# Patient Record
Sex: Male | Born: 1940
Health system: Southern US, Community
[De-identification: ages and names within clinical notes are randomized; demographics above are authoritative.]

## PROBLEM LIST (undated history)

## (undated) DIAGNOSIS — E785 Hyperlipidemia, unspecified: Secondary | ICD-10-CM

## (undated) DIAGNOSIS — F329 Major depressive disorder, single episode, unspecified: Secondary | ICD-10-CM

## (undated) DIAGNOSIS — E119 Type 2 diabetes mellitus without complications: Secondary | ICD-10-CM

## (undated) DIAGNOSIS — I4510 Unspecified right bundle-branch block: Secondary | ICD-10-CM

## (undated) DIAGNOSIS — J189 Pneumonia, unspecified organism: Secondary | ICD-10-CM

## (undated) DIAGNOSIS — N400 Enlarged prostate without lower urinary tract symptoms: Secondary | ICD-10-CM

## (undated) DIAGNOSIS — F419 Anxiety disorder, unspecified: Secondary | ICD-10-CM

## (undated) DIAGNOSIS — Z86010 Personal history of colonic polyps: Secondary | ICD-10-CM

## (undated) DIAGNOSIS — R399 Unspecified symptoms and signs involving the genitourinary system: Secondary | ICD-10-CM

## (undated) DIAGNOSIS — F32A Depression, unspecified: Secondary | ICD-10-CM

## (undated) DIAGNOSIS — M1990 Unspecified osteoarthritis, unspecified site: Secondary | ICD-10-CM

## (undated) DIAGNOSIS — G4733 Obstructive sleep apnea (adult) (pediatric): Secondary | ICD-10-CM

## (undated) DIAGNOSIS — K7460 Unspecified cirrhosis of liver: Secondary | ICD-10-CM

## (undated) DIAGNOSIS — I440 Atrioventricular block, first degree: Secondary | ICD-10-CM

## (undated) DIAGNOSIS — I10 Essential (primary) hypertension: Secondary | ICD-10-CM

## (undated) DIAGNOSIS — R351 Nocturia: Secondary | ICD-10-CM

## (undated) DIAGNOSIS — R600 Localized edema: Secondary | ICD-10-CM

## (undated) DIAGNOSIS — Z860101 Personal history of adenomatous and serrated colon polyps: Secondary | ICD-10-CM

## (undated) HISTORY — PX: TOTAL KNEE ARTHROPLASTY: SHX125

## (undated) HISTORY — DX: Essential (primary) hypertension: I10

## (undated) HISTORY — PX: UVULOPALATOPHARYNGOPLASTY: SHX827

## (undated) HISTORY — DX: Depression, unspecified: F32.A

## (undated) HISTORY — PX: SHOULDER ARTHROSCOPY: SHX128

## (undated) HISTORY — PX: PILONIDAL CYST EXCISION: SHX744

## (undated) HISTORY — DX: Hyperlipidemia, unspecified: E78.5

## (undated) HISTORY — PX: APPENDECTOMY: SHX54

## (undated) HISTORY — PX: NASAL SEPTUM SURGERY: SHX37

## (undated) HISTORY — DX: Anxiety disorder, unspecified: F41.9

## (undated) HISTORY — DX: Major depressive disorder, single episode, unspecified: F32.9

---
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
## 2011-12-19 ENCOUNTER — Encounter (HOSPITAL_COMMUNITY): Payer: Self-pay | Admitting: Pharmacy Technician

## 2011-12-27 ENCOUNTER — Ambulatory Visit (HOSPITAL_COMMUNITY)
Admission: RE | Admit: 2011-12-27 | Discharge: 2011-12-27 | Disposition: A | Discharge status: Home / Self Care | Payer: Medicare Other | Source: Ambulatory Visit | Attending: Orthopedic Surgery | Admitting: Orthopedic Surgery

## 2011-12-27 ENCOUNTER — Other Ambulatory Visit: Payer: Self-pay | Admitting: Physician Assistant

## 2011-12-27 ENCOUNTER — Encounter: Payer: Self-pay | Admitting: Physician Assistant

## 2011-12-27 DIAGNOSIS — R52 Pain, unspecified: Secondary | ICD-10-CM

## 2011-12-27 DIAGNOSIS — M7989 Other specified soft tissue disorders: Secondary | ICD-10-CM

## 2011-12-27 DIAGNOSIS — M79609 Pain in unspecified limb: Secondary | ICD-10-CM | POA: Insufficient documentation

## 2011-12-27 DIAGNOSIS — I10 Essential (primary) hypertension: Secondary | ICD-10-CM | POA: Insufficient documentation

## 2011-12-27 DIAGNOSIS — K219 Gastro-esophageal reflux disease without esophagitis: Secondary | ICD-10-CM | POA: Insufficient documentation

## 2011-12-27 DIAGNOSIS — F419 Anxiety disorder, unspecified: Secondary | ICD-10-CM | POA: Insufficient documentation

## 2011-12-27 DIAGNOSIS — G4730 Sleep apnea, unspecified: Secondary | ICD-10-CM | POA: Insufficient documentation

## 2011-12-27 DIAGNOSIS — R609 Edema, unspecified: Secondary | ICD-10-CM

## 2011-12-27 DIAGNOSIS — M1711 Unilateral primary osteoarthritis, right knee: Secondary | ICD-10-CM

## 2011-12-27 DIAGNOSIS — F329 Major depressive disorder, single episode, unspecified: Secondary | ICD-10-CM | POA: Insufficient documentation

## 2011-12-27 DIAGNOSIS — E785 Hyperlipidemia, unspecified: Secondary | ICD-10-CM | POA: Insufficient documentation

## 2011-12-27 DIAGNOSIS — F32A Depression, unspecified: Secondary | ICD-10-CM | POA: Insufficient documentation

## 2011-12-27 NOTE — Progress Notes (Signed)
Right lower extremity venous duplex completed.  Preliminary report is negative for DVT, SVT, or a Baker's cyst in the right leg.  Negative for DVT in the left common femoral vein. 

## 2011-12-27 NOTE — H&P (Signed)
Jared Martinez is an 71 y.o. male.   Chief Complaint: right knee pain and swelling HPI: Jared Martinez is a 60 seen for evaluation of significant right knee pain with known degenerative joint disease. He's been under the care of Dr. Charlett Blake for this but getting progressively worse with increased arthritis in the right knee to the point that it's difficult for him to walk. He has pain on a daily basis. Pain with weightbearing and activity relieved by rest. He has history of venous insufficiency on the right leg with chronic swelling and had a Doppler evaluation on 08/18/11 that showed no evidence of a DVT. He had a left total knee replacement in [place] in 2006.  Today he has significant increase in swelling.  He drove 500 miles yesterday.  No SOB.  Low grade fever.  Past Medical History  Diagnosis Date  . Hyperlipidemia   . Hypertension   . GERD (gastroesophageal reflux disease)   . Anxiety   . Sleep apnea     Does NOT use his CPAP  . Depression     Past Surgical History  Procedure Date  . Palate / uvula biopsy / excision   . Joint replacement 2006    left total knee in [place]  . Appendectomy     Family History  Problem Relation Age of Onset  . Alzheimer's disease Mother   . Cancer Father   . Arthritis Sister   . Arthritis Brother    Social History:  reports that he quit smoking about 38 years ago. He has never used smokeless tobacco. He reports that he drinks alcohol. He reports that he does not use illicit drugs.  Allergies:  Allergies  Allergen Reactions  . Adhesive (Tape)     Medications Prior to Admission  Medication Sig Dispense Refill  . amLODipine (NORVASC) 10 MG tablet Take 10 mg by mouth daily.      Marland Kitchen aspirin EC 81 MG tablet Take 81 mg by mouth daily.      Marland Kitchen atorvastatin (LIPITOR) 20 MG tablet Take 20 mg by mouth daily.      . cetirizine (ZYRTEC) 10 MG tablet Take 10 mg by mouth daily.      . citalopram (CELEXA) 10 MG tablet Take 10 mg by mouth daily.      Marland Kitchen  esomeprazole (NEXIUM) 40 MG capsule Take 40 mg by mouth daily before breakfast.      . lisinopril (PRINIVIL,ZESTRIL) 20 MG tablet Take 20 mg by mouth daily.      . mirtazapine (REMERON) 15 MG tablet Take 15 mg by mouth at bedtime.       No current facility-administered medications on file as of 12/27/2011.    No results found for this or any previous visit (from the past 48 hour(s)). No results found.  Review of Systems  Constitutional: Negative.   HENT: Negative.  Negative for neck pain.   Eyes: Negative for blurred vision, double vision, photophobia, pain, discharge and redness.  Respiratory: Positive for sputum production. Negative for cough, hemoptysis, shortness of breath and wheezing.   Cardiovascular: Positive for leg swelling. Negative for chest pain, palpitations, orthopnea and claudication.       Right leg 3 plus pitting edema on the right positive homan's    Gastrointestinal: Negative for heartburn, nausea, vomiting, abdominal pain, diarrhea, constipation, blood in stool and melena.  Genitourinary: Negative for dysuria, urgency, frequency, hematuria and flank pain.  Musculoskeletal: Positive for joint pain. Negative for myalgias, back pain and falls.  Right knee pain and swelling  Skin: Negative.   Neurological: Negative.  Negative for dizziness, tingling, tremors, sensory change, speech change, focal weakness, seizures and loss of consciousness.  Endo/Heme/Allergies: Negative.   Psychiatric/Behavioral: Negative.     Blood pressure 126/80, pulse 99, temperature 99 F (37.2 C), temperature source Oral, height 5\' 11"  (1.803 m), weight 111.131 kg (245 lb), SpO2 91.00%. Physical Exam  Constitutional: He is oriented to person, place, and time. He appears well-developed and well-nourished.  HENT:  Head: Normocephalic and atraumatic.  Mouth/Throat: Oropharynx is clear and moist.  Eyes: Conjunctivae and EOM are normal. Pupils are equal, round, and reactive to light.  Neck:  Normal range of motion. Neck supple.  Cardiovascular: Normal rate and regular rhythm.   Respiratory: Effort normal and breath sounds normal.  GI: Soft. Bowel sounds are normal.  Genitourinary:       Not pertinent to current symptomatology therefore not examined.  Musculoskeletal: He exhibits edema and tenderness.       Well developed well nourished white male in no acute distress. Alert and oriented. Height 5'11" weight 245 pounds, blood pressure 116/76 pulse is 87. Examination of his right knee reveals moderate varus deformity. 1 to 2+ crepitation 1+ synovitis range of motion 0-120 degrees knee is stable with diffuse pain and normal patella tracking. Exam of the left knee reveals well healed total knee incision without swelling or pain, range of motion 0-115 degrees knee is stable with normal patella tracking. Vascular exam: reveals 1+ pitting edema in the right leg which he says is chronic he has no swelling in the left leg. Pulses are 2+ and symmetric.  Patient has 3 plus pitting edema in his right leg today.  No swelling in his left leg  Neurological: He is alert and oriented to person, place, and time. He has normal reflexes.  Skin: Skin is warm and dry.  Psychiatric: He has a normal mood and affect. His behavior is normal. Judgment and thought content normal.     Assessment Patient Active Problem List  Diagnoses  . Hypertension  . GERD (gastroesophageal reflux disease)  . Hyperlipidemia  . Anxiety  . Sleep apnea  . Depression  . Right knee DJD    Plan  Doppler today to rule out a DVT.  If neg, then plan on total knee replacement on Monday.  I talked to him about this in detail and recommended with his significant persistent pain and findings on x-rays and exam that we proceed with right total knee replacement. Discussed risks benefits and possible complications in detail and he understand this completely. Skilled Nursing Post op  Pascal Lux 12/27/2011, 4:46 PM

## 2011-12-28 ENCOUNTER — Encounter (HOSPITAL_COMMUNITY)
Admission: RE | Admit: 2011-12-28 | Discharge: 2011-12-28 | Disposition: A | Discharge status: Home / Self Care | Payer: Medicare Other | Source: Ambulatory Visit | Attending: Physician Assistant | Admitting: Physician Assistant

## 2011-12-28 ENCOUNTER — Encounter (HOSPITAL_COMMUNITY)
Admission: RE | Admit: 2011-12-28 | Discharge: 2011-12-28 | Disposition: A | Discharge status: Home / Self Care | Payer: Medicare Other | Source: Ambulatory Visit | Attending: Orthopedic Surgery | Admitting: Orthopedic Surgery

## 2011-12-28 ENCOUNTER — Other Ambulatory Visit: Payer: Self-pay

## 2011-12-28 LAB — COMPREHENSIVE METABOLIC PANEL
ALT: 38 U/L (ref 0–53)
AST: 31 U/L (ref 0–37)
Albumin: 4.2 g/dL (ref 3.5–5.2)
Alkaline Phosphatase: 68 U/L (ref 39–117)
BUN: 18 mg/dL (ref 6–23)
CO2: 31 meq/L (ref 19–32)
Calcium: 9.5 mg/dL (ref 8.4–10.5)
Chloride: 102 meq/L (ref 96–112)
Creatinine, Ser: 1.18 mg/dL (ref 0.50–1.35)
GFR calc Af Amer: 69 mL/min — ABNORMAL LOW (ref >90)
GFR calc non Af Amer: 60 mL/min — ABNORMAL LOW (ref >90)
Glucose, Bld: 84 mg/dL (ref 70–99)
Potassium: 4.5 meq/L (ref 3.5–5.1)
Sodium: 140 meq/L (ref 135–145)
Total Bilirubin: 0.5 mg/dL (ref 0.3–1.2)
Total Protein: 7.7 g/dL (ref 6.0–8.3)

## 2011-12-28 LAB — DIFFERENTIAL
Basophils Absolute: 0 10*3/uL (ref 0.0–0.1)
Basophils Relative: 0 % (ref 0–1)
Eosinophils Absolute: 0.2 10*3/uL (ref 0.0–0.7)
Eosinophils Relative: 3 % (ref 0–5)
Lymphocytes Relative: 39 % (ref 12–46)
Lymphs Abs: 2.7 10*3/uL (ref 0.7–4.0)
Monocytes Absolute: 0.7 10*3/uL (ref 0.1–1.0)
Monocytes Relative: 11 % (ref 3–12)
Neutro Abs: 3.2 10*3/uL (ref 1.7–7.7)
Neutrophils Relative %: 47 % (ref 43–77)

## 2011-12-28 LAB — URINALYSIS, ROUTINE W REFLEX MICROSCOPIC
Bilirubin Urine: NEGATIVE
Glucose, UA: NEGATIVE mg/dL
Hgb urine dipstick: NEGATIVE
Ketones, ur: NEGATIVE mg/dL
Nitrite: NEGATIVE
Protein, ur: NEGATIVE mg/dL
Specific Gravity, Urine: 1.018 (ref 1.005–1.030)
Urobilinogen, UA: 1 mg/dL (ref 0.0–1.0)
pH: 7 (ref 5.0–8.0)

## 2011-12-28 LAB — TYPE AND SCREEN
ABO/RH(D): A POS
Antibody Screen: NEGATIVE

## 2011-12-28 LAB — SURGICAL PCR SCREEN
MRSA, PCR: NEGATIVE
Staphylococcus aureus: POSITIVE — AB

## 2011-12-28 LAB — CBC
HCT: 45.3 % (ref 39.0–52.0)
Hemoglobin: 16 g/dL (ref 13.0–17.0)
MCH: 32.6 pg (ref 26.0–34.0)
MCHC: 35.3 g/dL (ref 30.0–36.0)
MCV: 92.3 fL (ref 78.0–100.0)
Platelets: 144 10*3/uL — ABNORMAL LOW (ref 150–400)
RBC: 4.91 MIL/uL (ref 4.22–5.81)
RDW: 12.2 % (ref 11.5–15.5)
WBC: 7 10*3/uL (ref 4.0–10.5)

## 2011-12-28 LAB — URINE MICROSCOPIC-ADD ON

## 2011-12-28 LAB — ABO/RH: ABO/RH(D): A POS

## 2011-12-28 LAB — APTT: aPTT: 26 s (ref 24–37)

## 2011-12-28 LAB — PROTIME-INR
INR: 0.95 (ref 0.00–1.49)
Prothrombin Time: 12.9 s (ref 11.6–15.2)

## 2011-12-28 MED ORDER — CHLORHEXIDINE GLUCONATE 4 % EX LIQD: 60.0000 mL | Freq: Once | CUTANEOUS | Status: DC

## 2011-12-28 NOTE — Progress Notes (Signed)
Chart left for anesthesia review of CXR. 

## 2011-12-28 NOTE — Pre-Procedure Instructions (Signed)
20 MAXTEN SHULER  12/28/2011   Your procedure is scheduled on:  March 18 (Monday)  Report to [facility] at 8:30 AM.  Call this number if you have problems the morning of surgery: [phone]   Remember:   Do not eat food:After Midnight.  May have clear liquids: up to 4 Hours before arrival.  Clear liquids include soda, tea, black coffee, apple or grape juice, broth.  Take these medicines the morning of surgery with A SIP OF WATER: norvasc,zyrtec,celexa,nexium   Do not wear jewelry, make-up or nail polish.  Do not wear lotions, powders, or perfumes. You may wear deodorant.  Do not shave 48 hours prior to surgery.  Do not bring valuables to the hospital.  Contacts, dentures or bridgework may not be worn into surgery.  Leave suitcase in the car. After surgery it may be brought to your room.  For patients admitted to the hospital, checkout time is 11:00 AM the day of discharge.   Patients discharged the day of surgery will not be allowed to drive home.  Name and phone number of your driver: NA  Special Instructions: Incentive Spirometry - Practice and bring it with you on the day of surgery. and CHG Shower Use Special Wash: 1/2 bottle night before surgery and 1/2 bottle morning of surgery.   Please read over the following fact sheets that you were given: Pain Booklet, Blood Transfusion Information, Total Joint Packet, MRSA Information and Surgical Site Infection Prevention

## 2011-12-29 ENCOUNTER — Other Ambulatory Visit: Payer: Self-pay | Admitting: Orthopedic Surgery

## 2011-12-29 DIAGNOSIS — R600 Localized edema: Secondary | ICD-10-CM

## 2011-12-29 LAB — URINE CULTURE
Colony Count: NO GROWTH
Culture  Setup Time: 201303140225
Culture: NO GROWTH

## 2011-12-29 NOTE — Consult Note (Signed)
Anesthesia:  Patient is a 71 year old male scheduled for a right TKR on 01/02/12.  History includes HLD, HTN, GERD, anxiety, OSA without CPAP use s/p UPPP, former smoker, depression, s/p left TKR in '06.  Labs noted.  Urine culture pending.    CXR shows 1. Low lung volumes and bibasilar atelectasis.  Sats 94% at PAT.  No acute respiratory symptoms were reported at his PAT visit.  EKG shows NSR, right BBB.  Currently there are no comparison EKGs available.  No CV symptoms reported at his PAT visit.   If remains asymptomatic, then anticipate he can proceed.

## 2011-12-30 ENCOUNTER — Ambulatory Visit
Admission: RE | Admit: 2011-12-30 | Discharge: 2011-12-30 | Disposition: A | Discharge status: Home / Self Care | Payer: Medicare Other | Source: Ambulatory Visit | Attending: Orthopedic Surgery | Admitting: Orthopedic Surgery

## 2011-12-30 DIAGNOSIS — R600 Localized edema: Secondary | ICD-10-CM

## 2011-12-30 MED ORDER — IOHEXOL 300 MG/ML  SOLN
125.0000 mL | Freq: Once | INTRAMUSCULAR | Status: AC | PRN
  Administered 2011-12-30: 125 mL via INTRAVENOUS

## 2011-12-30 MED ORDER — IOHEXOL 300 MG/ML  SOLN
20.0000 mL | Freq: Once | INTRAMUSCULAR | Status: AC | PRN
  Administered 2011-12-30: 20 mL via ORAL

## 2012-01-01 MED ORDER — CEFAZOLIN SODIUM-DEXTROSE 2-3 GM-% IV SOLR: 2.0000 g | INTRAVENOUS | Status: DC

## 2012-01-02 ENCOUNTER — Inpatient Hospital Stay (HOSPITAL_COMMUNITY)
Admission: RE | Admit: 2012-01-02 | Discharge: 2012-01-05 | DRG: 470 | Disposition: A | Discharge status: Skilled Nursing Facility | Payer: Medicare Other | Source: Ambulatory Visit | Attending: Orthopedic Surgery | Admitting: Orthopedic Surgery

## 2012-01-02 ENCOUNTER — Encounter (HOSPITAL_COMMUNITY): Payer: Self-pay | Admitting: *Deleted

## 2012-01-02 ENCOUNTER — Ambulatory Visit (HOSPITAL_COMMUNITY): Payer: Medicare Other | Admitting: Vascular Surgery

## 2012-01-02 ENCOUNTER — Encounter (HOSPITAL_COMMUNITY): Payer: Self-pay | Admitting: Vascular Surgery

## 2012-01-02 ENCOUNTER — Encounter (HOSPITAL_COMMUNITY)
Admission: RE | Disposition: A | Discharge status: Skilled Nursing Facility | Payer: Self-pay | Source: Ambulatory Visit | Attending: Orthopedic Surgery

## 2012-01-02 DIAGNOSIS — F419 Anxiety disorder, unspecified: Secondary | ICD-10-CM | POA: Insufficient documentation

## 2012-01-02 DIAGNOSIS — Z96659 Presence of unspecified artificial knee joint: Secondary | ICD-10-CM

## 2012-01-02 DIAGNOSIS — I10 Essential (primary) hypertension: Secondary | ICD-10-CM | POA: Diagnosis present

## 2012-01-02 DIAGNOSIS — Z299 Encounter for prophylactic measures, unspecified: Secondary | ICD-10-CM

## 2012-01-02 DIAGNOSIS — G4730 Sleep apnea, unspecified: Secondary | ICD-10-CM | POA: Diagnosis present

## 2012-01-02 DIAGNOSIS — R339 Retention of urine, unspecified: Secondary | ICD-10-CM | POA: Diagnosis not present

## 2012-01-02 DIAGNOSIS — Z87891 Personal history of nicotine dependence: Secondary | ICD-10-CM

## 2012-01-02 DIAGNOSIS — K219 Gastro-esophageal reflux disease without esophagitis: Secondary | ICD-10-CM | POA: Insufficient documentation

## 2012-01-02 DIAGNOSIS — F411 Generalized anxiety disorder: Secondary | ICD-10-CM | POA: Diagnosis present

## 2012-01-02 DIAGNOSIS — E785 Hyperlipidemia, unspecified: Secondary | ICD-10-CM | POA: Diagnosis present

## 2012-01-02 DIAGNOSIS — Z7901 Long term (current) use of anticoagulants: Secondary | ICD-10-CM

## 2012-01-02 DIAGNOSIS — E871 Hypo-osmolality and hyponatremia: Secondary | ICD-10-CM | POA: Diagnosis not present

## 2012-01-02 DIAGNOSIS — M1710 Unilateral primary osteoarthritis, unspecified knee: Principal | ICD-10-CM | POA: Diagnosis present

## 2012-01-02 DIAGNOSIS — N9989 Other postprocedural complications and disorders of genitourinary system: Secondary | ICD-10-CM | POA: Diagnosis not present

## 2012-01-02 DIAGNOSIS — Z8261 Family history of arthritis: Secondary | ICD-10-CM

## 2012-01-02 DIAGNOSIS — D62 Acute posthemorrhagic anemia: Secondary | ICD-10-CM | POA: Diagnosis not present

## 2012-01-02 DIAGNOSIS — Z9109 Other allergy status, other than to drugs and biological substances: Secondary | ICD-10-CM

## 2012-01-02 DIAGNOSIS — F329 Major depressive disorder, single episode, unspecified: Secondary | ICD-10-CM | POA: Diagnosis present

## 2012-01-02 DIAGNOSIS — D696 Thrombocytopenia, unspecified: Secondary | ICD-10-CM | POA: Diagnosis present

## 2012-01-02 DIAGNOSIS — M1711 Unilateral primary osteoarthritis, right knee: Secondary | ICD-10-CM | POA: Insufficient documentation

## 2012-01-02 DIAGNOSIS — Z01812 Encounter for preprocedural laboratory examination: Secondary | ICD-10-CM

## 2012-01-02 DIAGNOSIS — Z809 Family history of malignant neoplasm, unspecified: Secondary | ICD-10-CM

## 2012-01-02 DIAGNOSIS — F3289 Other specified depressive episodes: Secondary | ICD-10-CM | POA: Diagnosis present

## 2012-01-02 DIAGNOSIS — IMO0001 Reserved for inherently not codable concepts without codable children: Secondary | ICD-10-CM

## 2012-01-02 DIAGNOSIS — IMO0002 Reserved for concepts with insufficient information to code with codable children: Secondary | ICD-10-CM | POA: Diagnosis not present

## 2012-01-02 HISTORY — PX: TOTAL KNEE ARTHROPLASTY: SHX125

## 2012-01-02 LAB — GLUCOSE, CAPILLARY
Glucose-Capillary: 148 mg/dL — ABNORMAL HIGH (ref 70–99)
Glucose-Capillary: 120 mg/dL — ABNORMAL HIGH (ref 70–99)

## 2012-01-02 SURGERY — Surgical Case
Anesthesia: *Unknown

## 2012-01-02 SURGERY — ARTHROPLASTY, KNEE, TOTAL
Anesthesia: Regional | Site: Knee | Laterality: Right | Wound class: Clean

## 2012-01-02 MED ORDER — DIAZEPAM 5 MG PO TABS
5.0000 mg | ORAL_TABLET | Freq: Four times a day (QID) | ORAL | Status: DC | PRN
Start: 2012-01-02 — End: 2012-01-05

## 2012-01-02 MED ORDER — CEFUROXIME SODIUM 1.5 G IJ SOLR
INTRAMUSCULAR | Status: DC | PRN
  Administered 2012-01-02: 1.5 g

## 2012-01-02 MED ORDER — MIDAZOLAM HCL 5 MG/5ML IJ SOLN
INTRAMUSCULAR | Status: DC | PRN
  Administered 2012-01-02: 1 mg via INTRAVENOUS

## 2012-01-02 MED ORDER — BISACODYL 5 MG PO TBEC
10.0000 mg | DELAYED_RELEASE_TABLET | Freq: Every day | ORAL | Status: DC
  Administered 2012-01-02 – 2012-01-03 (×2): 10 mg via ORAL
  Filled 2012-01-02 (×3): qty 2

## 2012-01-02 MED ORDER — PHENOL 1.4 % MT LIQD: 1.0000 | OROMUCOSAL | Status: DC | PRN

## 2012-01-02 MED ORDER — BUPIVACAINE-EPINEPHRINE PF 0.5-1:200000 % IJ SOLN
INTRAMUSCULAR | Status: DC | PRN
  Administered 2012-01-02: 30 mL

## 2012-01-02 MED ORDER — FENTANYL CITRATE 0.05 MG/ML IJ SOLN
INTRAMUSCULAR | Status: DC | PRN
  Administered 2012-01-02: 75 ug via INTRAVENOUS
  Administered 2012-01-02: 50 ug via INTRAVENOUS
  Administered 2012-01-02: 25 ug via INTRAVENOUS

## 2012-01-02 MED ORDER — SIMVASTATIN 40 MG PO TABS: 40.0000 mg | ORAL_TABLET | Freq: Every day | ORAL | Status: DC

## 2012-01-02 MED ORDER — ONDANSETRON HCL 4 MG/2ML IJ SOLN
INTRAMUSCULAR | Status: DC | PRN
  Administered 2012-01-02: 4 mg via INTRAVENOUS

## 2012-01-02 MED ORDER — PROPOFOL 10 MG/ML IV EMUL
INTRAVENOUS | Status: DC | PRN
  Administered 2012-01-02: 200 mg via INTRAVENOUS

## 2012-01-02 MED ORDER — ENOXAPARIN SODIUM 30 MG/0.3ML ~~LOC~~ SOLN
30.0000 mg | Freq: Two times a day (BID) | SUBCUTANEOUS | Status: DC
  Administered 2012-01-02: 30 mg via SUBCUTANEOUS
  Filled 2012-01-02 (×3): qty 0.3

## 2012-01-02 MED ORDER — OXYCODONE-ACETAMINOPHEN 5-325 MG PO TABS
1.0000 | ORAL_TABLET | ORAL | Status: DC | PRN
  Administered 2012-01-03 (×2): 2 via ORAL
  Administered 2012-01-04: 1 via ORAL
  Filled 2012-01-02: qty 1
  Filled 2012-01-02 (×2): qty 2

## 2012-01-02 MED ORDER — ONDANSETRON HCL 4 MG/2ML IJ SOLN
4.0000 mg | Freq: Four times a day (QID) | INTRAMUSCULAR | Status: DC | PRN
  Administered 2012-01-03: 4 mg via INTRAVENOUS
  Filled 2012-01-02 (×2): qty 2

## 2012-01-02 MED ORDER — ACETAMINOPHEN 650 MG RE SUPP: 650.0000 mg | Freq: Four times a day (QID) | RECTAL | Status: DC | PRN

## 2012-01-02 MED ORDER — METOCLOPRAMIDE HCL 10 MG PO TABS
5.0000 mg | ORAL_TABLET | Freq: Three times a day (TID) | ORAL | Status: DC | PRN
  Administered 2012-01-04 (×2): 10 mg via ORAL
  Filled 2012-01-02: qty 1

## 2012-01-02 MED ORDER — METOCLOPRAMIDE HCL 5 MG/ML IJ SOLN: 5.0000 mg | Freq: Three times a day (TID) | INTRAMUSCULAR | Status: DC | PRN

## 2012-01-02 MED ORDER — ZOLPIDEM TARTRATE 10 MG PO TABS: 10.0000 mg | ORAL_TABLET | Freq: Every evening | ORAL | Status: DC | PRN

## 2012-01-02 MED ORDER — CEFAZOLIN SODIUM 1-5 GM-% IV SOLN
INTRAVENOUS | Status: DC | PRN
  Administered 2012-01-02: 2 g via INTRAVENOUS

## 2012-01-02 MED ORDER — LACTATED RINGERS IV SOLN: INTRAVENOUS | Status: DC

## 2012-01-02 MED ORDER — EPHEDRINE SULFATE 50 MG/ML IJ SOLN
INTRAMUSCULAR | Status: DC | PRN
  Administered 2012-01-02 (×4): 5 mg via INTRAVENOUS

## 2012-01-02 MED ORDER — PANTOPRAZOLE SODIUM 40 MG PO TBEC
80.0000 mg | DELAYED_RELEASE_TABLET | Freq: Every day | ORAL | Status: DC
  Administered 2012-01-02 – 2012-01-05 (×4): 80 mg via ORAL
  Filled 2012-01-02: qty 2
  Filled 2012-01-02 (×2): qty 1
  Filled 2012-01-02 (×2): qty 2

## 2012-01-02 MED ORDER — HYDROMORPHONE HCL PF 1 MG/ML IJ SOLN
0.2500 mg | INTRAMUSCULAR | Status: DC | PRN
  Administered 2012-01-02: 0.5 mg via INTRAVENOUS

## 2012-01-02 MED ORDER — LIDOCAINE HCL (CARDIAC) 20 MG/ML IV SOLN
INTRAVENOUS | Status: DC | PRN
  Administered 2012-01-02: 50 mg via INTRAVENOUS

## 2012-01-02 MED ORDER — SODIUM CHLORIDE 0.9 % IR SOLN
Status: DC | PRN
  Administered 2012-01-02: 1000 mL

## 2012-01-02 MED ORDER — ZOLPIDEM TARTRATE 5 MG PO TABS: 5.0000 mg | ORAL_TABLET | Freq: Every evening | ORAL | Status: DC | PRN

## 2012-01-02 MED ORDER — LACTATED RINGERS IV SOLN
INTRAVENOUS | Status: DC | PRN
  Administered 2012-01-02 (×2): via INTRAVENOUS

## 2012-01-02 MED ORDER — CEFAZOLIN SODIUM-DEXTROSE 2-3 GM-% IV SOLR
2.0000 g | Freq: Four times a day (QID) | INTRAVENOUS | Status: AC
  Administered 2012-01-02 – 2012-01-03 (×3): 2 g via INTRAVENOUS
  Filled 2012-01-02 (×4): qty 50

## 2012-01-02 MED ORDER — ATORVASTATIN CALCIUM 20 MG PO TABS
20.0000 mg | ORAL_TABLET | Freq: Every day | ORAL | Status: DC
  Administered 2012-01-02 – 2012-01-04 (×3): 20 mg via ORAL
  Filled 2012-01-02 (×4): qty 1

## 2012-01-02 MED ORDER — POTASSIUM CHLORIDE IN NACL 20-0.9 MEQ/L-% IV SOLN
INTRAVENOUS | Status: DC
  Administered 2012-01-02: 21:00:00 via INTRAVENOUS
  Filled 2012-01-02 (×13): qty 1000

## 2012-01-02 MED ORDER — MENTHOL 3 MG MT LOZG: 1.0000 | LOZENGE | OROMUCOSAL | Status: DC | PRN

## 2012-01-02 MED ORDER — AMLODIPINE BESYLATE 10 MG PO TABS
10.0000 mg | ORAL_TABLET | Freq: Every day | ORAL | Status: DC
  Filled 2012-01-02 (×4): qty 1

## 2012-01-02 MED ORDER — DOCUSATE SODIUM 100 MG PO CAPS
100.0000 mg | ORAL_CAPSULE | Freq: Two times a day (BID) | ORAL | Status: DC
  Administered 2012-01-02 – 2012-01-05 (×7): 100 mg via ORAL
  Filled 2012-01-02 (×8): qty 1

## 2012-01-02 MED ORDER — LORATADINE 10 MG PO TABS
10.0000 mg | ORAL_TABLET | Freq: Every day | ORAL | Status: DC
  Administered 2012-01-02 – 2012-01-05 (×4): 10 mg via ORAL
  Filled 2012-01-02 (×4): qty 1

## 2012-01-02 MED ORDER — POVIDONE-IODINE 7.5 % EX SOLN
Freq: Once | CUTANEOUS | Status: DC
  Filled 2012-01-02: qty 118

## 2012-01-02 MED ORDER — CITALOPRAM HYDROBROMIDE 10 MG PO TABS
10.0000 mg | ORAL_TABLET | Freq: Every day | ORAL | Status: DC
  Administered 2012-01-02 – 2012-01-05 (×4): 10 mg via ORAL
  Filled 2012-01-02 (×4): qty 1

## 2012-01-02 MED ORDER — ONDANSETRON HCL 4 MG PO TABS: 4.0000 mg | ORAL_TABLET | Freq: Four times a day (QID) | ORAL | Status: DC | PRN

## 2012-01-02 MED ORDER — MIRTAZAPINE 15 MG PO TABS
15.0000 mg | ORAL_TABLET | Freq: Every day | ORAL | Status: DC
  Administered 2012-01-02 – 2012-01-04 (×3): 15 mg via ORAL
  Filled 2012-01-02 (×4): qty 1

## 2012-01-02 MED ORDER — ACETAMINOPHEN 325 MG PO TABS: 650.0000 mg | ORAL_TABLET | Freq: Four times a day (QID) | ORAL | Status: DC | PRN

## 2012-01-02 MED ORDER — ACETAMINOPHEN 10 MG/ML IV SOLN
INTRAVENOUS | Status: DC | PRN
  Administered 2012-01-02: 1000 mg via INTRAVENOUS

## 2012-01-02 MED ORDER — HYDROMORPHONE HCL PF 1 MG/ML IJ SOLN
0.5000 mg | INTRAMUSCULAR | Status: DC | PRN
  Administered 2012-01-03: 1 mg via INTRAVENOUS
  Filled 2012-01-02: qty 1

## 2012-01-02 MED ORDER — LISINOPRIL 20 MG PO TABS
20.0000 mg | ORAL_TABLET | Freq: Every day | ORAL | Status: DC
  Filled 2012-01-02 (×4): qty 1

## 2012-01-02 MED ORDER — OXYCODONE HCL 5 MG PO TABS
5.0000 mg | ORAL_TABLET | ORAL | Status: DC | PRN
  Administered 2012-01-02 – 2012-01-03 (×4): 10 mg via ORAL
  Filled 2012-01-02 (×4): qty 2

## 2012-01-02 MED ORDER — ACETAMINOPHEN 10 MG/ML IV SOLN
INTRAVENOUS | Status: AC
  Filled 2012-01-02: qty 100

## 2012-01-02 SURGICAL SUPPLY — 74 items
BANDAGE ELASTIC 6 VELCRO ST LF (GAUZE/BANDAGES/DRESSINGS) ×2 IMPLANT
BANDAGE ESMARK 6X9 LF (GAUZE/BANDAGES/DRESSINGS) ×1 IMPLANT
BLADE SAGITTAL 25.0X1.19X90 (BLADE) ×2 IMPLANT
BLADE SAW SGTL 11.0X1.19X90.0M (BLADE) IMPLANT
BLADE SAW SGTL 13.0X1.19X90.0M (BLADE) ×2 IMPLANT
BLADE SURG 10 STRL SS (BLADE) ×4 IMPLANT
BNDG CMPR 9X6 STRL LF SNTH (GAUZE/BANDAGES/DRESSINGS) ×1
BNDG CMPR MED 15X6 ELC VLCR LF (GAUZE/BANDAGES/DRESSINGS) ×1
BNDG ELASTIC 6X15 VLCR STRL LF (GAUZE/BANDAGES/DRESSINGS) ×2 IMPLANT
BNDG ESMARK 6X9 LF (GAUZE/BANDAGES/DRESSINGS) ×2
BOWL SMART MIX CTS (DISPOSABLE) ×2 IMPLANT
CEMENT HV SMART SET (Cement) ×4 IMPLANT
CLOTH BEACON ORANGE TIMEOUT ST (SAFETY) ×2 IMPLANT
COVER BACK TABLE 24X17X13 BIG (DRAPES) IMPLANT
COVER PROBE W GEL 5X96 (DRAPES) IMPLANT
COVER SURGICAL LIGHT HANDLE (MISCELLANEOUS) ×2 IMPLANT
CUFF TOURNIQUET SINGLE 34IN LL (TOURNIQUET CUFF) ×2 IMPLANT
CUFF TOURNIQUET SINGLE 44IN (TOURNIQUET CUFF) IMPLANT
DRAPE EXTREMITY T 121X128X90 (DRAPE) ×2 IMPLANT
DRAPE INCISE IOBAN 66X45 STRL (DRAPES) ×2 IMPLANT
DRAPE PROXIMA HALF (DRAPES) ×2 IMPLANT
DRAPE U-SHAPE 47X51 STRL (DRAPES) ×2 IMPLANT
DRSG ADAPTIC 3X8 NADH LF (GAUZE/BANDAGES/DRESSINGS) ×2 IMPLANT
DRSG PAD ABDOMINAL 8X10 ST (GAUZE/BANDAGES/DRESSINGS) ×2 IMPLANT
DURAPREP 26ML APPLICATOR (WOUND CARE) ×2 IMPLANT
ELECT CAUTERY BLADE 6.4 (BLADE) ×2 IMPLANT
ELECT REM PT RETURN 9FT ADLT (ELECTROSURGICAL) ×2
ELECTRODE REM PT RTRN 9FT ADLT (ELECTROSURGICAL) ×1 IMPLANT
EVACUATOR 1/8 PVC DRAIN (DRAIN) ×2 IMPLANT
FACESHIELD LNG OPTICON STERILE (SAFETY) ×2 IMPLANT
GLOVE BIO SURGEON STRL SZ7 (GLOVE) IMPLANT
GLOVE BIOGEL PI IND STRL 7.0 (GLOVE) ×1 IMPLANT
GLOVE BIOGEL PI IND STRL 7.5 (GLOVE) ×1 IMPLANT
GLOVE BIOGEL PI INDICATOR 7.0 (GLOVE) ×1
GLOVE BIOGEL PI INDICATOR 7.5 (GLOVE) ×1
GLOVE SS BIOGEL STRL SZ 7.5 (GLOVE) IMPLANT
GLOVE SUPERSENSE BIOGEL SZ 7.5 (GLOVE)
GLOVE SURG SS PI 7.0 STRL IVOR (GLOVE) ×8 IMPLANT
GLOVE SURG SS PI 7.5 STRL IVOR (GLOVE) ×6 IMPLANT
GLOVE SURG SS PI 8.0 STRL IVOR (GLOVE) ×4 IMPLANT
GOWN PREVENTION PLUS XLARGE (GOWN DISPOSABLE) ×6 IMPLANT
GOWN STRL NON-REIN LRG LVL3 (GOWN DISPOSABLE) ×4 IMPLANT
HANDPIECE INTERPULSE COAX TIP (DISPOSABLE) ×2
HOOD PEEL AWAY FACE SHEILD DIS (HOOD) ×4 IMPLANT
IMMOBILIZER KNEE 22 UNIV (SOFTGOODS) IMPLANT
INSERT CUSHION PRONEVIEW LG (MISCELLANEOUS) IMPLANT
KIT BASIN OR (CUSTOM PROCEDURE TRAY) ×2 IMPLANT
KIT ROOM TURNOVER OR (KITS) ×2 IMPLANT
MANIFOLD NEPTUNE II (INSTRUMENTS) ×2 IMPLANT
NS IRRIG 1000ML POUR BTL (IV SOLUTION) ×2 IMPLANT
PACK TOTAL JOINT (CUSTOM PROCEDURE TRAY) ×2 IMPLANT
PAD ARMBOARD 7.5X6 YLW CONV (MISCELLANEOUS) ×4 IMPLANT
PAD CAST 4YDX4 CTTN HI CHSV (CAST SUPPLIES) ×1 IMPLANT
PADDING CAST COTTON 4X4 STRL (CAST SUPPLIES) ×2
PADDING CAST COTTON 6X4 STRL (CAST SUPPLIES) ×2 IMPLANT
POSITIONER HEAD PRONE TRACH (MISCELLANEOUS) ×2 IMPLANT
RUBBERBAND STERILE (MISCELLANEOUS) ×2 IMPLANT
SET HNDPC FAN SPRY TIP SCT (DISPOSABLE) ×1 IMPLANT
SPONGE GAUZE 4X4 12PLY (GAUZE/BANDAGES/DRESSINGS) ×2 IMPLANT
STRIP CLOSURE SKIN 1/2X4 (GAUZE/BANDAGES/DRESSINGS) ×2 IMPLANT
SUCTION FRAZIER TIP 10 FR DISP (SUCTIONS) ×2 IMPLANT
SUT ETHIBOND NAB CT1 #1 30IN (SUTURE) ×4 IMPLANT
SUT MNCRL AB 3-0 PS2 18 (SUTURE) ×2 IMPLANT
SUT VIC AB 0 CT1 27 (SUTURE) ×4
SUT VIC AB 0 CT1 27XBRD ANBCTR (SUTURE) ×2 IMPLANT
SUT VIC AB 2-0 CT1 27 (SUTURE) ×4
SUT VIC AB 2-0 CT1 TAPERPNT 27 (SUTURE) ×2 IMPLANT
SYR 30ML SLIP (SYRINGE) ×2 IMPLANT
TOWEL OR 17X24 6PK STRL BLUE (TOWEL DISPOSABLE) ×2 IMPLANT
TOWEL OR 17X26 10 PK STRL BLUE (TOWEL DISPOSABLE) ×2 IMPLANT
TRAY FOLEY BAG SILVER LF 14FR (CATHETERS) ×2 IMPLANT
TRAY FOLEY CATH 14FR (SET/KITS/TRAYS/PACK) ×2 IMPLANT
WATER STERILE IRR 1000ML POUR (IV SOLUTION) ×6 IMPLANT
YANKAUER SUCT BULB TIP NO VENT (SUCTIONS) ×2 IMPLANT

## 2012-01-02 NOTE — Anesthesia Procedure Notes (Addendum)
Anesthesia Regional Block:  Femoral nerve block  Pre-Anesthetic Checklist: ,, timeout performed, Correct Patient, Correct Site, Correct Laterality, Correct Procedure, Correct Position, site marked, Risks and benefits discussed, pre-op evaluation,  At surgeon's request and post-op pain management  Laterality: Right  Prep: Maximum Sterile Barrier Precautions used and chloraprep       Needles:  Injection technique: Single-shot  Needle Type: Echogenic Stimulator Needle      Needle Gauge: 22 and 22 G    Additional Needles:  Procedures: nerve stimulator Femoral nerve block  Nerve Stimulator or Paresthesia:  Response: Patellar respose, 0.4 mA,   Additional Responses:   Narrative:  Start time: 01/02/2012 8:55 AM End time: 01/02/2012 9:05 AM Injection made incrementally with aspirations every 5 mL. Anesthesiologist: Fitzgerald,MD  Additional Notes: 2% Lidocaine skin wheel.   Femoral nerve block Procedure Name: LMA Insertion Date/Time: 01/02/2012 9:42 AM Performed by: Glendora Score A Pre-anesthesia Checklist: Patient identified, Emergency Drugs available, Suction available and Patient being monitored Patient Re-evaluated:Patient Re-evaluated prior to inductionOxygen Delivery Method: Circle system utilized Preoxygenation: Pre-oxygenation with 100% oxygen Intubation Type: IV induction LMA: LMA with gastric port inserted LMA Size: 5.0 Placement Confirmation: positive ETCO2 and breath sounds checked- equal and bilateral Tube secured with: Tape Dental Injury: Teeth and Oropharynx as per pre-operative assessment

## 2012-01-02 NOTE — Anesthesia Postprocedure Evaluation (Signed)
  Anesthesia Post-op Note  Patient: Jared Martinez  Procedure(s) Performed: Procedure(s) (LRB): TOTAL KNEE ARTHROPLASTY (Right)  Patient Location: PACU  Anesthesia Type: GA combined with regional for post-op pain  Level of Consciousness: awake and alert   Airway and Oxygen Therapy: Patient Spontanous Breathing and Patient connected to nasal cannula oxygen  Post-op Pain: moderate  Post-op Assessment: Post-op Vital signs reviewed, Patient's Cardiovascular Status Stable, Respiratory Function Stable, Patent Airway and No signs of Nausea or vomiting  Post-op Vital Signs: Reviewed and stable  Complications: No apparent anesthesia complications

## 2012-01-02 NOTE — Preoperative (Signed)
Beta Blockers   Reason not to administer Beta Blockers:Not Applicable 

## 2012-01-02 NOTE — Progress Notes (Signed)
Orthopedic Tech Progress Note Patient Details:  Jared Martinez 06/15/1941 [id number]  Patient ID: Jared Martinez, male   DOB: 12-08-1940, 71 y.o.   MRN: [id number]  Applied overhead frame to pt bed.  Tavoris Brisk T 01/02/2012, 4:07 PM

## 2012-01-02 NOTE — Anesthesia Preprocedure Evaluation (Addendum)
Anesthesia Evaluation  Patient identified by MRN, date of birth, ID band Patient awake    Reviewed: Allergy & Precautions, H&P , NPO status , Patient's Chart, lab work & pertinent test results, reviewed documented beta blocker date and time   History of Anesthesia Complications Negative for: history of anesthetic complications  Airway Mallampati: III TM Distance: >3 FB Neck ROM: full    Dental No notable dental hx. (+) Teeth Intact   Pulmonary sleep apnea ,  breath sounds clear to auscultation  Pulmonary exam normal       Cardiovascular Exercise Tolerance: Good hypertension, On Medications Rhythm:regular Rate:Normal     Neuro/Psych PSYCHIATRIC DISORDERS negative neurological ROS  negative psych ROS   GI/Hepatic Neg liver ROS, GERD-  Controlled and Medicated,  Endo/Other  negative endocrine ROS  Renal/GU negative Renal ROS  negative genitourinary   Musculoskeletal   Abdominal (+) + obese,   Peds  Hematology negative hematology ROS (+)   Anesthesia Other Findings   Reproductive/Obstetrics negative OB ROS                          Anesthesia Physical Anesthesia Plan  ASA: III  Anesthesia Plan: General ETT and Regional   Post-op Pain Management:    Induction: Intravenous  Airway Management Planned: LMA  Additional Equipment:   Intra-op Plan:   Post-operative Plan: Extubation in OR  Informed Consent: I have reviewed the patients History and Physical, chart, labs and discussed the procedure including the risks, benefits and alternatives for the proposed anesthesia with the patient or authorized representative who has indicated his/her understanding and acceptance.   Dental Advisory Given  Plan Discussed with: CRNA and Surgeon  Anesthesia Plan Comments:        Anesthesia Quick Evaluation

## 2012-01-02 NOTE — Brief Op Note (Signed)
01/02/2012  11:20 AM  PATIENT:  Jared Martinez  71 y.o. male  PRE-OPERATIVE DIAGNOSIS:  DJD RT KNEE  POST-OPERATIVE DIAGNOSIS:  DJD RT KNEE  PROCEDURE:  Procedure(s) (LRB): TOTAL KNEE ARTHROPLASTY (Right)  SURGEON:  Surgeon(s) and Role:    * Nilda Simmer, MD - Primary  PHYSICIAN ASSISTANT: Julien Girt PA_C  ASSISTANTS: Kirstin Shepperson PA_C   ANESTHESIA:   general  EBL:  Total I/O In: 1000 [I.V.:1000] Out: -   BLOOD ADMINISTERED:none  DRAINS: (2) Hemovact drain(s) in the right knee with  Suction Clamped   LOCAL MEDICATIONS USED:  NONE  SPECIMEN:  No Specimen  DISPOSITION OF SPECIMEN:  N/A  COUNTS:  YES  TOURNIQUET:   Total Tourniquet Time Documented: Thigh (Right) - 62 minutes  DICTATION: .Other Dictation: Dictation Number [id number]  PLAN OF CARE: Admit to inpatient   PATIENT DISPOSITION:  PACU - hemodynamically stable.   Delay start of Pharmacological VTE agent (>24hrs) due to surgical blood loss or risk of bleeding: no

## 2012-01-02 NOTE — Transfer of Care (Signed)
Immediate Anesthesia Transfer of Care Note  Patient: Jared Martinez  Procedure(s) Performed: Procedure(s) (LRB): TOTAL KNEE ARTHROPLASTY (Right)  Patient Location: PACU  Anesthesia Type: General  Level of Consciousness: awake and alert   Airway & Oxygen Therapy: Patient Spontanous Breathing and Patient connected to face mask oxygen  Post-op Assessment: Report given to PACU RN  Post vital signs: Reviewed and stable  Complications: No apparent anesthesia complications

## 2012-01-02 NOTE — Progress Notes (Signed)
Orthopedic Tech Progress Note Patient Details:  Jared Martinez Sep 17, 1941 [id number]  CPM Right Knee CPM Right Knee: On Right Knee Flexion (Degrees): 90  Right Knee Extension (Degrees): 0    Cammer, Mickie Bail 01/02/2012, 12:27 PM

## 2012-01-02 NOTE — Interval H&P Note (Signed)
History and Physical Interval Note:  01/02/2012 9:23 AM  Jared Martinez  has presented today for surgery, with the diagnosis of DJD RT KNEE  The various methods of treatment have been discussed with the patient and family. After consideration of risks, benefits and other options for treatment, the patient has consented to  Procedure(s) (LRB): TOTAL KNEE ARTHROPLASTY (Right) as a surgical intervention .  The patients' history has been reviewed, patient examined, no change in status, stable for surgery.  I have reviewed the patients' chart and labs.  Questions were answered to the patient's satisfaction.     Salvatore Marvel A

## 2012-01-02 NOTE — Op Note (Signed)
NAME:  Jared Martinez, Jared Martinez NO.:  [id number]  MEDICAL RECORD NO.:  [id number]  LOCATION:  5037                         FACILITY:  [facility]  PHYSICIAN:  Elana Alm. Thurston Hole, M.D. DATE OF BIRTH:  June 05, 1941  DATE OF PROCEDURE:  01/02/2012 DATE OF DISCHARGE:                              OPERATIVE REPORT   PREOPERATIVE DIAGNOSIS:  Right knee degenerative joint disease.  POSTOPERATIVE DIAGNOSIS:  Right knee degenerative joint disease.  PROCEDURE: 1. Right total knee replacement using DePuy cemented total knee system     with 4 cemented femur, #5 cemented tibia with 12.5 mm polyethylene     RP tibial spacer, and 35 mm polyethylene cemented patella. 2. Zinacef impregnated cement.  SURGEON:  Elana Alm. Thurston Hole, M.D.  ASSISTANT:  Kirstin Shepperson, PA-C.  ANESTHESIA:  General.  OPERATIVE TIME:  1 hour and 20 minutes.  COMPLICATIONS:  None.  DESCRIPTION OF PROCEDURE:  Mr. Hands was brought to the operating room on January 02, 2012, after a femoral nerve block was placed in the holding by Anesthesia.  He was placed on operative table in supine position.  He received Ancef 2 g IV preoperatively for prophylaxis.  After being placed under general anesthesia, he had a Foley catheter placed under sterile conditions.  His right knee was examined.  Range of motion from -5 to 125 degrees, moderate varus deformity, knee stable, ligamentous exam with normal patellar tracking.  The right leg was prepped using sterile DuraPrep and draped using sterile technique.  Time-out procedure was called and the correct right knee identified.  Latex precautions were used to a questionable latex allergy.  The right leg was exsanguinated and a thigh tourniquet elevated at 365 mm.  Initially, through a 15-cm longitudinal incision based over the patella, initial exposure was made.  The underlying subcutaneous tissues were incised along with skin incision.  A median arthrotomy was performed  revealing excessive amount of normal-appearing joint fluid.  The articular surfaces were inspected.  He had grade 4 changes medially, grade 3 changes laterally, and grade 3 and 4 changes in the patellofemoral joint.  Osteophytes were removed from the femoral condyles and tibial plateau.  The medial and lateral meniscal remnants were removed as well as the anterior cruciate ligament.  Intramedullary drill was then drilled up the femoral canal for placement of distal femoral cutting jig, which was placed in the appropriate manner of rotation and a distal 11 mm cut was made.  The distal femur was then sized.  A #4 was found to be the appropriate size.  A #4 cutting jig was placed in the appropriate manner of external rotation and then these cuts were made.  The proximal tibia was then exposed.  The tibial spines were removed with an oscillating saw.  Intramedullary drill was drilled down the tibial canal for placement of proximal tibial cutting jig, which was placed in the appropriate manner of rotation and a proximal 5 mm cut was made based off the medial or lower side.  Spacer blocks were then placed in flexion and extension.  12.5 mm blocks gave excellent balancing, excellent stability, and excellent correction of his flexion and varus deformities.  At this point, #5  tibial baseplate trial was placed on the cut tibial surface with an excellent fit and a keel cut was made.  The PCL box cutter was then placed on the distal femur and these cuts were made.  At this point, #4 femoral trial was placed and with a #5 tibial baseplate trial and a 12.5 mm polyethylene RP tibial spacer, knee was reduced, taken through range of motion from 0 to 125 degrees with excellent stability and excellent correction of his flexion and varus deformities and normal patellar tracking.  A resurfacing 9 mm cut was made on the patella and 3 locking holes placed for a 35 mm polyethylene patellar trial and again,  patellofemoral tracking was evaluated and found to be normal.  At this point, it was felt that all the trial components were of excellent size, fit, and stability.  They were then removed.  The knee was then jet lavage irrigated with 3 L of saline. The proximal tibia was then exposed, #5 tibial baseplate with Zinacef impregnated cement was hammered in position with an excellent fit with excess cement being removed from around the edges.  #4 femoral component with cement backing was hammered in position also with an excellent fit with excess cement being removed from around the edges.  The 12.5 mm polyethylene RP tibial spacer was placed on tibial baseplate and the knee reduced, taken through range of motion from 0 to 125 degrees with excellent stability and excellent correction of his flexion and varus deformities.  The 35 mm polyethylene cement backed patella was then placed in its position and held there with a clamp.  After the cement hardened, again patellofemoral tracking was evaluated and found to be normal.  At this point, it was felt that all the components were of excellent size, fit, and stability.  The wound was further irrigated with saline.  Tourniquet was released.  Hemostasis obtained with cautery.  The arthrotomy was then closed with 0 Quill running suture. Subcutaneous tissues closed with 0 and 2-0 Vicryl, subcuticular layer closed with 4-0 Monocryl.  Sterile dressings were applied and a long-leg splint.  The patient awakened, extubated, and taken to recovery room in stable condition.  Needle, sponge counts were correct x2 at the end of the case.  Neurovascular status normal.  Pulses 2+ and symmetric.     Jemel Ono A. Thurston Hole, M.D.     RAW/MEDQ  D:  01/02/2012  T:  01/02/2012  Job:  [id number]

## 2012-01-02 NOTE — H&P (View-Only) (Signed)
Jared Martinez is an 71 y.o. male.   Chief Complaint: right knee pain and swelling HPI: Jared Martinez is a 71 seen for evaluation of significant right knee pain with known degenerative joint disease. He's been under the care of Dr. Voytek for this but getting progressively worse with increased arthritis in the right knee to the point that it's difficult for him to walk. He has pain on a daily basis. Pain with weightbearing and activity relieved by rest. He has history of venous insufficiency on the right leg with chronic swelling and had a Doppler evaluation on 08/18/11 that showed no evidence of a DVT. He had a left total knee replacement in [place] in 2006.  Today he has significant increase in swelling.  He drove 500 miles yesterday.  No SOB.  Low grade fever.  Past Medical History  Diagnosis Date  . Hyperlipidemia   . Hypertension   . GERD (gastroesophageal reflux disease)   . Anxiety   . Sleep apnea     Does NOT use his CPAP  . Depression     Past Surgical History  Procedure Date  . Palate / uvula biopsy / excision   . Joint replacement 2006    left total knee in [place]  . Appendectomy     Family History  Problem Relation Age of Onset  . Alzheimer's disease Mother   . Cancer Father   . Arthritis Sister   . Arthritis Brother    Social History:  reports that he quit smoking about 38 years ago. He has never used smokeless tobacco. He reports that he drinks alcohol. He reports that he does not use illicit drugs.  Allergies:  Allergies  Allergen Reactions  . Adhesive (Tape)     Medications Prior to Admission  Medication Sig Dispense Refill  . amLODipine (NORVASC) 10 MG tablet Take 10 mg by mouth daily.      . aspirin EC 81 MG tablet Take 81 mg by mouth daily.      . atorvastatin (LIPITOR) 20 MG tablet Take 20 mg by mouth daily.      . cetirizine (ZYRTEC) 10 MG tablet Take 10 mg by mouth daily.      . citalopram (CELEXA) 10 MG tablet Take 10 mg by mouth daily.      .  esomeprazole (NEXIUM) 40 MG capsule Take 40 mg by mouth daily before breakfast.      . lisinopril (PRINIVIL,ZESTRIL) 20 MG tablet Take 20 mg by mouth daily.      . mirtazapine (REMERON) 15 MG tablet Take 15 mg by mouth at bedtime.       No current facility-administered medications on file as of 12/27/2011.    No results found for this or any previous visit (from the past 48 hour(s)). No results found.  Review of Systems  Constitutional: Negative.   HENT: Negative.  Negative for neck pain.   Eyes: Negative for blurred vision, double vision, photophobia, pain, discharge and redness.  Respiratory: Positive for sputum production. Negative for cough, hemoptysis, shortness of breath and wheezing.   Cardiovascular: Positive for leg swelling. Negative for chest pain, palpitations, orthopnea and claudication.       Right leg 3 plus pitting edema on the right positive homan's    Gastrointestinal: Negative for heartburn, nausea, vomiting, abdominal pain, diarrhea, constipation, blood in stool and melena.  Genitourinary: Negative for dysuria, urgency, frequency, hematuria and flank pain.  Musculoskeletal: Positive for joint pain. Negative for myalgias, back pain and falls.         Right knee pain and swelling  Skin: Negative.   Neurological: Negative.  Negative for dizziness, tingling, tremors, sensory change, speech change, focal weakness, seizures and loss of consciousness.  Endo/Heme/Allergies: Negative.   Psychiatric/Behavioral: Negative.     Blood pressure 126/80, pulse 99, temperature 99 F (37.2 C), temperature source Oral, height 5' 11" (1.803 m), weight 111.131 kg (245 lb), SpO2 91.00%. Physical Exam  Constitutional: He is oriented to person, place, and time. He appears well-developed and well-nourished.  HENT:  Head: Normocephalic and atraumatic.  Mouth/Throat: Oropharynx is clear and moist.  Eyes: Conjunctivae and EOM are normal. Pupils are equal, round, and reactive to light.  Neck:  Normal range of motion. Neck supple.  Cardiovascular: Normal rate and regular rhythm.   Respiratory: Effort normal and breath sounds normal.  GI: Soft. Bowel sounds are normal.  Genitourinary:       Not pertinent to current symptomatology therefore not examined.  Musculoskeletal: He exhibits edema and tenderness.       Well developed well nourished white male in no acute distress. Alert and oriented. Height 5'11" weight 245 pounds, blood pressure 116/76 pulse is 87. Examination of his right knee reveals moderate varus deformity. 1 to 2+ crepitation 1+ synovitis range of motion 0-120 degrees knee is stable with diffuse pain and normal patella tracking. Exam of the left knee reveals well healed total knee incision without swelling or pain, range of motion 0-115 degrees knee is stable with normal patella tracking. Vascular exam: reveals 1+ pitting edema in the right leg which he says is chronic he has no swelling in the left leg. Pulses are 2+ and symmetric.  Patient has 3 plus pitting edema in his right leg today.  No swelling in his left leg  Neurological: He is alert and oriented to person, place, and time. He has normal reflexes.  Skin: Skin is warm and dry.  Psychiatric: He has a normal mood and affect. His behavior is normal. Judgment and thought content normal.     Assessment Patient Active Problem List  Diagnoses  . Hypertension  . GERD (gastroesophageal reflux disease)  . Hyperlipidemia  . Anxiety  . Sleep apnea  . Depression  . Right knee DJD    Plan  Doppler today to rule out a DVT.  If neg, then plan on total knee replacement on Monday.  I talked to him about this in detail and recommended with his significant persistent pain and findings on x-rays and exam that we proceed with right total knee replacement. Discussed risks benefits and possible complications in detail and he understand this completely. Skilled Nursing Post op  Jolene Guyett J 12/27/2011, 4:46 PM    

## 2012-01-03 ENCOUNTER — Encounter (HOSPITAL_COMMUNITY): Payer: Self-pay | Admitting: Orthopedic Surgery

## 2012-01-03 LAB — CBC
HCT: 39.5 % (ref 39.0–52.0)
Hemoglobin: 13.4 g/dL (ref 13.0–17.0)
MCH: 32.1 pg (ref 26.0–34.0)
MCHC: 33.9 g/dL (ref 30.0–36.0)
MCV: 94.7 fL (ref 78.0–100.0)
Platelets: 128 10*3/uL — ABNORMAL LOW (ref 150–400)
RBC: 4.17 MIL/uL — ABNORMAL LOW (ref 4.22–5.81)
RDW: 12.1 % (ref 11.5–15.5)
WBC: 8.4 10*3/uL (ref 4.0–10.5)

## 2012-01-03 LAB — GLUCOSE, CAPILLARY
Glucose-Capillary: 148 mg/dL — ABNORMAL HIGH (ref 70–99)
Glucose-Capillary: 133 mg/dL — ABNORMAL HIGH (ref 70–99)
Glucose-Capillary: 160 mg/dL — ABNORMAL HIGH (ref 70–99)
Glucose-Capillary: 136 mg/dL — ABNORMAL HIGH (ref 70–99)

## 2012-01-03 LAB — BASIC METABOLIC PANEL
BUN: 15 mg/dL (ref 6–23)
CO2: 27 meq/L (ref 19–32)
Calcium: 8.4 mg/dL (ref 8.4–10.5)
Chloride: 99 meq/L (ref 96–112)
Creatinine, Ser: 1.17 mg/dL (ref 0.50–1.35)
GFR calc Af Amer: 70 mL/min — ABNORMAL LOW (ref >90)
GFR calc non Af Amer: 60 mL/min — ABNORMAL LOW (ref >90)
Glucose, Bld: 144 mg/dL — ABNORMAL HIGH (ref 70–99)
Potassium: 4.5 meq/L (ref 3.5–5.1)
Sodium: 134 meq/L — ABNORMAL LOW (ref 135–145)

## 2012-01-03 MED ORDER — WARFARIN - PHARMACIST DOSING INPATIENT: Freq: Every day | Status: DC

## 2012-01-03 MED ORDER — COUMADIN BOOK
Freq: Once | Status: AC
  Administered 2012-01-03: 11:00:00
  Filled 2012-01-03: qty 1

## 2012-01-03 MED ORDER — TAMSULOSIN HCL 0.4 MG PO CAPS
0.4000 mg | ORAL_CAPSULE | Freq: Every day | ORAL | Status: DC
  Administered 2012-01-03 – 2012-01-05 (×2): 0.4 mg via ORAL
  Filled 2012-01-03 (×4): qty 1

## 2012-01-03 MED ORDER — SODIUM CHLORIDE 0.9 % IV BOLUS (SEPSIS)
500.0000 mL | Freq: Once | INTRAVENOUS | Status: AC
  Administered 2012-01-03: 500 mL via INTRAVENOUS

## 2012-01-03 MED ORDER — WARFARIN SODIUM 5 MG PO TABS
5.0000 mg | ORAL_TABLET | Freq: Once | ORAL | Status: AC
  Administered 2012-01-03: 5 mg via ORAL
  Filled 2012-01-03: qty 1

## 2012-01-03 MED ORDER — WARFARIN VIDEO
Freq: Once | Status: AC
  Administered 2012-01-03: 11:00:00

## 2012-01-03 NOTE — Progress Notes (Signed)
UR COMPLETED  

## 2012-01-03 NOTE — Progress Notes (Signed)
ANTICOAGULATION CONSULT NOTE - Initial Consult  Pharmacy Consult for Coumadin Indication: VTE prophylaxis  Allergies  Allergen Reactions  . Adhesive (Tape)     Patient Measurements: Height: 5\' 11"  (180.3 cm) (from 12/28/11 preadmission) Weight: 252 lb 13.9 oz (114.701 kg) (from 12/28/11 preadmission ) IBW/kg (Calculated) : 75.3    Vital Signs: Temp: 97.9 F (36.6 C) (03/19 0805) Temp src: Oral (03/19 0805) BP: 89/54 mmHg (03/19 0805) Pulse Rate: 84  (03/19 0805)  Labs:  On 12/28/11  PT= 12.9,  INR =0.95,  PTT= 26,  Hgb=16, Hct= 45.3,  Pltc = 144 (preadmission labs)  Basename 01/03/12 0530  HGB 13.4  HCT 39.5  PLT 128*  APTT --  LABPROT --  INR --  HEPARINUNFRC --  CREATININE 1.17  CKTOTAL --  CKMB --  TROPONINI --   Estimated Creatinine Clearance: 73.5 ml/min (by C-G formula based on Cr of 1.17).  Medical History: Past Medical History  Diagnosis Date  . Hyperlipidemia   . Hypertension   . GERD (gastroesophageal reflux disease)   . Anxiety   . Sleep apnea     Does NOT use his CPAP  . Depression   . Right knee DJD 12/27/2011    Medications:  Prescriptions prior to admission  Medication Sig Dispense Refill  . amLODipine (NORVASC) 10 MG tablet Take 10 mg by mouth daily.      Marland Kitchen aspirin EC 81 MG tablet Take 81 mg by mouth daily.      Marland Kitchen atorvastatin (LIPITOR) 20 MG tablet Take 20 mg by mouth daily.      . cetirizine (ZYRTEC) 10 MG tablet Take 10 mg by mouth daily.      . citalopram (CELEXA) 10 MG tablet Take 10 mg by mouth daily.      . diazepam (VALIUM) 5 MG tablet Take 5 mg by mouth every 6 (six) hours as needed.      Marland Kitchen esomeprazole (NEXIUM) 40 MG capsule Take 40 mg by mouth daily before breakfast.      . lisinopril (PRINIVIL,ZESTRIL) 20 MG tablet Take 20 mg by mouth daily.      . mirtazapine (REMERON) 15 MG tablet Take 15 mg by mouth at bedtime.      Marland Kitchen zolpidem (AMBIEN) 10 MG tablet Take 10 mg by mouth at bedtime as needed.       Scheduled:    .  amLODipine  10 mg Oral Daily  . atorvastatin  20 mg Oral q1800  . bisacodyl  10 mg Oral Q2000  .  ceFAZolin (ANCEF) IV  2 g Intravenous Q6H  . citalopram  10 mg Oral Daily  . docusate sodium  100 mg Oral BID  . lisinopril  20 mg Oral Daily  . loratadine  10 mg Oral Daily  . mirtazapine  15 mg Oral QHS  . pantoprazole  80 mg Oral Q1200  . sodium chloride  500 mL Intravenous Once  . sodium chloride  500 mL Intravenous Once  . Tamsulosin HCl  0.4 mg Oral QPC breakfast  . Warfarin - Pharmacist Dosing Inpatient   Does not apply q1800  . DISCONTD:  ceFAZolin (ANCEF) IV  2 g Intravenous 60 min Pre-Op  . DISCONTD: enoxaparin  30 mg Subcutaneous Q12H  . DISCONTD: povidone-iodine   Topical Once  . DISCONTD: simvastatin  40 mg Oral Daily    Assessment: POD #1 s/p Rt. TKA in this 71 y.o. Male due to right knee DJD.  Now to start on Coumadin for VTE  prophylaxis.  Lovenox VTE prophylaxis DC'd due to thrombocytopenia.  Lovenox 30mg  SQ given last PM @ 22:57. No bleeding reported.  Goal of Therapy:  INR = 1.5-2.0 per MD's order   Plan:  Coumadin 5mg  po today x 1. Will start this AM POD#1. PT/INR daily starting 01/04/12. Coumadin education book and video ordered.   Arman Filter, RPh 01/03/2012,8:57 AM

## 2012-01-03 NOTE — Progress Notes (Signed)
Physical Therapy Evaluation Note  Past Medical History  Diagnosis Date  . Hyperlipidemia   . Hypertension   . GERD (gastroesophageal reflux disease)   . Anxiety   . Sleep apnea     Does NOT use his CPAP  . Depression   . Right knee DJD 12/27/2011   Past Surgical History  Procedure Date  . Palate / uvula biopsy / excision   . Joint replacement 2006    left total knee in [place]  . Appendectomy   . Total knee arthroplasty 01/02/2012    Procedure: TOTAL KNEE ARTHROPLASTY;  Surgeon: Nilda Simmer, MD;  Location: [facility] OR;  Service: Orthopedics;  Laterality: Right;  DR Anne Ng FOR THIS CASE     01/03/12 0800  PT Visit Information  Last PT Received On 01/03/12  Precautions  Precautions Knee  Required Braces or Orthoses Yes  Knee Immobilizer On when out of bed or walking (R LE)  Restrictions  Weight Bearing Restrictions Yes  RLE Weight Bearing WBAT  Home Living  Lives With Alone (plans on going to rehab facility)  Type of Home House  Home Layout One level;Laundry or work area in [employer] Stairs to enter  Entrance Stairs-Rails None  Secretary/administrator of Steps 1  Bathroom Shower/Tub Walk-in Pension scheme manager Yes  How Accessible Accessible via Sport and exercise psychologist Straight cane  Prior Function  Level of Independence Independent with basic ADLs;Independent with gait;Independent with transfers  Able to Take Stairs? 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  Cognition  Arousal/Alertness Awake/alert  Overall Cognitive Status Appears within functional limits for tasks assessed  Orientation Level Oriented X4  Sensation  Light Touch Appears Intact  Bed Mobility  Bed Mobility (pt received sitting EOB upon arrival)  Transfers  Sit to Stand 1: +2 Total assist;Patient percentage (comment) (pt=60%)  Sit to Stand Details (indicate cue type  and reason) max directional verbal cues for sequencing, underarm assist   Ambulation/Gait  Ambulation/Gait Yes  Ambulation/Gait Assistance 1: +2 Total assist;Patient percentage (comment) (pt=60%)  Ambulation/Gait Assistance Details (indicate cue type and reason) attempted without KI and pt with + R knee buckling. Once KI applied to R LE paitent with improved ability to weight-bear but still had + R knee buckling.   Ambulation Distance (Feet) 10 Feet  Assistive device Rolling walker  Gait Pattern Step-to pattern;Decreased step length - right;Decreased stance time - right  Gait velocity decreased  Stairs No  Wheelchair Mobility  Wheelchair Mobility No  Posture/Postural Control  Posture/Postural Control No significant limitations  RUE Assessment  RUE Assessment WFL  LUE Assessment  LUE Assessment WFL  RLE Assessment  RLE Assessment Not tested (surgical LE)  LLE Assessment  LLE Assessment WFL  Cervical Assessment  Cervical Assessment [facility]  Thoracic Assessment  Thoracic Assessment WFL  Lumbar Assessment  Lumbar Assessment WFL  PT - End of Session  Equipment Utilized During Treatment Gait belt;Right knee immobilizer  Activity Tolerance Patient tolerated treatment well  Patient left in chair;with call bell in reach  Nurse Communication Mobility status for transfers;Mobility status for ambulation  CPM Right Knee  CPM Right Knee Off  General  Behavior During Session [facility] for tasks performed  Cognition [facility] for tasks performed  PT Assessment  Clinical Impression Statement Patient s/p R TKA who lives alone. Patient with decreased R LE strength and active R knee flexion. Patient to  benefit from skilled PT at rehab facility upon d/c to achieve I function for safe transition home.  PT Recommendation/Assessment Patient will need skilled PT in the acute care venue  PT Problem List Decreased strength;Decreased range of motion;Decreased activity tolerance;Decreased balance;Decreased mobility    Barriers to Discharge Decreased caregiver support  PT Therapy Diagnosis  Difficulty walking;Abnormality of gait;Generalized weakness;Acute pain  PT Plan  PT Frequency 7X/week  PT Treatment/Interventions Balance training;DME instruction;Gait training;Stair training;Functional mobility training;Therapeutic activities;Therapeutic exercise  PT Recommendation  Follow Up Recommendations Skilled nursing facility  Equipment Recommended Defer to next venue  Individuals Consulted  Consulted and Agree with Results and Recommendations Patient  Acute Rehab PT Goals  PT Goal Formulation With patient  Time For Goal Achievement 7 days  Pt will go Supine/Side to Sit with modified independence  PT Goal: Supine/Side to Sit - Progress Goal set today  Pt will go Sit to Stand with supervision  PT Goal: Sit to Stand - Progress Goal set today  Pt will Transfer Bed to Chair/Chair to Bed with modified independence (with RW)  PT Transfer Goal: Bed to Chair/Chair to Bed - Progress Goal set today  Pt will Ambulate 51 - 150 feet;with supervision;with rolling walker  PT Goal: Ambulate - Progress Goal set today  Pt will Perform Home Exercise Program Independently  PT Goal: Perform Home Exercise Program - Progress Goal set today    Pain: 5/10 R knee pain, increased with PT but pt did not rate  Lewis Shock, PT, DPT Pager #: [phone] Office #: [phone]

## 2012-01-03 NOTE — Evaluation (Signed)
Occupational Therapy Evaluation Patient Details Name: Jared Martinez MRN: [id number] DOB: 12-27-1940 Today's Date: 01/03/2012  Problem List:  Patient Active Problem List  Diagnoses  . Hypertension  . GERD (gastroesophageal reflux disease)  . Hyperlipidemia  . Anxiety  . Sleep apnea  . Depression  . Right knee DJD    Past Medical History:  Past Medical History  Diagnosis Date  . Hyperlipidemia   . Hypertension   . GERD (gastroesophageal reflux disease)   . Anxiety   . Sleep apnea     Does NOT use his CPAP  . Depression   . Right knee DJD 12/27/2011   Past Surgical History:  Past Surgical History  Procedure Date  . Palate / uvula biopsy / excision   . Joint replacement 2006    left total knee in [place]  . Appendectomy   . Total knee arthroplasty 01/02/2012    Procedure: TOTAL KNEE ARTHROPLASTY;  Surgeon: Nilda Simmer, MD;  Location: [facility] OR;  Service: Orthopedics;  Laterality: Right;  DR Anne Ng FOR THIS CASE    OT Assessment/Plan/Recommendation OT Assessment Clinical Impression Statement: Pt. presents s/p rt. TKA and with below problem list. Pt. will benefit from skilled OT to increase functional independence with ADLs and get pt. to supervision level at D/C. OT Recommendation/Assessment: Patient will need skilled OT in the acute care venue OT Problem List: Decreased strength;Decreased activity tolerance;Impaired balance (sitting and/or standing);Decreased knowledge of use of DME or AE;Decreased knowledge of precautions;Decreased safety awareness;Pain Barriers to Discharge: None OT Therapy Diagnosis : Acute pain OT Plan OT Frequency: Min 2X/week OT Treatment/Interventions: Self-care/ADL training;DME and/or AE instruction;Therapeutic activities;Patient/family education;Balance training OT Recommendation Follow Up Recommendations: Skilled nursing facility Equipment Recommended: Defer to next venue Individuals Consulted Consulted and Agree with Results  and Recommendations: Patient OT Goals Acute Rehab OT Goals OT Goal Formulation: With patient Time For Goal Achievement: 7 days ADL Goals Pt Will Perform Grooming: with set-up;with supervision;Standing at sink ADL Goal: Grooming - Progress: Goal set today Pt Will Perform Lower Body Bathing: with set-up;with supervision;Sit to stand from chair ADL Goal: Lower Body Bathing - Progress: Goal set today Pt Will Perform Lower Body Dressing: with set-up;with supervision;Sit to stand from chair;with adaptive equipment ADL Goal: Lower Body Dressing - Progress: Goal set today Pt Will Transfer to Toilet: with supervision;Ambulation;with DME;3-in-1 ADL Goal: Toilet Transfer - Progress: Goal set today  OT Evaluation Precautions/Restrictions  Precautions Precautions: Knee Required Braces or Orthoses: Yes Knee Immobilizer: On when out of bed or walking (R LE) Restrictions Weight Bearing Restrictions: Yes RLE Weight Bearing: Weight bearing as tolerated Prior Functioning Home Living Lives With: Alone (plans on going to rehab facility) Type of Home: House Home Layout: One level;Laundry or work area in [employer] Access: Stairs to enter Entrance Stairs-Rails: None Secretary/administrator of Steps: 1 Bathroom Shower/Tub: Health visitor: Pharmacist, community: Yes How Accessible: Accessible via walker Home Adaptive Equipment: Straight cane Prior Function Level of Independence: Independent with basic ADLs;Independent with gait;Independent with transfers Able to Take Stairs?: Yes Driving: Yes Vocation: Part time employment 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 ADL Eating/Feeding: Simulated;Independent Where Assessed - Eating/Feeding: Chair Grooming: Performed;Wash/dry face;Set up Where Assessed - Grooming: Sitting, chair Upper Body Bathing: Simulated;Chest;Right arm;Left arm;Abdomen;Set up Where Assessed - Upper Body Bathing: Sitting, bed Lower  Body Bathing: Simulated;Maximal assistance Where Assessed - Lower Body Bathing: Sit to stand from bed Upper Body Dressing: Performed;Minimal assistance Upper Body Dressing Details (indicate cue type and  reason): with donning gown Where Assessed - Upper Body Dressing: Sitting, bed Lower Body Dressing: Simulated;Maximal assistance Where Assessed - Lower Body Dressing: Sit to stand from chair Toilet Transfer: Simulated;+2 Total assistance;Comment for patient % (pt=60%) Toilet Transfer Details (indicate cue type and reason): mod verbal cues for hand placement and technique Toilet Transfer Method: Stand pivot Toilet Transfer Equipment: Other (comment) (recliner) Toileting - Clothing Manipulation: Simulated;Minimal assistance Where Assessed - Toileting Clothing Manipulation: Sit to stand from 3-in-1 or toilet Toileting - Hygiene: Simulated;Moderate assistance Where Assessed - Toileting Hygiene: Sit to stand from 3-in-1 or toilet Tub/Shower Transfer: Not assessed Tub/Shower Transfer Method: Not assessed Equipment Used: Rolling walker Ambulation Related to ADLs: Pt. mod assist for ~10' with RW and mod verbal cues for safety ADL Comments: Pt. educated on safety with functional transfers and techniques for completing LB ADLs.  Vision/Perception  Vision - History Baseline Vision: Wears glasses all the time Patient Visual Report: No change from baseline Vision - Assessment Eye Alignment: Within Functional Limits Vision Assessment: Vision not tested    Extremity Assessment RUE Assessment RUE Assessment: Within Functional Limits LUE Assessment LUE Assessment: Within Functional Limits Mobility  Bed Mobility Bed Mobility: No (Pt. presented sitting EOB) Transfers Transfers: Yes Sit to Stand: 1: +2 Total assist;Patient percentage (comment);From bed;With upper extremity assist (pt=60%) Sit to Stand Details (indicate cue type and reason): Mod verbal cues for hand placement and transfer  technique    End of Session OT - End of Session Equipment Utilized During Treatment: Gait belt;Right lower extremity prosthesis Activity Tolerance: Patient tolerated treatment well Patient left: in chair;with call bell in reach Nurse Communication: Mobility status for transfers General Behavior During Session: [facility] for tasks performed Cognition: [facility] for tasks performed  Co-evaluation with Flora Lipps, OTR/L Pager [phone] 01/03/2012, 1:06 PM

## 2012-01-03 NOTE — Progress Notes (Signed)
Physical Therapy Treatment Note   01/03/12 1630  PT Visit Information  Last PT Received On 01/03/12  Precautions  Precautions Knee  Knee Immobilizer On when out of bed or walking  Restrictions  RLE Weight Bearing WBAT  Bed Mobility  Bed Mobility Yes  Supine to Sit 4: Min assist;With rails;HOB flat (A for R LE and v/c for sequencing)  Transfers  Sit to Stand 2: Max assist;From bed (v/cs for technique/hand placement, pt unsteady)  Sit to Stand Details (indicate cue type and reason) pt "loopy" from pain medicine requiring freq v/cs for safety and technique  Stand Pivot Transfers 2: Max assist (pt = 70%, A for walker management v/c for sequencing)  Stand Pivot Transfer Details (indicate cue type and reason) + LOB requiring maxA to lower to chair safely  Ambulation/Gait  Ambulation/Gait Yes  Total Joint Exercises  Ankle Circles/Pumps AROM;Both;10 reps;Supine  Quad Sets AROM;Right;10 reps;Supine (v/cs to stay awake and on task)  Heel Slides AAROM;Right;10 reps;Supine  PT - End of Session  Equipment Utilized During Treatment Gait belt;Right knee immobilizer  Patient left in chair;with call bell in reach;with family/visitor present  Nurse Communication Mobility status for transfers;Mobility status for ambulation  General  Behavior During Session [facility] for tasks performed  Cognition [facility] for tasks performed  PT - Assessment/Plan  Comments on Treatment Session Patient remains to have delayed processing and decreased safety with RW most likely due to increased pain meds per RN report. Patient con't to have difficulty achieve R quad set.  PT Plan Discharge plan remains appropriate  PT Frequency 7X/week  Follow Up Recommendations Skilled nursing facility  Acute Rehab PT Goals  PT Goal: Supine/Side to Sit - Progress Progressing toward goal  PT Goal: Sit to Stand - Progress Progressing toward goal  PT Transfer Goal: Bed to Chair/Chair to Bed - Progress Progressing toward goal    Pain: 4/10  R knee pain  Lewis Shock, PT, DPT Pager #: [phone] Office #: [phone]

## 2012-01-03 NOTE — Progress Notes (Signed)
Clinical Social Work-CSW completed full assessment-please see shadow chart-pt would like [facility]-CSW initiated bed search and will facilitate d/c when bed available-Timmie Dugue-MSW, [phone]

## 2012-01-03 NOTE — Progress Notes (Signed)
Patient ID: Jared Martinez, male   DOB: 02-12-1941, 71 y.o.   MRN: [id number] PATIENT ID: Jared Martinez  MRN: [id number]  DOB/AGE:  [birth year] / 71 y.o.  1 Day Post-Op Procedure(s) (LRB): TOTAL KNEE ARTHROPLASTY (Right)    PROGRESS NOTE Subjective: Patient is alert, oriented, no Nausea, yes once Vomiting, yes} passing gas, no Bowel Movement. Taking PO eating well. Denies SOB, Chest or Calf Pain. Using Incentive Spirometer, PAS in place. Ambulate not yet, CPM 0-90 Patient reports pain as 4 on 0-10 scale  .    Objective: Vital signs in last 24 hours: Filed Vitals:   01/02/12 1300 01/02/12 2323 01/03/12 0427 01/03/12 0544  BP: 137/74 146/71 138/72 120/64  Pulse: 88 98 75 105  Temp: 97.2 F (36.2 C) 98.5 F (36.9 C) 98 F (36.7 C) 99.7 F (37.6 C)  TempSrc:      Resp: 16 18 18 20   SpO2: 100% 96% 98% 93%      Intake/Output from previous day: I/O last 3 completed shifts: In: 2640 [P.O.:240; I.V.:2400] Out: 3700 [Urine:3100; Drains:600]   Intake/Output this shift:     LABORATORY DATA:  Basename 01/03/12 0618 01/03/12 0530 01/02/12 2149 01/02/12 1641  WBC -- 8.4 -- --  HGB -- 13.4 -- --  HCT -- 39.5 -- --  PLT -- 128* -- --  NA -- 134* -- --  K -- 4.5 -- --  CL -- 99 -- --  CO2 -- 27 -- --  BUN -- 15 -- --  CREATININE -- 1.17 -- --  GLUCOSE -- 144* -- --  GLUCAP 148* -- 120* 148*  INR -- -- -- --  CALCIUM -- 8.4 -- --    Examination: ABD soft Neurovascular intact Sensation intact distally Intact pulses distally Dorsiflexion/Plantar flexion intact Incision: dressing C/D/I}  Assessment:   1 Day Post-Op Procedure(s) (LRB): TOTAL KNEE ARTHROPLASTY (Right) ADDITIONAL DIAGNOSIS:  Acute Blood Loss Anemia, Hypertension, Sleep Apnea and GERD, history of urinary retention after last total knee, throbocytopenia  Plan: PT/OT WBAT, CPM 5/hrs day until ROM 0-90 degrees, then D/C CPM DVT Prophylaxis: Coumadin target INR 1.5-2.0.  STOP LOVENOX  thrombocytopenia DISCHARGE  PLAN: Skilled Nursing Facility/Rehab DISCHARGE NEEDS: fluid bolus times two, start flomax, dressing change, saline lock after fluid bolus, stop lovenox, start coumadin because of thrombocytopenia     Domonic Kimball J 01/03/2012, 7:41 AM

## 2012-01-04 LAB — CBC
HCT: 35.2 % — ABNORMAL LOW (ref 39.0–52.0)
Hemoglobin: 12 g/dL — ABNORMAL LOW (ref 13.0–17.0)
MCH: 32.3 pg (ref 26.0–34.0)
MCHC: 34.1 g/dL (ref 30.0–36.0)
MCV: 94.9 fL (ref 78.0–100.0)
Platelets: 98 10*3/uL — ABNORMAL LOW (ref 150–400)
RBC: 3.71 MIL/uL — ABNORMAL LOW (ref 4.22–5.81)
RDW: 12.2 % (ref 11.5–15.5)
WBC: 8.5 10*3/uL (ref 4.0–10.5)

## 2012-01-04 LAB — BASIC METABOLIC PANEL
BUN: 19 mg/dL (ref 6–23)
CO2: 28 meq/L (ref 19–32)
Calcium: 8.5 mg/dL (ref 8.4–10.5)
Chloride: 98 meq/L (ref 96–112)
Creatinine, Ser: 1.28 mg/dL (ref 0.50–1.35)
GFR calc Af Amer: 63 mL/min — ABNORMAL LOW (ref >90)
GFR calc non Af Amer: 54 mL/min — ABNORMAL LOW (ref >90)
Glucose, Bld: 136 mg/dL — ABNORMAL HIGH (ref 70–99)
Potassium: 4.4 meq/L (ref 3.5–5.1)
Sodium: 134 meq/L — ABNORMAL LOW (ref 135–145)

## 2012-01-04 LAB — GLUCOSE, CAPILLARY
Glucose-Capillary: 129 mg/dL — ABNORMAL HIGH (ref 70–99)
Glucose-Capillary: 154 mg/dL — ABNORMAL HIGH (ref 70–99)
Glucose-Capillary: 136 mg/dL — ABNORMAL HIGH (ref 70–99)
Glucose-Capillary: 134 mg/dL — ABNORMAL HIGH (ref 70–99)

## 2012-01-04 LAB — PROTIME-INR
INR: 1.14 (ref 0.00–1.49)
Prothrombin Time: 14.8 s (ref 11.6–15.2)

## 2012-01-04 MED ORDER — METOCLOPRAMIDE HCL 5 MG/ML IJ SOLN
10.0000 mg | Freq: Four times a day (QID) | INTRAMUSCULAR | Status: DC
  Administered 2012-01-05 (×2): 10 mg via INTRAVENOUS
  Filled 2012-01-04 (×6): qty 2

## 2012-01-04 MED ORDER — BISACODYL 10 MG RE SUPP
10.0000 mg | Freq: Two times a day (BID) | RECTAL | Status: DC
  Administered 2012-01-04: 10 mg via RECTAL
  Filled 2012-01-04: qty 1

## 2012-01-04 MED ORDER — WARFARIN SODIUM 5 MG PO TABS
5.0000 mg | ORAL_TABLET | Freq: Once | ORAL | Status: AC
  Administered 2012-01-04: 5 mg via ORAL
  Filled 2012-01-04: qty 1

## 2012-01-04 NOTE — Progress Notes (Signed)
Physical Therapy Treatment Note   01/04/12 0841  PT Visit Information  Last PT Received On 01/04/12  Precautions  Precautions Knee  Knee Immobilizer On when out of bed or walking  Restrictions  RLE Weight Bearing WBAT  Bed Mobility  Supine to Sit 4: Min assist  Supine to Sit Details (indicate cue type and reason) minA for R LE management, min use of bed rail  Transfers  Sit to Stand 3: Mod assist;From bed;With upper extremity assist  Sit to Stand Details (indicate cue type and reason) pt requires assist due to inability to push up or rely on L LE  Ambulation/Gait  Ambulation/Gait Yes  Ambulation/Gait Assistance 4: Min assist  Ambulation/Gait Assistance Details (indicate cue type and reason) R KI used, limited by feeling of "whooziness"  Ambulation Distance (Feet) 15 Feet  Assistive device Rolling walker  Gait Pattern Step-to pattern;Decreased step length - right;Decreased stance time - right;Antalgic  Gait velocity decreased  Stairs No  Total Joint Exercises  Ankle Circles/Pumps AROM;Both;10 reps;Supine  Quad Sets AROM;Right;10 reps;Supine  Heel Slides Right;10 reps;AAROM;Supine (also active in sitting, achieved 60 degrees)  Short Arc Quad AROM;Right;10 reps;Seated  PT - End of Session  Equipment Utilized During Treatment Gait belt;Right knee immobilizer  Patient left in chair;with call bell in reach  Nurse Communication Mobility status for transfers;Mobility status for ambulation  General  Behavior During Session [facility] for tasks performed  Cognition [facility] for tasks performed  PT - Assessment/Plan  Comments on Treatment Session Patient with improved ambulation technique, no R knee buckling but con't to be limited by +whooziness with mobility. patient with improved active R knee ROM and improved abilty to complete R quad set.  PT Plan Discharge plan remains appropriate  PT Frequency 7X/week  Follow Up Recommendations Skilled nursing facility  Equipment Recommended Defer to next  venue  Acute Rehab PT Goals  PT Goal: Supine/Side to Sit - Progress Progressing toward goal  PT Goal: Sit to Stand - Progress Progressing toward goal  PT Transfer Goal: Bed to Chair/Chair to Bed - Progress Progressing toward goal  PT Goal: Ambulate - Progress Progressing toward goal  PT Goal: Perform Home Exercise Program - Progress Progressing toward goal    Pain: 5/10 R knee  Lewis Shock, PT, DPT Pager #: [phone] Office #: [phone]

## 2012-01-04 NOTE — Progress Notes (Signed)
Patient ID: Jared Martinez, male   DOB: 10/14/1941, 71 y.o.   MRN: [id number] PATIENT ID: Jared Martinez  MRN: [id number]  DOB/AGE:  1941-09-13 / 71 y.o.  2 Days Post-Op Procedure(s) (LRB): TOTAL KNEE ARTHROPLASTY (Right)    PROGRESS NOTE Subjective: Patient is alert, oriented, no Nausea, no Vomiting, yes} passing gas,no Bowel Movement. Taking PO yes Denies SOB, Chest or Calf Pain. Using Incentive Spirometer, PAS in place. Ambulate in the hall, CPM 0-70 Patient reports pain as 5/10.    Objective: Vital signs in last 24 hours: Filed Vitals:   01/03/12 1320 01/03/12 2122 01/04/12 0622 01/04/12 0800  BP: 112/54 138/68 128/61 103/56  Pulse: 106 96 102 115  Temp: 98.1 F (36.7 C) 99.3 F (37.4 C) 100 F (37.8 C) 100.9 F (38.3 C)  TempSrc: Oral Oral Oral Oral  Resp: 14 22 20 20   Height:      Weight:      SpO2: 78% 94% 92% 94%      Intake/Output from previous day: I/O last 3 completed shifts: In: 1020 [P.O.:120; I.V.:900] Out: 2800 [Urine:2200; Drains:600]   Intake/Output this shift: Total I/O In: -  Out: 200 [Urine:200]   LABORATORY DATA:  Basename 01/04/12 0715 01/04/12 0645 01/03/12 2124 01/03/12 1605 01/03/12 0530  WBC -- 8.5 -- -- 8.4  HGB -- 12.0* -- -- 13.4  HCT -- 35.2* -- -- 39.5  PLT -- 98* -- -- 128*  NA -- 134* -- -- 134*  K -- 4.4 -- -- 4.5  CL -- 98 -- -- 99  CO2 -- 28 -- -- 27  BUN -- 19 -- -- 15  CREATININE -- 1.28 -- -- 1.17  GLUCOSE -- 136* -- -- 144*  GLUCAP 129* -- 136* 160* --  INR -- -- -- -- --  CALCIUM -- 8.5 -- -- --    Examination: Pt is alert and oriented.  Lungs clear.  Positive bowel sounds.  Abdomen distended.  Wound well approximated.    Assessment:   2 Days Post-Op Procedure(s) (LRB): TOTAL KNEE ARTHROPLASTY (Right) ADDITIONAL DIAGNOSIS:  Acute blood loss anemia  Plan: PT/OT WBAT, CPM 5/hrs day until ROM 0-90 degrees, then D/C CPM DVT Prophylaxis: Coumadin bridge target INR 1.5-2.0 DISCHARGE PLAN: to [facility]  tomorrow DISCHARGE NEEDS: needs dulcolax suppository.  Needs reglan     Pascal Lux 01/04/2012, 10:56 AM

## 2012-01-04 NOTE — Progress Notes (Signed)
ANTICOAGULATION CONSULT NOTE - Initial Consult  Pharmacy Consult for Coumadin Indication: VTE prophylaxis  Allergies  Allergen Reactions  . Adhesive (Tape)     Patient Measurements: Height: 5\' 11"  (180.3 cm) (from 12/28/11 preadmission) Weight: 252 lb 13.9 oz (114.701 kg) (from 12/28/11 preadmission ) IBW/kg (Calculated) : 75.3    Vital Signs: Temp: 99.1 F (37.3 C) (03/20 1148) Temp src: Oral (03/20 1148) BP: 104/53 mmHg (03/20 1206) Pulse Rate: 106  (03/20 1148)  Labs:   Basename 01/04/12 1100 01/04/12 0645 01/03/12 0530  HGB -- 12.0* 13.4  HCT -- 35.2* 39.5  PLT -- 98* 128*  APTT -- -- --  LABPROT 14.8 -- --  INR 1.14 -- --  HEPARINUNFRC -- -- --  CREATININE -- 1.28 1.17  CKTOTAL -- -- --  CKMB -- -- --  TROPONINI -- -- --   Estimated Creatinine Clearance: 67.2 ml/min (by C-G formula based on Cr of 1.28).  Medical History: Past Medical History  Diagnosis Date  . Hyperlipidemia   . Hypertension   . GERD (gastroesophageal reflux disease)   . Anxiety   . Sleep apnea     Does NOT use his CPAP  . Depression   . Right knee DJD 12/27/2011    Medications:  Prescriptions prior to admission  Medication Sig Dispense Refill  . amLODipine (NORVASC) 10 MG tablet Take 10 mg by mouth daily.      Marland Kitchen aspirin EC 81 MG tablet Take 81 mg by mouth daily.      Marland Kitchen atorvastatin (LIPITOR) 20 MG tablet Take 20 mg by mouth daily.      . cetirizine (ZYRTEC) 10 MG tablet Take 10 mg by mouth daily.      . citalopram (CELEXA) 10 MG tablet Take 10 mg by mouth daily.      . diazepam (VALIUM) 5 MG tablet Take 5 mg by mouth every 6 (six) hours as needed.      Marland Kitchen esomeprazole (NEXIUM) 40 MG capsule Take 40 mg by mouth daily before breakfast.      . lisinopril (PRINIVIL,ZESTRIL) 20 MG tablet Take 20 mg by mouth daily.      . mirtazapine (REMERON) 15 MG tablet Take 15 mg by mouth at bedtime.      Marland Kitchen zolpidem (AMBIEN) 10 MG tablet Take 10 mg by mouth at bedtime as needed.       Scheduled:       . amLODipine  10 mg Oral Daily  . atorvastatin  20 mg Oral q1800  . bisacodyl  10 mg Oral Q2000  . bisacodyl  10 mg Rectal BID AC  . citalopram  10 mg Oral Daily  . docusate sodium  100 mg Oral BID  . lisinopril  20 mg Oral Daily  . loratadine  10 mg Oral Daily  . metoCLOPramide (REGLAN) injection  10 mg Intravenous Q6H  . mirtazapine  15 mg Oral QHS  . pantoprazole  80 mg Oral Q1200  . sodium chloride  500 mL Intravenous Once  . Tamsulosin HCl  0.4 mg Oral QPC breakfast  . Warfarin - Pharmacist Dosing Inpatient   Does not apply q1800    Assessment: POD #2 s/p Rt. TKA in this 71 y.o. Male due to right knee DJD.  Now to start on Coumadin for VTE prophylaxis.  Lovenox VTE prophylaxis DC'd due to thrombocytopenia.    Goal of Therapy:  INR = 1.5-2.0 per MD's order   Plan:  Coumadin 5mg  po today. Cont PT/INR daily.  Woodfin Ganja, PharmD 01/04/2012,12:37 PM

## 2012-01-05 DIAGNOSIS — D62 Acute posthemorrhagic anemia: Secondary | ICD-10-CM

## 2012-01-05 DIAGNOSIS — E871 Hypo-osmolality and hyponatremia: Secondary | ICD-10-CM

## 2012-01-05 LAB — BASIC METABOLIC PANEL
BUN: 18 mg/dL (ref 6–23)
CO2: 26 meq/L (ref 19–32)
Calcium: 8.8 mg/dL (ref 8.4–10.5)
Chloride: 98 meq/L (ref 96–112)
Creatinine, Ser: 1.16 mg/dL (ref 0.50–1.35)
GFR calc Af Amer: 71 mL/min — ABNORMAL LOW (ref >90)
GFR calc non Af Amer: 61 mL/min — ABNORMAL LOW (ref >90)
Glucose, Bld: 119 mg/dL — ABNORMAL HIGH (ref 70–99)
Potassium: 4.3 meq/L (ref 3.5–5.1)
Sodium: 134 meq/L — ABNORMAL LOW (ref 135–145)

## 2012-01-05 LAB — PROTIME-INR
INR: 1.26 (ref 0.00–1.49)
INR: 1.23 (ref 0.00–1.49)
Prothrombin Time: 16.1 s — ABNORMAL HIGH (ref 11.6–15.2)
Prothrombin Time: 15.8 s — ABNORMAL HIGH (ref 11.6–15.2)

## 2012-01-05 LAB — GLUCOSE, CAPILLARY
Glucose-Capillary: 119 mg/dL — ABNORMAL HIGH (ref 70–99)
Glucose-Capillary: 150 mg/dL — ABNORMAL HIGH (ref 70–99)
Glucose-Capillary: 116 mg/dL — ABNORMAL HIGH (ref 70–99)

## 2012-01-05 LAB — CBC
HCT: 33.6 % — ABNORMAL LOW (ref 39.0–52.0)
Hemoglobin: 11.7 g/dL — ABNORMAL LOW (ref 13.0–17.0)
MCH: 32.6 pg (ref 26.0–34.0)
MCHC: 34.8 g/dL (ref 30.0–36.0)
MCV: 93.6 fL (ref 78.0–100.0)
Platelets: 98 10*3/uL — ABNORMAL LOW (ref 150–400)
RBC: 3.59 MIL/uL — ABNORMAL LOW (ref 4.22–5.81)
RDW: 12 % (ref 11.5–15.5)
WBC: 8.4 10*3/uL (ref 4.0–10.5)

## 2012-01-05 MED ORDER — DIAZEPAM 5 MG PO TABS: 5.0000 mg | ORAL_TABLET | Freq: Four times a day (QID) | ORAL | Status: AC | PRN

## 2012-01-05 MED ORDER — DSS 100 MG PO CAPS: 100.0000 mg | ORAL_CAPSULE | Freq: Two times a day (BID) | ORAL | Status: AC

## 2012-01-05 MED ORDER — OXYCODONE-ACETAMINOPHEN 5-325 MG PO TABS: 1.0000 | ORAL_TABLET | Freq: Four times a day (QID) | ORAL | Status: AC | PRN

## 2012-01-05 MED ORDER — WARFARIN SODIUM 5 MG PO TABS
5.0000 mg | ORAL_TABLET | Freq: Once | ORAL | Status: DC
  Filled 2012-01-05: qty 1

## 2012-01-05 MED ORDER — WARFARIN SODIUM 5 MG PO TABS: 5.0000 mg | ORAL_TABLET | Freq: Every day | ORAL | Status: AC

## 2012-01-05 MED ORDER — ONDANSETRON HCL 4 MG PO TABS: 4.0000 mg | ORAL_TABLET | Freq: Four times a day (QID) | ORAL | Status: AC | PRN

## 2012-01-05 NOTE — Progress Notes (Signed)
CARE MANAGEMENT NOTE 01/05/2012      Action/Plan:   Discharge planning. Patient is for SNF for sshortterm rehab. Going to [facility] in [place].   Anticipated DC Date:  01/05/2012   Anticipated DC Plan:  SKILLED NURSING FACILITY  In-house referral  Clinical Social Worker      DC Planning Services  CM consult      [facility] Choice  NA   Choice offered to / List presented to:  NA   DME arranged  NA      DME agency  NA     HH arranged  NA      Status of service:  Completed, signed off  Discharge Disposition:  SKILLED NURSING FACILITY

## 2012-01-05 NOTE — Progress Notes (Signed)
Clinical Social Work-CSW confirmed bed availability with [facility] in [place]-will f/u with d/c when pt cleared-Voncile Schwarz-MSW, [phone]

## 2012-01-05 NOTE — Progress Notes (Signed)
ANTICOAGULATION CONSULT NOTE - Follow-Up Consult  Pharmacy Consult for Coumadin Indication: VTE prophylaxis  Allergies  Allergen Reactions  . Adhesive (Tape)     Patient Measurements: Height: 5\' 11"  (180.3 cm) (from 12/28/11 preadmission) Weight: 252 lb 13.9 oz (114.701 kg) (from 12/28/11 preadmission ) IBW/kg (Calculated) : 75.3    Vital Signs: Temp: 99.4 F (37.4 C) (03/21 0541) BP: 121/55 mmHg (03/21 0541) Pulse Rate: 115  (03/21 0541)  Labs:   Basename 01/05/12 0859 01/05/12 0535 01/04/12 1100 01/04/12 0645 01/03/12 0530  HGB -- 11.7* -- 12.0* --  HCT -- 33.6* -- 35.2* 39.5  PLT -- 98* -- 98* 128*  APTT -- -- -- -- --  LABPROT 15.8* 16.1* 14.8 -- --  INR 1.23 1.26 1.14 -- --  HEPARINUNFRC -- -- -- -- --  CREATININE -- 1.16 -- 1.28 1.17  CKTOTAL -- -- -- -- --  CKMB -- -- -- -- --  TROPONINI -- -- -- -- --   Estimated Creatinine Clearance: 74.2 ml/min (by C-G formula based on Cr of 1.16).   Assessment: On Coumadin for VTE prophylaxis s/p R TKA.  Lovenox VTE prophylaxis DC'd due to thrombocytopenia. INR remains subtherapeutic - not much movement on 5mg  x 2 days.    Goal of Therapy:  INR = 1.5-2.0 per MD's order   Plan:  Noted plan for pt to go to SNF on Coumadin 5mg  daily with INR monitoring.  Christoper Fabian, PharmD, BCPS Clinical pharmacist, pager [phone] 01/05/2012,1:25 PM

## 2012-01-05 NOTE — Discharge Summary (Signed)
Physician Discharge Summary  Patient ID: Jared Martinez MRN: [id number] DOB/AGE: 03-04-1941 71 y.o.  Admit date: 01/02/2012 Discharge date: 01/05/2012  Admission Diagnoses: Past Medical History  Diagnosis Date  . Hyperlipidemia   . Hypertension   . GERD (gastroesophageal reflux disease)   . Anxiety   . Sleep apnea     Does NOT use his CPAP  . Depression   . Right knee DJD 12/27/2011   Discharge Diagnoses:  Principal Problem:  *Right knee DJD Active Problems:  Hypertension  GERD (gastroesophageal reflux disease)  Anxiety  Sleep apnea  Postoperative anemia due to acute blood loss  Hyponatremia   Discharged Condition: good  Hospital Course: This patient had a total knee replacement on 01/02/2012 by Dr. Thurston Hole. He had a femoral nerve block by anesthesia. He was placed in a CPM postoperatively 0-90 and tolerated this well. Postop day 1,  patient was sitting on the side of the bed alert and oriented eating his breakfast.  His catheter was removed. He was started on Flomax due to his history of urinary retention postoperatively after his last total knee replacement. This total knee replacement he has had any difficulty with postoperative urinary retention and he received Flomax 0.4 mg in the hospital but this has since been stopped as he is being transferred to skilled nursing. He has had some slight difficulty with hyponatremia as sodium that's remained steady at 134 he's completely asymptomatic with this. His hemoglobin is 11.1 today his BUN and creatinine are within normal limits. Surgical wound is well approximated and healing. He is able to walk from the bed to the door and back. He does have difficulty with nausea with ambulation and difficulty with lightheadedness with ambulation. He is needing verbal cues to remember to move the walker and to safely get in and out of bed. He is able to get in and out of bed with no assistance. He does move very slowly and does need verbal cues. He's  tolerating a CPM 0-90. His surgical wound is well approximated he has a slight bit or redness. Scant drainage..  Preoperatively he had 3+ pitting edema in his right leg this is completely been completely worked up with a Doppler to rule out a DVT and a Baker cyst as well as a CT of his abdomen and pelvis to rule out a mass. He is edema in his right leg has improved but his right leg is bigger than his left leg.  He also had thrombocytopenia preoperatively. Postoperatively Lovenox was discontinued due to the thrombocytopenia he will need to be on Coumadin for 30 days postop discontinue date for Coumadin is April 22,013.   He is being discharged to skilled nursing weightbearing as tolerated on a regular diet.  He'll need physical therapy daily when he is not in physical therapy and not walking and not in his CPM, he will need to have a yellow foam block placed under his right heel to work on extension. He'll followup with Dr. Thurston Hole as scheduled on April 1. Please call our office with increased pain increased redness increased drainage or temp greater than 101 our office number is [phone]  Consults: None  Significant Diagnostic Studies:  Results for orders placed during the hospital encounter of 01/02/12 (from the past 72 hour(s))  GLUCOSE, CAPILLARY     Status: Abnormal   Collection Time   01/02/12  4:41 PM      Component Value Range Comment   Glucose-Capillary 148 (*) 70 - 99 (mg/dL)  Comment 1 Notify RN     GLUCOSE, CAPILLARY     Status: Abnormal   Collection Time   01/02/12  9:49 PM      Component Value Range Comment   Glucose-Capillary 120 (*) 70 - 99 (mg/dL)    Comment 1 Documented in Chart      Comment 2 Notify RN     CBC     Status: Abnormal   Collection Time   01/03/12  5:30 AM      Component Value Range Comment   WBC 8.4  4.0 - 10.5 (K/uL)    RBC 4.17 (*) 4.22 - 5.81 (MIL/uL)    Hemoglobin 13.4  13.0 - 17.0 (g/dL)    HCT 56.2  13.0 - 86.5 (%)    MCV 94.7  78.0 - 100.0 (fL)      MCH 32.1  26.0 - 34.0 (pg)    MCHC 33.9  30.0 - 36.0 (g/dL)    RDW 78.4  69.6 - 29.5 (%)    Platelets 128 (*) 150 - 400 (K/uL)   BASIC METABOLIC PANEL     Status: Abnormal   Collection Time   01/03/12  5:30 AM      Component Value Range Comment   Sodium 134 (*) 135 - 145 (mEq/L)    Potassium 4.5  3.5 - 5.1 (mEq/L)    Chloride 99  96 - 112 (mEq/L)    CO2 27  19 - 32 (mEq/L)    Glucose, Bld 144 (*) 70 - 99 (mg/dL)    BUN 15  6 - 23 (mg/dL)    Creatinine, Ser 2.84  0.50 - 1.35 (mg/dL)    Calcium 8.4  8.4 - 10.5 (mg/dL)    GFR calc non Af Amer 60 (*) >90 (mL/min)    GFR calc Af Amer 70 (*) >90 (mL/min)   GLUCOSE, CAPILLARY     Status: Abnormal   Collection Time   01/03/12  6:18 AM      Component Value Range Comment   Glucose-Capillary 148 (*) 70 - 99 (mg/dL)    Comment 1 Documented in Chart      Comment 2 Notify RN     GLUCOSE, CAPILLARY     Status: Abnormal   Collection Time   01/03/12 11:27 AM      Component Value Range Comment   Glucose-Capillary 133 (*) 70 - 99 (mg/dL)    Comment 1 Notify RN     GLUCOSE, CAPILLARY     Status: Abnormal   Collection Time   01/03/12  4:05 PM      Component Value Range Comment   Glucose-Capillary 160 (*) 70 - 99 (mg/dL)   GLUCOSE, CAPILLARY     Status: Abnormal   Collection Time   01/03/12  9:24 PM      Component Value Range Comment   Glucose-Capillary 136 (*) 70 - 99 (mg/dL)    Comment 1 Notify RN     CBC     Status: Abnormal   Collection Time   01/04/12  6:45 AM      Component Value Range Comment   WBC 8.5  4.0 - 10.5 (K/uL)    RBC 3.71 (*) 4.22 - 5.81 (MIL/uL)    Hemoglobin 12.0 (*) 13.0 - 17.0 (g/dL)    HCT 13.2 (*) 44.0 - 52.0 (%)    MCV 94.9  78.0 - 100.0 (fL)    MCH 32.3  26.0 - 34.0 (pg)    MCHC 34.1  30.0 - 36.0 (  g/dL)    RDW 16.1  09.6 - 04.5 (%)    Platelets 98 (*) 150 - 400 (K/uL)   BASIC METABOLIC PANEL     Status: Abnormal   Collection Time   01/04/12  6:45 AM      Component Value Range Comment   Sodium 134 (*) 135 -  145 (mEq/L)    Potassium 4.4  3.5 - 5.1 (mEq/L)    Chloride 98  96 - 112 (mEq/L)    CO2 28  19 - 32 (mEq/L)    Glucose, Bld 136 (*) 70 - 99 (mg/dL)    BUN 19  6 - 23 (mg/dL)    Creatinine, Ser 4.09  0.50 - 1.35 (mg/dL)    Calcium 8.5  8.4 - 10.5 (mg/dL)    GFR calc non Af Amer 54 (*) >90 (mL/min)    GFR calc Af Amer 63 (*) >90 (mL/min)   GLUCOSE, CAPILLARY     Status: Abnormal   Collection Time   01/04/12  7:15 AM      Component Value Range Comment   Glucose-Capillary 129 (*) 70 - 99 (mg/dL)   GLUCOSE, CAPILLARY     Status: Abnormal   Collection Time   01/04/12 10:58 AM      Component Value Range Comment   Glucose-Capillary 154 (*) 70 - 99 (mg/dL)    Comment 1 Notify RN     PROTIME-INR     Status: Normal   Collection Time   01/04/12 11:00 AM      Component Value Range Comment   Prothrombin Time 14.8  11.6 - 15.2 (seconds)    INR 1.14  0.00 - 1.49    GLUCOSE, CAPILLARY     Status: Abnormal   Collection Time   01/04/12  4:09 PM      Component Value Range Comment   Glucose-Capillary 136 (*) 70 - 99 (mg/dL)    Comment 1 Notify RN     GLUCOSE, CAPILLARY     Status: Abnormal   Collection Time   01/04/12 10:05 PM      Component Value Range Comment   Glucose-Capillary 134 (*) 70 - 99 (mg/dL)    Comment 1 Documented in Chart      Comment 2 Notify RN     CBC     Status: Abnormal   Collection Time   01/05/12  5:35 AM      Component Value Range Comment   WBC 8.4  4.0 - 10.5 (K/uL)    RBC 3.59 (*) 4.22 - 5.81 (MIL/uL)    Hemoglobin 11.7 (*) 13.0 - 17.0 (g/dL)    HCT 81.1 (*) 91.4 - 52.0 (%)    MCV 93.6  78.0 - 100.0 (fL)    MCH 32.6  26.0 - 34.0 (pg)    MCHC 34.8  30.0 - 36.0 (g/dL)    RDW 78.2  95.6 - 21.3 (%)    Platelets 98 (*) 150 - 400 (K/uL) CONSISTENT WITH PREVIOUS RESULT  BASIC METABOLIC PANEL     Status: Abnormal   Collection Time   01/05/12  5:35 AM      Component Value Range Comment   Sodium 134 (*) 135 - 145 (mEq/L)    Potassium 4.3  3.5 - 5.1 (mEq/L)    Chloride 98   96 - 112 (mEq/L)    CO2 26  19 - 32 (mEq/L)    Glucose, Bld 119 (*) 70 - 99 (mg/dL)    BUN 18  6 - 23 (  mg/dL)    Creatinine, Ser 4.54  0.50 - 1.35 (mg/dL)    Calcium 8.8  8.4 - 10.5 (mg/dL)    GFR calc non Af Amer 61 (*) >90 (mL/min)    GFR calc Af Amer 71 (*) >90 (mL/min)   PROTIME-INR     Status: Abnormal   Collection Time   01/05/12  5:35 AM      Component Value Range Comment   Prothrombin Time 16.1 (*) 11.6 - 15.2 (seconds)    INR 1.26  0.00 - 1.49    GLUCOSE, CAPILLARY     Status: Abnormal   Collection Time   01/05/12  6:29 AM      Component Value Range Comment   Glucose-Capillary 119 (*) 70 - 99 (mg/dL)    Comment 1 Documented in Chart      Comment 2 Notify RN     PROTIME-INR     Status: Abnormal   Collection Time   01/05/12  8:59 AM      Component Value Range Comment   Prothrombin Time 15.8 (*) 11.6 - 15.2 (seconds)    INR 1.23  0.00 - 1.49    GLUCOSE, CAPILLARY     Status: Abnormal   Collection Time   01/05/12 10:46 AM      Component Value Range Comment   Glucose-Capillary 150 (*) 70 - 99 (mg/dL)    Comment 1 Notify RN       Treatments: IV hydration, antibiotics: Ancef, analgesia: oxycodone, anticoagulation: warfarin, therapies: PT, OT, RN and SW and surgery: right total knee  Discharge Exam: Blood pressure 121/55, pulse 115, temperature 99.4 F (37.4 C), temperature source Oral, resp. rate 20, height 5\' 11"  (1.803 m), weight 114.701 kg (252 lb 13.9 oz), SpO2 95.00%. General appearance: alert, cooperative and appears stated age Resp: clear to auscultation bilaterally Skin: Skin color, texture, turgor normal. No rashes or lesions Neurologic: Grossly normal Incision/Wound:  Well approximated.  Scant drainage.  Slightly red.    Disposition: stable  Discharge Orders    Future Orders Please Complete By Expires   INR/PT      Diet - low sodium heart healthy      Call MD / Call 911      Comments:   If you experience chest pain or shortness of breath, CALL 911 and be  transported to the hospital emergency room.  If you develope a fever above 101 F, pus (white drainage) or increased drainage or redness at the wound, or calf pain, call your surgeon's office.   Constipation Prevention      Comments:   Drink plenty of fluids.  Prune juice may be helpful.  You may use a stool softener, such as Colace (over the counter) 100 mg twice a day.  Use MiraLax (over the counter) for constipation as needed.   Increase activity slowly as tolerated      Weight Bearing as taught in Physical Therapy      Comments:   Use a walker or crutches as instructed.   Discharge instructions      Comments:   Total Knee Replacement Care After Refer to this sheet in the next few weeks. These discharge instructions provide you with general information on caring for yourself after you leave the hospital. Your caregiver may also give you specific instructions. Your treatment has been planned according to the most current medical practices available, but unavoidable complications sometimes occur. If you have any problems or questions after discharge, please call your caregiver. Regaining  a near full range of motion of your knee within the first 3 to 6 weeks after surgery is critical. HOME CARE INSTRUCTIONS  You may resume a normal diet and activities as directed. Perform exercises as directed.  You will receive physical therapy as directed by your caregiver.  Take showers instead of baths until informed otherwise.  Change bandages (dressings) if necessary or as directed.  Only take over-the-counter or prescription medicines for pain, discomfort, or fever as directed by your caregiver.  Eat a well-balanced diet.  Avoid lifting or driving until you are instructed otherwise.  Make an appointment to see your caregiver for stitches (suture) or staple removal as directed.  If you have been sent home with a continuous passive motion machine (CPM machine), use as directed.  SEEK MEDICAL CARE IF: You  have swelling of your calf or leg.  You develop shortness of breath or chest pain.  You have redness, swelling, or increasing pain in the wound.  There is pus or any unusual drainage coming from the surgical site.  You notice a bad smell coming from the surgical site or dressing.  The surgical site breaks open after sutures or staples have been removed.  There is persistent bleeding from the suture or staple line.  You are getting worse or are not improving.  You have any other questions or concerns.  SEEK IMMEDIATE MEDICAL CARE IF:  You have a fever.  You develop a rash.  You have difficulty breathing.  You develop any reaction or side effects to medicines given.  Your knee motion is decreasing rather than improving.  MAKE SURE YOU:  Understand these instructions.  Will watch your condition.  Will get help right away if you are not doing well or get worse.  Document Released: 04/22/2005 Document Revised: 06/15/2011 Document Reviewed: 08/05/2009 [facility] Patient Information 2012 [place], [place].   CPM      Comments:   Continuous passive motion machine (CPM):      Use the CPM from 0 to 90 for 6 hours per day.       You may break it up into 2 or 3 sessions per day.      Use CPM for 2 weeks or until you are told to stop.   TED hose      Comments:   Use stockings (TED hose) for 2 weeks on both leg(s).  You may remove them at night for sleeping.   Change dressing      Comments:   Change the dressing daily with sterile 4 x 4 inch gauze dressing and apply TED hose.  You may clean the incision with alcohol prior to redressing.   Do not put a pillow under the knee. Place it under the heel.        Medication List  As of 01/05/2012  7:13 AM   STOP taking these medications         aspirin EC 81 MG tablet         TAKE these medications         amLODipine 10 MG tablet   Commonly known as: NORVASC   Take 10 mg by mouth daily.      atorvastatin 20 MG tablet   Commonly known as:  LIPITOR   Take 20 mg by mouth daily.      cetirizine 10 MG tablet   Commonly known as: ZYRTEC   Take 10 mg by mouth daily.      citalopram 10 MG tablet  Commonly known as: CELEXA   Take 10 mg by mouth daily.      diazepam 5 MG tablet   Commonly known as: VALIUM   Take 5 mg by mouth every 6 (six) hours as needed.      diazepam 5 MG tablet   Commonly known as: VALIUM   Take 1 tablet (5 mg total) by mouth every 6 (six) hours as needed for anxiety.      DSS 100 MG Caps   Take 100 mg by mouth 2 (two) times daily.      esomeprazole 40 MG capsule   Commonly known as: NEXIUM   Take 40 mg by mouth daily before breakfast.      lisinopril 20 MG tablet   Commonly known as: PRINIVIL,ZESTRIL   Take 20 mg by mouth daily.      mirtazapine 15 MG tablet   Commonly known as: REMERON   Take 15 mg by mouth at bedtime.      ondansetron 4 MG tablet   Commonly known as: ZOFRAN   Take 1 tablet (4 mg total) by mouth every 6 (six) hours as needed for nausea.      oxyCODONE-acetaminophen 5-325 MG per tablet   Commonly known as: PERCOCET   Take 1-2 tablets by mouth every 6 (six) hours as needed for pain.      warfarin 5 MG tablet   Commonly known as: COUMADIN   Take 1 tablet (5 mg total) by mouth daily.      zolpidem 10 MG tablet   Commonly known as: AMBIEN   Take 10 mg by mouth at bedtime as needed.           Follow-up Information    Follow up with Nilda Simmer, MD on 01/16/2012. (appt time 2:30)    Contact information:   [facility] [address], [address] [place] [address] [phone]          Signed: Pascal Lux 01/05/2012, 7:13 AM

## 2012-01-06 NOTE — Progress Notes (Signed)
Clinical Social Work-CSW facilitated pt d/c to to [facility] with [facility] transport and d/c packet-No further needs-Naleah Kofoed-MSW, LCSW-[phone]-Late entry from 01/05/12-1430

## 2013-09-16 DEATH — deceased

## 2014-10-17 HISTORY — PX: COLONOSCOPY: SHX174

## 2015-10-18 HISTORY — PX: CATARACT EXTRACTION W/ INTRAOCULAR LENS  IMPLANT, BILATERAL: SHX1307

## 2015-11-09 DIAGNOSIS — Z85828 Personal history of other malignant neoplasm of skin: Secondary | ICD-10-CM | POA: Diagnosis not present

## 2015-11-09 DIAGNOSIS — L821 Other seborrheic keratosis: Secondary | ICD-10-CM | POA: Diagnosis not present

## 2015-11-09 DIAGNOSIS — L905 Scar conditions and fibrosis of skin: Secondary | ICD-10-CM | POA: Diagnosis not present

## 2015-11-09 DIAGNOSIS — L720 Epidermal cyst: Secondary | ICD-10-CM | POA: Diagnosis not present

## 2015-11-09 DIAGNOSIS — L281 Prurigo nodularis: Secondary | ICD-10-CM | POA: Diagnosis not present

## 2015-11-09 DIAGNOSIS — C44629 Squamous cell carcinoma of skin of left upper limb, including shoulder: Secondary | ICD-10-CM | POA: Diagnosis not present

## 2015-11-09 DIAGNOSIS — D485 Neoplasm of uncertain behavior of skin: Secondary | ICD-10-CM | POA: Diagnosis not present

## 2015-11-12 DIAGNOSIS — C44629 Squamous cell carcinoma of skin of left upper limb, including shoulder: Secondary | ICD-10-CM | POA: Diagnosis not present

## 2015-11-12 DIAGNOSIS — L905 Scar conditions and fibrosis of skin: Secondary | ICD-10-CM | POA: Diagnosis not present

## 2015-11-30 DIAGNOSIS — R609 Edema, unspecified: Secondary | ICD-10-CM | POA: Diagnosis not present

## 2015-11-30 DIAGNOSIS — R600 Localized edema: Secondary | ICD-10-CM | POA: Diagnosis not present

## 2015-11-30 DIAGNOSIS — E1142 Type 2 diabetes mellitus with diabetic polyneuropathy: Secondary | ICD-10-CM | POA: Diagnosis not present

## 2015-12-11 DIAGNOSIS — L03115 Cellulitis of right lower limb: Secondary | ICD-10-CM | POA: Diagnosis not present

## 2015-12-11 DIAGNOSIS — Z6837 Body mass index (BMI) 37.0-37.9, adult: Secondary | ICD-10-CM | POA: Diagnosis not present

## 2015-12-11 DIAGNOSIS — Z87891 Personal history of nicotine dependence: Secondary | ICD-10-CM | POA: Diagnosis not present

## 2016-01-11 DIAGNOSIS — E7800 Pure hypercholesterolemia, unspecified: Secondary | ICD-10-CM | POA: Diagnosis not present

## 2016-01-11 DIAGNOSIS — I739 Peripheral vascular disease, unspecified: Secondary | ICD-10-CM | POA: Diagnosis not present

## 2016-01-11 DIAGNOSIS — I10 Essential (primary) hypertension: Secondary | ICD-10-CM | POA: Diagnosis not present

## 2016-01-18 DIAGNOSIS — L03114 Cellulitis of left upper limb: Secondary | ICD-10-CM | POA: Diagnosis not present

## 2016-01-18 DIAGNOSIS — L820 Inflamed seborrheic keratosis: Secondary | ICD-10-CM | POA: Diagnosis not present

## 2016-01-18 DIAGNOSIS — C44622 Squamous cell carcinoma of skin of right upper limb, including shoulder: Secondary | ICD-10-CM | POA: Diagnosis not present

## 2016-01-18 DIAGNOSIS — L570 Actinic keratosis: Secondary | ICD-10-CM | POA: Diagnosis not present

## 2016-01-18 DIAGNOSIS — C44619 Basal cell carcinoma of skin of left upper limb, including shoulder: Secondary | ICD-10-CM | POA: Diagnosis not present

## 2016-01-18 DIAGNOSIS — Z85828 Personal history of other malignant neoplasm of skin: Secondary | ICD-10-CM | POA: Diagnosis not present

## 2016-01-18 DIAGNOSIS — C44629 Squamous cell carcinoma of skin of left upper limb, including shoulder: Secondary | ICD-10-CM | POA: Diagnosis not present

## 2016-01-18 DIAGNOSIS — D485 Neoplasm of uncertain behavior of skin: Secondary | ICD-10-CM | POA: Diagnosis not present

## 2016-01-18 DIAGNOSIS — B009 Herpesviral infection, unspecified: Secondary | ICD-10-CM | POA: Diagnosis not present

## 2016-01-18 DIAGNOSIS — L720 Epidermal cyst: Secondary | ICD-10-CM | POA: Diagnosis not present

## 2016-01-18 DIAGNOSIS — L98491 Non-pressure chronic ulcer of skin of other sites limited to breakdown of skin: Secondary | ICD-10-CM | POA: Diagnosis not present

## 2016-01-18 DIAGNOSIS — L821 Other seborrheic keratosis: Secondary | ICD-10-CM | POA: Diagnosis not present

## 2016-01-18 DIAGNOSIS — L0212 Furuncle of neck: Secondary | ICD-10-CM | POA: Diagnosis not present

## 2016-01-18 DIAGNOSIS — L905 Scar conditions and fibrosis of skin: Secondary | ICD-10-CM | POA: Diagnosis not present

## 2016-01-18 DIAGNOSIS — L0211 Cutaneous abscess of neck: Secondary | ICD-10-CM | POA: Diagnosis not present

## 2016-01-20 DIAGNOSIS — C44619 Basal cell carcinoma of skin of left upper limb, including shoulder: Secondary | ICD-10-CM | POA: Diagnosis not present

## 2016-01-20 DIAGNOSIS — L905 Scar conditions and fibrosis of skin: Secondary | ICD-10-CM | POA: Diagnosis not present

## 2016-01-29 DIAGNOSIS — E119 Type 2 diabetes mellitus without complications: Secondary | ICD-10-CM | POA: Diagnosis not present

## 2016-01-29 DIAGNOSIS — E7800 Pure hypercholesterolemia, unspecified: Secondary | ICD-10-CM | POA: Diagnosis not present

## 2016-01-29 DIAGNOSIS — M159 Polyosteoarthritis, unspecified: Secondary | ICD-10-CM | POA: Diagnosis not present

## 2016-01-29 DIAGNOSIS — I10 Essential (primary) hypertension: Secondary | ICD-10-CM | POA: Diagnosis not present

## 2016-02-11 DIAGNOSIS — R609 Edema, unspecified: Secondary | ICD-10-CM | POA: Diagnosis not present

## 2016-02-11 DIAGNOSIS — E7800 Pure hypercholesterolemia, unspecified: Secondary | ICD-10-CM | POA: Diagnosis not present

## 2016-02-24 DIAGNOSIS — E785 Hyperlipidemia, unspecified: Secondary | ICD-10-CM | POA: Diagnosis not present

## 2016-02-24 DIAGNOSIS — K219 Gastro-esophageal reflux disease without esophagitis: Secondary | ICD-10-CM | POA: Diagnosis not present

## 2016-02-24 DIAGNOSIS — N400 Enlarged prostate without lower urinary tract symptoms: Secondary | ICD-10-CM | POA: Diagnosis not present

## 2016-02-24 DIAGNOSIS — E119 Type 2 diabetes mellitus without complications: Secondary | ICD-10-CM | POA: Diagnosis not present

## 2016-02-24 DIAGNOSIS — Z79899 Other long term (current) drug therapy: Secondary | ICD-10-CM | POA: Diagnosis not present

## 2016-02-24 DIAGNOSIS — F329 Major depressive disorder, single episode, unspecified: Secondary | ICD-10-CM | POA: Diagnosis not present

## 2016-02-24 DIAGNOSIS — Z888 Allergy status to other drugs, medicaments and biological substances status: Secondary | ICD-10-CM | POA: Diagnosis not present

## 2016-02-24 DIAGNOSIS — Z7982 Long term (current) use of aspirin: Secondary | ICD-10-CM | POA: Diagnosis not present

## 2016-02-24 DIAGNOSIS — I10 Essential (primary) hypertension: Secondary | ICD-10-CM | POA: Diagnosis not present

## 2016-02-24 DIAGNOSIS — I890 Lymphedema, not elsewhere classified: Secondary | ICD-10-CM | POA: Diagnosis not present

## 2016-02-24 DIAGNOSIS — K7460 Unspecified cirrhosis of liver: Secondary | ICD-10-CM | POA: Diagnosis not present

## 2016-02-26 DIAGNOSIS — I2510 Atherosclerotic heart disease of native coronary artery without angina pectoris: Secondary | ICD-10-CM | POA: Diagnosis not present

## 2016-02-26 DIAGNOSIS — K429 Umbilical hernia without obstruction or gangrene: Secondary | ICD-10-CM | POA: Diagnosis not present

## 2016-02-26 DIAGNOSIS — K7460 Unspecified cirrhosis of liver: Secondary | ICD-10-CM | POA: Diagnosis not present

## 2016-02-26 DIAGNOSIS — R635 Abnormal weight gain: Secondary | ICD-10-CM | POA: Diagnosis not present

## 2016-02-26 DIAGNOSIS — K8689 Other specified diseases of pancreas: Secondary | ICD-10-CM | POA: Diagnosis not present

## 2016-02-26 DIAGNOSIS — N289 Disorder of kidney and ureter, unspecified: Secondary | ICD-10-CM | POA: Diagnosis not present

## 2016-02-26 DIAGNOSIS — I890 Lymphedema, not elsewhere classified: Secondary | ICD-10-CM | POA: Diagnosis not present

## 2016-02-29 DIAGNOSIS — E7800 Pure hypercholesterolemia, unspecified: Secondary | ICD-10-CM | POA: Diagnosis not present

## 2016-02-29 DIAGNOSIS — I10 Essential (primary) hypertension: Secondary | ICD-10-CM | POA: Diagnosis not present

## 2016-02-29 DIAGNOSIS — E119 Type 2 diabetes mellitus without complications: Secondary | ICD-10-CM | POA: Diagnosis not present

## 2016-02-29 DIAGNOSIS — M159 Polyosteoarthritis, unspecified: Secondary | ICD-10-CM | POA: Diagnosis not present

## 2016-03-02 DIAGNOSIS — Z1211 Encounter for screening for malignant neoplasm of colon: Secondary | ICD-10-CM | POA: Diagnosis not present

## 2016-03-02 DIAGNOSIS — Z299 Encounter for prophylactic measures, unspecified: Secondary | ICD-10-CM | POA: Diagnosis not present

## 2016-03-02 DIAGNOSIS — E1165 Type 2 diabetes mellitus with hyperglycemia: Secondary | ICD-10-CM | POA: Diagnosis not present

## 2016-03-02 DIAGNOSIS — Z7189 Other specified counseling: Secondary | ICD-10-CM | POA: Diagnosis not present

## 2016-03-02 DIAGNOSIS — Z1389 Encounter for screening for other disorder: Secondary | ICD-10-CM | POA: Diagnosis not present

## 2016-03-02 DIAGNOSIS — F329 Major depressive disorder, single episode, unspecified: Secondary | ICD-10-CM | POA: Diagnosis not present

## 2016-03-02 DIAGNOSIS — E7800 Pure hypercholesterolemia, unspecified: Secondary | ICD-10-CM | POA: Diagnosis not present

## 2016-03-02 DIAGNOSIS — Z0000 Encounter for general adult medical examination without abnormal findings: Secondary | ICD-10-CM | POA: Diagnosis not present

## 2016-03-02 DIAGNOSIS — Z125 Encounter for screening for malignant neoplasm of prostate: Secondary | ICD-10-CM | POA: Diagnosis not present

## 2016-03-02 DIAGNOSIS — R5383 Other fatigue: Secondary | ICD-10-CM | POA: Diagnosis not present

## 2016-03-09 DIAGNOSIS — Z888 Allergy status to other drugs, medicaments and biological substances status: Secondary | ICD-10-CM | POA: Diagnosis not present

## 2016-03-09 DIAGNOSIS — K8591 Acute pancreatitis with uninfected necrosis, unspecified: Secondary | ICD-10-CM | POA: Diagnosis not present

## 2016-03-09 DIAGNOSIS — K219 Gastro-esophageal reflux disease without esophagitis: Secondary | ICD-10-CM | POA: Diagnosis not present

## 2016-03-09 DIAGNOSIS — I890 Lymphedema, not elsewhere classified: Secondary | ICD-10-CM | POA: Diagnosis not present

## 2016-03-09 DIAGNOSIS — F329 Major depressive disorder, single episode, unspecified: Secondary | ICD-10-CM | POA: Diagnosis not present

## 2016-03-09 DIAGNOSIS — Z79899 Other long term (current) drug therapy: Secondary | ICD-10-CM | POA: Diagnosis not present

## 2016-03-09 DIAGNOSIS — I10 Essential (primary) hypertension: Secondary | ICD-10-CM | POA: Diagnosis not present

## 2016-03-09 DIAGNOSIS — K7460 Unspecified cirrhosis of liver: Secondary | ICD-10-CM | POA: Diagnosis not present

## 2016-03-09 DIAGNOSIS — E785 Hyperlipidemia, unspecified: Secondary | ICD-10-CM | POA: Diagnosis not present

## 2016-03-09 DIAGNOSIS — Z7982 Long term (current) use of aspirin: Secondary | ICD-10-CM | POA: Diagnosis not present

## 2016-03-09 DIAGNOSIS — N400 Enlarged prostate without lower urinary tract symptoms: Secondary | ICD-10-CM | POA: Diagnosis not present

## 2016-04-04 DIAGNOSIS — R600 Localized edema: Secondary | ICD-10-CM | POA: Diagnosis not present

## 2016-04-04 DIAGNOSIS — G8929 Other chronic pain: Secondary | ICD-10-CM | POA: Diagnosis not present

## 2016-04-04 DIAGNOSIS — K7460 Unspecified cirrhosis of liver: Secondary | ICD-10-CM | POA: Diagnosis not present

## 2016-04-04 DIAGNOSIS — M25559 Pain in unspecified hip: Secondary | ICD-10-CM | POA: Diagnosis not present

## 2016-05-05 DIAGNOSIS — M7550 Bursitis of unspecified shoulder: Secondary | ICD-10-CM | POA: Diagnosis not present

## 2016-05-05 DIAGNOSIS — I10 Essential (primary) hypertension: Secondary | ICD-10-CM | POA: Diagnosis not present

## 2016-05-05 DIAGNOSIS — E7800 Pure hypercholesterolemia, unspecified: Secondary | ICD-10-CM | POA: Diagnosis not present

## 2016-05-05 DIAGNOSIS — F329 Major depressive disorder, single episode, unspecified: Secondary | ICD-10-CM | POA: Diagnosis not present

## 2016-05-05 DIAGNOSIS — Z299 Encounter for prophylactic measures, unspecified: Secondary | ICD-10-CM | POA: Diagnosis not present

## 2016-05-18 DIAGNOSIS — B009 Herpesviral infection, unspecified: Secondary | ICD-10-CM | POA: Diagnosis not present

## 2016-05-18 DIAGNOSIS — L820 Inflamed seborrheic keratosis: Secondary | ICD-10-CM | POA: Diagnosis not present

## 2016-05-18 DIAGNOSIS — Z85828 Personal history of other malignant neoplasm of skin: Secondary | ICD-10-CM | POA: Diagnosis not present

## 2016-05-18 DIAGNOSIS — L720 Epidermal cyst: Secondary | ICD-10-CM | POA: Diagnosis not present

## 2016-05-18 DIAGNOSIS — L905 Scar conditions and fibrosis of skin: Secondary | ICD-10-CM | POA: Diagnosis not present

## 2016-05-18 DIAGNOSIS — L570 Actinic keratosis: Secondary | ICD-10-CM | POA: Diagnosis not present

## 2016-05-18 DIAGNOSIS — L821 Other seborrheic keratosis: Secondary | ICD-10-CM | POA: Diagnosis not present

## 2016-05-19 DIAGNOSIS — K7460 Unspecified cirrhosis of liver: Secondary | ICD-10-CM | POA: Diagnosis not present

## 2016-05-19 DIAGNOSIS — D126 Benign neoplasm of colon, unspecified: Secondary | ICD-10-CM | POA: Diagnosis not present

## 2016-05-25 DIAGNOSIS — E119 Type 2 diabetes mellitus without complications: Secondary | ICD-10-CM | POA: Diagnosis not present

## 2016-05-25 DIAGNOSIS — I10 Essential (primary) hypertension: Secondary | ICD-10-CM | POA: Diagnosis not present

## 2016-05-25 DIAGNOSIS — M159 Polyosteoarthritis, unspecified: Secondary | ICD-10-CM | POA: Diagnosis not present

## 2016-05-25 DIAGNOSIS — E7800 Pure hypercholesterolemia, unspecified: Secondary | ICD-10-CM | POA: Diagnosis not present

## 2016-05-26 DIAGNOSIS — I517 Cardiomegaly: Secondary | ICD-10-CM | POA: Diagnosis not present

## 2016-05-26 DIAGNOSIS — I340 Nonrheumatic mitral (valve) insufficiency: Secondary | ICD-10-CM | POA: Diagnosis not present

## 2016-05-26 DIAGNOSIS — I509 Heart failure, unspecified: Secondary | ICD-10-CM | POA: Diagnosis not present

## 2016-06-16 DIAGNOSIS — K7460 Unspecified cirrhosis of liver: Secondary | ICD-10-CM | POA: Diagnosis not present

## 2016-06-17 HISTORY — PX: ESOPHAGOGASTRODUODENOSCOPY: SHX1529

## 2016-06-30 DIAGNOSIS — E119 Type 2 diabetes mellitus without complications: Secondary | ICD-10-CM | POA: Diagnosis not present

## 2016-06-30 DIAGNOSIS — M159 Polyosteoarthritis, unspecified: Secondary | ICD-10-CM | POA: Diagnosis not present

## 2016-06-30 DIAGNOSIS — E7800 Pure hypercholesterolemia, unspecified: Secondary | ICD-10-CM | POA: Diagnosis not present

## 2016-06-30 DIAGNOSIS — I10 Essential (primary) hypertension: Secondary | ICD-10-CM | POA: Diagnosis not present

## 2016-07-01 DIAGNOSIS — E669 Obesity, unspecified: Secondary | ICD-10-CM | POA: Diagnosis not present

## 2016-07-01 DIAGNOSIS — M1990 Unspecified osteoarthritis, unspecified site: Secondary | ICD-10-CM | POA: Diagnosis not present

## 2016-07-01 DIAGNOSIS — Z79899 Other long term (current) drug therapy: Secondary | ICD-10-CM | POA: Diagnosis not present

## 2016-07-01 DIAGNOSIS — Z888 Allergy status to other drugs, medicaments and biological substances status: Secondary | ICD-10-CM | POA: Diagnosis not present

## 2016-07-01 DIAGNOSIS — G4730 Sleep apnea, unspecified: Secondary | ICD-10-CM | POA: Diagnosis not present

## 2016-07-01 DIAGNOSIS — I10 Essential (primary) hypertension: Secondary | ICD-10-CM | POA: Diagnosis not present

## 2016-07-01 DIAGNOSIS — I8500 Esophageal varices without bleeding: Secondary | ICD-10-CM | POA: Diagnosis not present

## 2016-07-01 DIAGNOSIS — K7460 Unspecified cirrhosis of liver: Secondary | ICD-10-CM | POA: Diagnosis not present

## 2016-07-01 DIAGNOSIS — E1121 Type 2 diabetes mellitus with diabetic nephropathy: Secondary | ICD-10-CM | POA: Diagnosis not present

## 2016-07-01 DIAGNOSIS — I8510 Secondary esophageal varices without bleeding: Secondary | ICD-10-CM | POA: Diagnosis not present

## 2016-07-01 DIAGNOSIS — Z6837 Body mass index (BMI) 37.0-37.9, adult: Secondary | ICD-10-CM | POA: Diagnosis not present

## 2016-07-01 DIAGNOSIS — E7800 Pure hypercholesterolemia, unspecified: Secondary | ICD-10-CM | POA: Diagnosis not present

## 2016-07-01 DIAGNOSIS — Z87891 Personal history of nicotine dependence: Secondary | ICD-10-CM | POA: Diagnosis not present

## 2016-07-01 DIAGNOSIS — Z808 Family history of malignant neoplasm of other organs or systems: Secondary | ICD-10-CM | POA: Diagnosis not present

## 2016-07-01 DIAGNOSIS — Z8489 Family history of other specified conditions: Secondary | ICD-10-CM | POA: Diagnosis not present

## 2016-07-01 DIAGNOSIS — G4700 Insomnia, unspecified: Secondary | ICD-10-CM | POA: Diagnosis not present

## 2016-07-01 DIAGNOSIS — Z7982 Long term (current) use of aspirin: Secondary | ICD-10-CM | POA: Diagnosis not present

## 2016-07-01 DIAGNOSIS — K219 Gastro-esophageal reflux disease without esophagitis: Secondary | ICD-10-CM | POA: Diagnosis not present

## 2016-07-01 DIAGNOSIS — N400 Enlarged prostate without lower urinary tract symptoms: Secondary | ICD-10-CM | POA: Diagnosis not present

## 2016-07-01 DIAGNOSIS — Z85828 Personal history of other malignant neoplasm of skin: Secondary | ICD-10-CM | POA: Diagnosis not present

## 2016-07-07 DIAGNOSIS — Z6838 Body mass index (BMI) 38.0-38.9, adult: Secondary | ICD-10-CM | POA: Diagnosis not present

## 2016-07-07 DIAGNOSIS — E1142 Type 2 diabetes mellitus with diabetic polyneuropathy: Secondary | ICD-10-CM | POA: Diagnosis not present

## 2016-07-07 DIAGNOSIS — F329 Major depressive disorder, single episode, unspecified: Secondary | ICD-10-CM | POA: Diagnosis not present

## 2016-07-07 DIAGNOSIS — I739 Peripheral vascular disease, unspecified: Secondary | ICD-10-CM | POA: Diagnosis not present

## 2016-07-13 DIAGNOSIS — G4710 Hypersomnia, unspecified: Secondary | ICD-10-CM | POA: Diagnosis not present

## 2016-07-15 DIAGNOSIS — G4730 Sleep apnea, unspecified: Secondary | ICD-10-CM | POA: Diagnosis not present

## 2016-07-28 DIAGNOSIS — I10 Essential (primary) hypertension: Secondary | ICD-10-CM | POA: Diagnosis not present

## 2016-07-28 DIAGNOSIS — M159 Polyosteoarthritis, unspecified: Secondary | ICD-10-CM | POA: Diagnosis not present

## 2016-07-28 DIAGNOSIS — E7800 Pure hypercholesterolemia, unspecified: Secondary | ICD-10-CM | POA: Diagnosis not present

## 2016-07-28 DIAGNOSIS — E119 Type 2 diabetes mellitus without complications: Secondary | ICD-10-CM | POA: Diagnosis not present

## 2016-08-01 DIAGNOSIS — K7460 Unspecified cirrhosis of liver: Secondary | ICD-10-CM | POA: Diagnosis not present

## 2016-08-08 DIAGNOSIS — Z299 Encounter for prophylactic measures, unspecified: Secondary | ICD-10-CM | POA: Diagnosis not present

## 2016-08-08 DIAGNOSIS — Z713 Dietary counseling and surveillance: Secondary | ICD-10-CM | POA: Diagnosis not present

## 2016-08-08 DIAGNOSIS — Z6838 Body mass index (BMI) 38.0-38.9, adult: Secondary | ICD-10-CM | POA: Diagnosis not present

## 2016-08-08 DIAGNOSIS — E1142 Type 2 diabetes mellitus with diabetic polyneuropathy: Secondary | ICD-10-CM | POA: Diagnosis not present

## 2016-08-25 DIAGNOSIS — I10 Essential (primary) hypertension: Secondary | ICD-10-CM | POA: Diagnosis not present

## 2016-08-25 DIAGNOSIS — E119 Type 2 diabetes mellitus without complications: Secondary | ICD-10-CM | POA: Diagnosis not present

## 2016-08-25 DIAGNOSIS — M159 Polyosteoarthritis, unspecified: Secondary | ICD-10-CM | POA: Diagnosis not present

## 2016-08-25 DIAGNOSIS — E7800 Pure hypercholesterolemia, unspecified: Secondary | ICD-10-CM | POA: Diagnosis not present

## 2016-08-25 DIAGNOSIS — Z23 Encounter for immunization: Secondary | ICD-10-CM | POA: Diagnosis not present

## 2016-10-05 DIAGNOSIS — K7460 Unspecified cirrhosis of liver: Secondary | ICD-10-CM | POA: Diagnosis not present

## 2016-10-07 DIAGNOSIS — E1142 Type 2 diabetes mellitus with diabetic polyneuropathy: Secondary | ICD-10-CM | POA: Diagnosis not present

## 2016-10-07 DIAGNOSIS — G4710 Hypersomnia, unspecified: Secondary | ICD-10-CM | POA: Diagnosis not present

## 2016-10-07 DIAGNOSIS — K7460 Unspecified cirrhosis of liver: Secondary | ICD-10-CM | POA: Diagnosis not present

## 2016-10-07 DIAGNOSIS — Z299 Encounter for prophylactic measures, unspecified: Secondary | ICD-10-CM | POA: Diagnosis not present

## 2016-11-01 DIAGNOSIS — Z299 Encounter for prophylactic measures, unspecified: Secondary | ICD-10-CM | POA: Diagnosis not present

## 2016-11-01 DIAGNOSIS — Z6837 Body mass index (BMI) 37.0-37.9, adult: Secondary | ICD-10-CM | POA: Diagnosis not present

## 2016-11-01 DIAGNOSIS — J111 Influenza due to unidentified influenza virus with other respiratory manifestations: Secondary | ICD-10-CM | POA: Diagnosis not present

## 2016-11-01 DIAGNOSIS — R509 Fever, unspecified: Secondary | ICD-10-CM | POA: Diagnosis not present

## 2016-11-01 DIAGNOSIS — E1142 Type 2 diabetes mellitus with diabetic polyneuropathy: Secondary | ICD-10-CM | POA: Diagnosis not present

## 2016-11-01 DIAGNOSIS — I10 Essential (primary) hypertension: Secondary | ICD-10-CM | POA: Diagnosis not present

## 2016-11-01 DIAGNOSIS — Z87891 Personal history of nicotine dependence: Secondary | ICD-10-CM | POA: Diagnosis not present

## 2016-11-07 DIAGNOSIS — E119 Type 2 diabetes mellitus without complications: Secondary | ICD-10-CM | POA: Diagnosis not present

## 2016-11-07 DIAGNOSIS — I10 Essential (primary) hypertension: Secondary | ICD-10-CM | POA: Diagnosis not present

## 2016-11-07 DIAGNOSIS — M159 Polyosteoarthritis, unspecified: Secondary | ICD-10-CM | POA: Diagnosis not present

## 2016-11-07 DIAGNOSIS — E7800 Pure hypercholesterolemia, unspecified: Secondary | ICD-10-CM | POA: Diagnosis not present

## 2016-11-10 DIAGNOSIS — H40013 Open angle with borderline findings, low risk, bilateral: Secondary | ICD-10-CM | POA: Diagnosis not present

## 2016-11-10 DIAGNOSIS — D2311 Other benign neoplasm of skin of right eyelid, including canthus: Secondary | ICD-10-CM | POA: Diagnosis not present

## 2016-11-10 DIAGNOSIS — H04123 Dry eye syndrome of bilateral lacrimal glands: Secondary | ICD-10-CM | POA: Diagnosis not present

## 2016-11-10 DIAGNOSIS — H40019 Open angle with borderline findings, low risk, unspecified eye: Secondary | ICD-10-CM | POA: Diagnosis not present

## 2016-11-10 DIAGNOSIS — Z961 Presence of intraocular lens: Secondary | ICD-10-CM | POA: Diagnosis not present

## 2016-11-10 DIAGNOSIS — H524 Presbyopia: Secondary | ICD-10-CM | POA: Diagnosis not present

## 2016-11-14 DIAGNOSIS — Z85828 Personal history of other malignant neoplasm of skin: Secondary | ICD-10-CM | POA: Diagnosis not present

## 2016-11-14 DIAGNOSIS — L570 Actinic keratosis: Secondary | ICD-10-CM | POA: Diagnosis not present

## 2016-11-14 DIAGNOSIS — L821 Other seborrheic keratosis: Secondary | ICD-10-CM | POA: Diagnosis not present

## 2016-11-14 DIAGNOSIS — L905 Scar conditions and fibrosis of skin: Secondary | ICD-10-CM | POA: Diagnosis not present

## 2016-11-14 DIAGNOSIS — L820 Inflamed seborrheic keratosis: Secondary | ICD-10-CM | POA: Diagnosis not present

## 2016-11-18 DIAGNOSIS — F329 Major depressive disorder, single episode, unspecified: Secondary | ICD-10-CM | POA: Diagnosis not present

## 2016-11-18 DIAGNOSIS — Z713 Dietary counseling and surveillance: Secondary | ICD-10-CM | POA: Diagnosis not present

## 2016-11-18 DIAGNOSIS — E1142 Type 2 diabetes mellitus with diabetic polyneuropathy: Secondary | ICD-10-CM | POA: Diagnosis not present

## 2016-11-18 DIAGNOSIS — Z6838 Body mass index (BMI) 38.0-38.9, adult: Secondary | ICD-10-CM | POA: Diagnosis not present

## 2016-11-18 DIAGNOSIS — Z299 Encounter for prophylactic measures, unspecified: Secondary | ICD-10-CM | POA: Diagnosis not present

## 2016-11-18 DIAGNOSIS — Z789 Other specified health status: Secondary | ICD-10-CM | POA: Diagnosis not present

## 2016-11-18 DIAGNOSIS — I10 Essential (primary) hypertension: Secondary | ICD-10-CM | POA: Diagnosis not present

## 2016-11-18 DIAGNOSIS — K7460 Unspecified cirrhosis of liver: Secondary | ICD-10-CM | POA: Diagnosis not present

## 2016-11-18 DIAGNOSIS — N400 Enlarged prostate without lower urinary tract symptoms: Secondary | ICD-10-CM | POA: Diagnosis not present

## 2016-11-21 DIAGNOSIS — H04123 Dry eye syndrome of bilateral lacrimal glands: Secondary | ICD-10-CM | POA: Diagnosis not present

## 2016-12-02 DIAGNOSIS — H04123 Dry eye syndrome of bilateral lacrimal glands: Secondary | ICD-10-CM | POA: Diagnosis not present

## 2016-12-15 DIAGNOSIS — L281 Prurigo nodularis: Secondary | ICD-10-CM | POA: Diagnosis not present

## 2016-12-15 DIAGNOSIS — L570 Actinic keratosis: Secondary | ICD-10-CM | POA: Diagnosis not present

## 2016-12-15 DIAGNOSIS — Z85828 Personal history of other malignant neoplasm of skin: Secondary | ICD-10-CM | POA: Diagnosis not present

## 2016-12-15 DIAGNOSIS — L821 Other seborrheic keratosis: Secondary | ICD-10-CM | POA: Diagnosis not present

## 2016-12-15 DIAGNOSIS — L820 Inflamed seborrheic keratosis: Secondary | ICD-10-CM | POA: Diagnosis not present

## 2016-12-15 DIAGNOSIS — D485 Neoplasm of uncertain behavior of skin: Secondary | ICD-10-CM | POA: Diagnosis not present

## 2016-12-15 DIAGNOSIS — L905 Scar conditions and fibrosis of skin: Secondary | ICD-10-CM | POA: Diagnosis not present

## 2017-01-05 DIAGNOSIS — H1851 Endothelial corneal dystrophy: Secondary | ICD-10-CM | POA: Diagnosis not present

## 2017-01-05 DIAGNOSIS — Z961 Presence of intraocular lens: Secondary | ICD-10-CM | POA: Diagnosis not present

## 2017-01-05 DIAGNOSIS — H353130 Nonexudative age-related macular degeneration, bilateral, stage unspecified: Secondary | ICD-10-CM | POA: Diagnosis not present

## 2017-01-05 DIAGNOSIS — H40013 Open angle with borderline findings, low risk, bilateral: Secondary | ICD-10-CM | POA: Diagnosis not present

## 2017-01-05 DIAGNOSIS — H04123 Dry eye syndrome of bilateral lacrimal glands: Secondary | ICD-10-CM | POA: Diagnosis not present

## 2017-01-16 DIAGNOSIS — N401 Enlarged prostate with lower urinary tract symptoms: Secondary | ICD-10-CM | POA: Diagnosis not present

## 2017-01-16 DIAGNOSIS — R3912 Poor urinary stream: Secondary | ICD-10-CM | POA: Diagnosis not present

## 2017-01-16 DIAGNOSIS — R3915 Urgency of urination: Secondary | ICD-10-CM | POA: Diagnosis not present

## 2017-01-16 DIAGNOSIS — R351 Nocturia: Secondary | ICD-10-CM | POA: Diagnosis not present

## 2017-01-23 DIAGNOSIS — I10 Essential (primary) hypertension: Secondary | ICD-10-CM | POA: Diagnosis not present

## 2017-01-23 DIAGNOSIS — E119 Type 2 diabetes mellitus without complications: Secondary | ICD-10-CM | POA: Diagnosis not present

## 2017-01-23 DIAGNOSIS — M159 Polyosteoarthritis, unspecified: Secondary | ICD-10-CM | POA: Diagnosis not present

## 2017-01-23 DIAGNOSIS — E7800 Pure hypercholesterolemia, unspecified: Secondary | ICD-10-CM | POA: Diagnosis not present

## 2017-02-20 DIAGNOSIS — R351 Nocturia: Secondary | ICD-10-CM | POA: Diagnosis not present

## 2017-02-20 DIAGNOSIS — R3915 Urgency of urination: Secondary | ICD-10-CM | POA: Diagnosis not present

## 2017-02-20 DIAGNOSIS — N401 Enlarged prostate with lower urinary tract symptoms: Secondary | ICD-10-CM | POA: Diagnosis not present

## 2017-03-09 DIAGNOSIS — Z125 Encounter for screening for malignant neoplasm of prostate: Secondary | ICD-10-CM | POA: Diagnosis not present

## 2017-03-09 DIAGNOSIS — I10 Essential (primary) hypertension: Secondary | ICD-10-CM | POA: Diagnosis not present

## 2017-03-09 DIAGNOSIS — I739 Peripheral vascular disease, unspecified: Secondary | ICD-10-CM | POA: Diagnosis not present

## 2017-03-09 DIAGNOSIS — R5383 Other fatigue: Secondary | ICD-10-CM | POA: Diagnosis not present

## 2017-03-09 DIAGNOSIS — Z299 Encounter for prophylactic measures, unspecified: Secondary | ICD-10-CM | POA: Diagnosis not present

## 2017-03-09 DIAGNOSIS — Z1389 Encounter for screening for other disorder: Secondary | ICD-10-CM | POA: Diagnosis not present

## 2017-03-09 DIAGNOSIS — E1142 Type 2 diabetes mellitus with diabetic polyneuropathy: Secondary | ICD-10-CM | POA: Diagnosis not present

## 2017-03-09 DIAGNOSIS — Z6839 Body mass index (BMI) 39.0-39.9, adult: Secondary | ICD-10-CM | POA: Diagnosis not present

## 2017-03-09 DIAGNOSIS — Z79899 Other long term (current) drug therapy: Secondary | ICD-10-CM | POA: Diagnosis not present

## 2017-03-09 DIAGNOSIS — E7800 Pure hypercholesterolemia, unspecified: Secondary | ICD-10-CM | POA: Diagnosis not present

## 2017-03-09 DIAGNOSIS — F329 Major depressive disorder, single episode, unspecified: Secondary | ICD-10-CM | POA: Diagnosis not present

## 2017-03-09 DIAGNOSIS — E1165 Type 2 diabetes mellitus with hyperglycemia: Secondary | ICD-10-CM | POA: Diagnosis not present

## 2017-03-09 DIAGNOSIS — Z0000 Encounter for general adult medical examination without abnormal findings: Secondary | ICD-10-CM | POA: Diagnosis not present

## 2017-03-09 DIAGNOSIS — Z7189 Other specified counseling: Secondary | ICD-10-CM | POA: Diagnosis not present

## 2017-03-15 ENCOUNTER — Encounter: Payer: Self-pay | Admitting: Gastroenterology

## 2017-03-15 DIAGNOSIS — I10 Essential (primary) hypertension: Secondary | ICD-10-CM | POA: Diagnosis not present

## 2017-03-15 DIAGNOSIS — M159 Polyosteoarthritis, unspecified: Secondary | ICD-10-CM | POA: Diagnosis not present

## 2017-03-15 DIAGNOSIS — E7800 Pure hypercholesterolemia, unspecified: Secondary | ICD-10-CM | POA: Diagnosis not present

## 2017-03-15 DIAGNOSIS — E119 Type 2 diabetes mellitus without complications: Secondary | ICD-10-CM | POA: Diagnosis not present

## 2017-03-17 DIAGNOSIS — I10 Essential (primary) hypertension: Secondary | ICD-10-CM | POA: Diagnosis not present

## 2017-03-17 DIAGNOSIS — F329 Major depressive disorder, single episode, unspecified: Secondary | ICD-10-CM | POA: Diagnosis not present

## 2017-03-17 DIAGNOSIS — Z299 Encounter for prophylactic measures, unspecified: Secondary | ICD-10-CM | POA: Diagnosis not present

## 2017-03-17 DIAGNOSIS — K7460 Unspecified cirrhosis of liver: Secondary | ICD-10-CM | POA: Diagnosis not present

## 2017-03-17 DIAGNOSIS — Z6839 Body mass index (BMI) 39.0-39.9, adult: Secondary | ICD-10-CM | POA: Diagnosis not present

## 2017-03-17 DIAGNOSIS — E1165 Type 2 diabetes mellitus with hyperglycemia: Secondary | ICD-10-CM | POA: Diagnosis not present

## 2017-03-17 DIAGNOSIS — K219 Gastro-esophageal reflux disease without esophagitis: Secondary | ICD-10-CM | POA: Diagnosis not present

## 2017-03-17 DIAGNOSIS — E1142 Type 2 diabetes mellitus with diabetic polyneuropathy: Secondary | ICD-10-CM | POA: Diagnosis not present

## 2017-03-17 DIAGNOSIS — H6123 Impacted cerumen, bilateral: Secondary | ICD-10-CM | POA: Diagnosis not present

## 2017-03-17 DIAGNOSIS — E7800 Pure hypercholesterolemia, unspecified: Secondary | ICD-10-CM | POA: Diagnosis not present

## 2017-03-17 DIAGNOSIS — I739 Peripheral vascular disease, unspecified: Secondary | ICD-10-CM | POA: Diagnosis not present

## 2017-03-17 DIAGNOSIS — N400 Enlarged prostate without lower urinary tract symptoms: Secondary | ICD-10-CM | POA: Diagnosis not present

## 2017-03-20 DIAGNOSIS — R351 Nocturia: Secondary | ICD-10-CM | POA: Diagnosis not present

## 2017-03-20 DIAGNOSIS — N401 Enlarged prostate with lower urinary tract symptoms: Secondary | ICD-10-CM | POA: Diagnosis not present

## 2017-03-20 DIAGNOSIS — R3915 Urgency of urination: Secondary | ICD-10-CM | POA: Diagnosis not present

## 2017-04-06 DIAGNOSIS — N401 Enlarged prostate with lower urinary tract symptoms: Secondary | ICD-10-CM | POA: Diagnosis not present

## 2017-04-06 DIAGNOSIS — R3915 Urgency of urination: Secondary | ICD-10-CM | POA: Diagnosis not present

## 2017-04-06 DIAGNOSIS — R3912 Poor urinary stream: Secondary | ICD-10-CM | POA: Diagnosis not present

## 2017-04-06 DIAGNOSIS — R351 Nocturia: Secondary | ICD-10-CM | POA: Diagnosis not present

## 2017-04-21 ENCOUNTER — Encounter: Payer: Self-pay | Admitting: Gastroenterology

## 2017-04-21 ENCOUNTER — Ambulatory Visit (INDEPENDENT_AMBULATORY_CARE_PROVIDER_SITE_OTHER): Payer: Medicare Other | Admitting: Gastroenterology

## 2017-04-21 DIAGNOSIS — K7460 Unspecified cirrhosis of liver: Secondary | ICD-10-CM

## 2017-04-21 NOTE — Progress Notes (Signed)
Primary Care Physician:  Monico Blitz, MD Primary Gastroenterologist:  Dr. Oneida Alar   Chief Complaint  Patient presents with  . Cirrhosis    HPI:   Jared Martinez is a 76 y.o. male presenting today to establish care with history of cirrhosis, previously followed by Dr. Britta Mccreedy. Last seen by Dr. Britta Mccreedy in Aug 2017. He was diagnosed with cirrhosis last year. Dr. Wylie Hail note reviewed. Labs with negative Hep B surface antibody and core antibody, Hep C antibody negative. ANA negative. Ferritin 84. Hep A total antibody negative. Alpha-1 antitrypsin normal at 112. Hep B surface antigen negative. Felt to likely have NASH cirrhosis. Alcohol use historically has been minimal.    Chronic episodic diarrhea. Notes episodes about once a week with incontinence. Other days has soft BM. No confusion. Sore in left groin chronically. Hep a and b vaccine completed.  Lasix 40 mg daily. Chronic RLE edema. PCP aware. He states he was evaluated for DVT before and this was negative. Last colonoscopy in 2016 by Dr. Britta Mccreedy with multiple sessile polyps (tubular adenomas), diverticulosis in left colon, small angiodysplastic lesion in transverse and ascending colon. Last EGD in Sept 2017 with 3 columns of medium-sized varices without stigmata of bleeding. Otherwise normal. Started on Nadolol, and he is currently taking 40 mg daily.   Past Medical History:  Diagnosis Date  . Anxiety   . Cirrhosis ([place])   . Depression   . GERD (gastroesophageal reflux disease)   . Hyperlipidemia   . Hypertension   . Right knee DJD 12/27/2011  . Sleep apnea    Does NOT use his CPAP    Past Surgical History:  Procedure Laterality Date  . APPENDECTOMY    . JOINT REPLACEMENT  2006   left total knee in [place]  . PALATE / UVULA BIOPSY / EXCISION    . PILONIDAL CYST DRAINAGE    . TOTAL KNEE ARTHROPLASTY  01/02/2012   Procedure: TOTAL KNEE ARTHROPLASTY;  Surgeon: Lorn Junes, MD;  Location: [place] [facility];  Service: Orthopedics;   Laterality: Right;  DR Ephriam Knuckles 90MINUTES FOR THIS CASE    Current Outpatient Prescriptions  Medication Sig Dispense Refill  . cetirizine (ZYRTEC) 10 MG tablet Take 10 mg by mouth as needed.     . citalopram (CELEXA) 10 MG tablet Take 20 mg by mouth daily.     . cycloSPORINE (RESTASIS) 0.05 % ophthalmic emulsion Place 1 drop into both eyes daily.    . diazepam (VALIUM) 5 MG tablet Take 5 mg by mouth every 6 (six) hours as needed.    . finasteride (PROSCAR) 5 MG tablet Take 5 mg by mouth daily.    . furosemide (LASIX) 40 MG tablet Take 40 mg by mouth.    Marland Kitchen lisinopril (PRINIVIL,ZESTRIL) 20 MG tablet Take 20 mg by mouth daily.    . mirtazapine (REMERON) 15 MG tablet Take 15 mg by mouth at bedtime.    . nadolol (CORGARD) 40 MG tablet Take 40 mg by mouth daily.     No current facility-administered medications for this visit.     Allergies as of 04/21/2017 - Review Complete 04/21/2017  Allergen Reaction Noted  . Adhesive [tape]  12/19/2011    Family History  Problem Relation Age of Onset  . Alzheimer's disease Mother   . Liver cancer Father   . Arthritis Sister   . Arthritis Brother   . Colon cancer Neg Hx   . Colon polyps Neg Hx     Social History  Social History  . Marital status: Divorced    Spouse name: N/A  . Number of children: N/A  . Years of education: N/A   Occupational History  . 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 Smoking status: Former Smoker    Quit date: 12/26/1973  . Smokeless tobacco: Never Used  . Alcohol use No     Comment: some alcohol in past, a beer every 2 weeks   . Drug use: No  . Sexual activity: Not on file   Other Topics Concern  . Not on file   Social History Narrative  . No narrative on file    Review of Systems: Gen: see HPI  CV: Denies chest pain, heart palpitations, peripheral edema, syncope.  Resp: Denies shortness of breath at rest or with exertion. Denies wheezing or cough.  GI:  Denies dysphagia or odynophagia. Denies jaundice, hematemesis, fecal incontinence. GU : urinary frequency MS: +joint pain  Derm: Denies rash, itching, dry skin Psych: Denies depression, anxiety, memory loss, and confusion Heme: see HPI   Physical Exam: BP 129/81   Pulse 72   Temp (!) 97.4 F (36.3 C) (Oral)   Ht 5\' 11"  (1.803 m)   Wt 271 lb 3.2 oz (123 kg)   BMI 37.82 kg/m  General:   Alert and oriented. Pleasant and cooperative. Well-nourished and well-developed.  Head:  Normocephalic and atraumatic. Eyes:  Without icterus, sclera clear and conjunctiva pink.  Ears:  Normal auditory acuity. Nose:  No deformity, discharge,  or lesions. Mouth:  No deformity or lesions, oral mucosa pink.  Lungs:  Clear to auscultation bilaterally. No wheezes, rales, or rhonchi. No distress.  Heart:  S1, S2 present without murmurs appreciated.  Abdomen:  +BS, soft, obese, large AP diameter, umbilical hernia noted, non-tender and non-distended.  Msk:  Symmetrical without gross deformities. Normal posture. Pulses:  Normal pulses noted. Extremities:  With out edema. Neurologic:  Alert and  oriented x4 Skin:  Intact without significant lesions or rashes. Psych:  Alert and cooperative. Normal mood and affect.

## 2017-04-21 NOTE — Patient Instructions (Signed)
I have ordered a routine ultrasound of your liver.  I will get the other blood work from Dr. Manuella Ghazi. We will probably need some more. I will let you know.  Please check your pulse while sitting down at rest over the weekend and next week. Let me know if it is consistently over 55 beats per minute. We may need to adjust the Nadolol.  Your next colonoscopy is in 2019.  I will see you in 6 months or sooner if needed.

## 2017-04-22 NOTE — Assessment & Plan Note (Signed)
76 year old male with history of likely NASH cirrhosis, presenting today to establish care. No signs of decompensation. Due for routine ultrasound now for [facility] screening. Vaccinations completed. Will need to obtain lab results from Dr. Manuella Ghazi recently along with Hep C results. Will need colonoscopy in 2019 due to history of multiple adenomas if health permits. Resting heart rate in 72 range today; I have asked him to monitor this at home and call if persistently above 55 as Nadolol may need to be titrated.   Return in 6 months Ultrasound now Obtain outside labs Colonoscopy in 2019 Monitor resting heart rate

## 2017-04-23 NOTE — Progress Notes (Signed)
REVIEWED-NO ADDITIONAL RECOMMENDATIONS. 

## 2017-04-24 DIAGNOSIS — N401 Enlarged prostate with lower urinary tract symptoms: Secondary | ICD-10-CM | POA: Diagnosis not present

## 2017-04-24 DIAGNOSIS — R3915 Urgency of urination: Secondary | ICD-10-CM | POA: Diagnosis not present

## 2017-04-24 DIAGNOSIS — R351 Nocturia: Secondary | ICD-10-CM | POA: Diagnosis not present

## 2017-04-24 NOTE — Progress Notes (Signed)
cc'ed to pcp °

## 2017-04-26 ENCOUNTER — Ambulatory Visit (HOSPITAL_COMMUNITY)
Admission: RE | Admit: 2017-04-26 | Discharge: 2017-04-26 | Disposition: A | Discharge status: Home / Self Care | Payer: Medicare Other | Source: Ambulatory Visit | Attending: Gastroenterology | Admitting: Gastroenterology

## 2017-04-26 ENCOUNTER — Encounter (HOSPITAL_BASED_OUTPATIENT_CLINIC_OR_DEPARTMENT_OTHER): Payer: Self-pay | Admitting: *Deleted

## 2017-04-26 ENCOUNTER — Other Ambulatory Visit: Payer: Self-pay | Admitting: Urology

## 2017-04-26 DIAGNOSIS — K7460 Unspecified cirrhosis of liver: Secondary | ICD-10-CM | POA: Insufficient documentation

## 2017-04-26 NOTE — Progress Notes (Signed)
Pt is aware. Forwarding to Manuela Schwartz to nic the [place].

## 2017-04-26 NOTE — Progress Notes (Signed)
CIrrhosis, repeat in 6 months. Still obtaining outside records.

## 2017-04-27 ENCOUNTER — Encounter (HOSPITAL_BASED_OUTPATIENT_CLINIC_OR_DEPARTMENT_OTHER): Payer: Self-pay | Admitting: *Deleted

## 2017-04-27 ENCOUNTER — Telehealth: Payer: Self-pay | Admitting: Gastroenterology

## 2017-04-27 ENCOUNTER — Other Ambulatory Visit: Payer: Self-pay

## 2017-04-27 DIAGNOSIS — K7460 Unspecified cirrhosis of liver: Secondary | ICD-10-CM

## 2017-04-27 NOTE — Telephone Encounter (Signed)
Received request from Dr. Jeffie Pollock regarding upcoming cystoscopy and TURP, requesting clearance. We can give a post-operative mortality risk but not "clearance". In order to calculate this, I need updated labs.   Please have patient complete INR, CMP.  I have reviewed outside labs from May 2018 with Hgb 15.4, Platelets 112, BUN 16, Cr 1.27, Tbili 1.5, Alk Phos 71, ASt 48, ALT 31.   Other records with Negative Hep B total core antibody, negative ANA, Hep B surface antigen negative, Hep C antibody negative,

## 2017-04-27 NOTE — Progress Notes (Signed)
NPO AFTER MN.  ARRIVE AT 0930.  NEEDS ISTAT 8 AND EKG.  WILL TAKE CELEXA, PROSCAR, AND ZYRTEC AM DOS W/ SIPS OF WATER. PT UNSURE NAME OF PERSON RIDE/CAREGIVER BUT VERBALIZED UNDERSTANDING THAT HE DID NEED SOMEONE .

## 2017-04-27 NOTE — Telephone Encounter (Signed)
Tried to call both numbers. Home line busy. Mobile, VM not set up.  Lab orders are ready for pt.

## 2017-05-01 DIAGNOSIS — K7460 Unspecified cirrhosis of liver: Secondary | ICD-10-CM | POA: Diagnosis not present

## 2017-05-01 LAB — PROTIME-INR
INR: 1.1
Prothrombin Time: 11.6 s — ABNORMAL HIGH (ref 9.0–11.5)

## 2017-05-01 LAB — COMPREHENSIVE METABOLIC PANEL
ALT: 33 U/L (ref 9–46)
AST: 46 U/L — ABNORMAL HIGH (ref 10–35)
Albumin: 3.7 g/dL (ref 3.6–5.1)
Alkaline Phosphatase: 65 U/L (ref 40–115)
BUN: 18 mg/dL (ref 7–25)
CO2: 27 mmol/L (ref 20–31)
Calcium: 9 mg/dL (ref 8.6–10.3)
Chloride: 104 mmol/L (ref 98–110)
Creat: 1.34 mg/dL — ABNORMAL HIGH (ref 0.70–1.18)
Glucose, Bld: 230 mg/dL — ABNORMAL HIGH (ref 65–99)
Potassium: 4 mmol/L (ref 3.5–5.3)
Sodium: 140 mmol/L (ref 135–146)
Total Bilirubin: 1.4 mg/dL — ABNORMAL HIGH (ref 0.2–1.2)
Total Protein: 6.7 g/dL (ref 6.1–8.1)

## 2017-05-01 NOTE — Telephone Encounter (Addendum)
Pt called to give update on his pulse. It had been running around 75 daily. He had only been taking Nadalol 20 mg daily. He had Nadalol 40 mg at one time, but then he had 20 mg. So Friday he started taking the 40 mg tablets that he has and yesterday his BP was 136/83 and pulse was 62. His surgery is scheduled for 05/04/2017 and he is hoping to get this stable. He is aware of lab results and also of the lab orders. He will try to go to [place] today. Forwarding to Walden Field, NP who is doing hospital call today Roseanne Kaufman, NP is on vacation).   Also, pt would like Rx for Nadalol sent to [facility]. He has enough for now.

## 2017-05-02 ENCOUNTER — Encounter: Payer: Self-pay | Admitting: General Practice

## 2017-05-02 MED ORDER — NADOLOL 40 MG PO TABS: 40.0000 mg | ORAL_TABLET | Freq: Every evening | ORAL | 5 refills | Status: DC

## 2017-05-02 NOTE — Addendum Note (Signed)
Addended by: Gordy Levan, Nicholette Dolson A on: 05/02/2017 03:19 PM   Modules accepted: Orders

## 2017-05-02 NOTE — Telephone Encounter (Signed)
Note from Dr. Oneida Alar [phone]) to Dr. Ralene Muskrat office.  I also spoke with Hylton and made her aware of the post operative mortality risk.  I also spoke with Pam, surgery scheduler and made her aware of the fax.

## 2017-05-02 NOTE — Telephone Encounter (Signed)
Jared Martinez, this pt is scheduled for TURP on Thursday, 05/04/2017. Please address so I can let pt know. He did his labs yesterday also. Thanks so much for your help in Anna's absence.

## 2017-05-02 NOTE — Telephone Encounter (Signed)
POST-OPERATIVE MORTALITY RISK IN PATIENTS WITH CIRHHOSIS.  Probability of Mortality 7 days 30 days 90 days 1 year 5 years  % 3.873 % 14.961 % 23.03 % 43.779 % 79.702   PLEASE FAX TO DR. Ralene Muskrat OFFICE.

## 2017-05-02 NOTE — Telephone Encounter (Signed)
The increased Nadolol dose is controlling his HR much closer to target with a continued safe BP. Keep taking the 40 mg tablets. Call us if his BP gets low or if he has symptoms of low BP/HR such as dizziness, lightheadedness, passing out, etc.   Rx for 40 mg tabs sent to pharmacy.

## 2017-05-02 NOTE — Telephone Encounter (Signed)
LMOM to call.

## 2017-05-02 NOTE — Telephone Encounter (Signed)
Jared Martinez asked me to see if you will address the part about pt's Nadalol. Thanks!

## 2017-05-03 NOTE — Telephone Encounter (Signed)
Pt is aware of the information and also that the info has been faxed to the surgeon.

## 2017-05-03 NOTE — H&P (Signed)
CC: BPH New Patient  HPI: Jared Martinez is a 76 year-old male established patient who is here today as a new patient for evaluation of BPH and lower tract symptoms.  He is a patient of Dr. Monico Martinez seen in office consultation today. The patient complains of lower urinary tract symptom(s) that include frequency, urgency, weak stream, and nocturia. The patient states his most bothersome symptom(s) are the following: urgency. The patient states if he were to spend the rest of his life with his current urinary condition, he would be unhappy.   Jared Martinez returns today in f/u from [facility]. He had a 635ml capacity bladder with delayed sensation to about 441ml and some mild instability. He voided with a PF of 70ml/sec and a PVR of only 105ml. His voiding pressure was about 16ml. He continues to have urgency with UUI and nocturia 4x. He can't take Myrbetriq because of liver disease and he had dry mouth with peeling mucosa on oxybutynin. He had side effects with tamsulosin and finasteride made him sick. He was originally sent by Dr. Manuella Martinez for BPH with BOO. He has had symptoms for over 2 years and was originally placed on finasteride which did work but his symptoms have recurred. He is no longer on the finasteride. He has occasional dysuria but no hematuria. He has no hesitancy but has a weak stream. He is not sure if he is emptying. He has no intermittency.   Interval history 02/20/17: At his last visit, he was started on Tamsulosin. Today he returns for symptom evaluation and PVR measurement. He was unable to tolerate the Tamsulosin due to side effects from it. Per his report, he experienced nausea and diarrhea. He states that his voiding symptoms are the same. His most bothersome complain continues to be urgency and urge incontinence.   03/20/17: Jared Martinez returns today for follow up. He continues to have bothersome urgency and urge incontinence. He also complains of nocturia X 4. He denies any fevers, dysuria, gross  hematuria, or suprapubic pain. He was started on Oxybutynin at his last visit, but he experienced mouth sores/dyness/peeling and had to stop the medication. He states that he did feel that is helped some.     AUA Symptom Score: He never has the sensation of not emptying his bladder completely after finishing urinating. Almost always he has to urinate again fewer than two hours after he has finished urinating. He does not have to stop and start again several times when he urinates. Almost always he finds it difficult to postpone urination. Almost always he has a weak urinary stream. He never has to push or strain to begin urination. He has to get up to urinate 4 times from the time he goes to bed until the time he gets up in the morning.   Calculated AUA Symptom Score: 19    ALLERGIES: None   MEDICATIONS: Lisinopril 20 mg tablet  Citalopram Hbr 20 mg tablet  Diazepam 5 mg tablet  Furosemide 40 mg tablet  Mirtazapine 15 mg tablet  Nadolol 40 mg tablet  Restasis 0.05 % dropperette, single-use drop dispenser     GU PSH: Complex cystometrogram, w/ void pressure and urethral pressure profile studies, any technique - 04/06/2017 Complex Uroflow - 04/06/2017, 01/16/2017 Cystoscopy - 01/16/2017 Emg surf Electrd - 04/06/2017 Inject For cystogram - 04/06/2017 Intrabd voidng Press - 04/06/2017    NON-GU PSH: Knee replacement, Bilateral    GU PMH: BPH w/LUTS - 03/20/2017, - 02/20/2017, He has BPH with BOO  and primarily irritative symptoms. I was going to consider Myrbetriq but it is contraindictated in liver disease. I am going put him back on tamsulosin in conjunction with the finasteride and will have him return in a month for a PVR and OV. We could consider oxybutynin if the the tamsulosin doesn't help. , - 01/16/2017 Nocturia - 01/16/2017 Urinary Urgency - 01/16/2017 Weak Urinary Stream - 01/16/2017    NON-GU PMH: Depression Diabetes Type 2 GERD Hypercholesterolemia Liver Disease Sleep Apnea    FAMILY  HISTORY: Cancer - Father   SOCIAL HISTORY: Marital Status: Divorced Current Smoking Status: Patient does not smoke anymore.   Tobacco Use Assessment Completed: Used Tobacco in last 30 days? Drinks 3 caffeinated drinks per day.     Notes: 1 daughter, light smoker over 20 years ago. Rare ETOH and no history of abuse.    REVIEW OF SYSTEMS:    GU Review Male:   Patient reports frequent urination, hard to postpone urination, burning/ pain with urination, get up at night to urinate, leakage of urine, and trouble starting your stream. Patient denies stream starts and stops, have to strain to urinate , erection problems, and penile pain.  Gastrointestinal (Upper):   Patient denies nausea, vomiting, and indigestion/ heartburn.  Gastrointestinal (Lower):   Patient reports diarrhea. Patient denies constipation.  Constitutional:   Patient denies fever, night sweats, weight loss, and fatigue.  Skin:   Patient reports itching. Patient denies skin rash/ lesion.  Eyes:   Patient reports blurred vision. Patient denies double vision.  Ears/ Nose/ Throat:   Patient denies sore throat and sinus problems.  Hematologic/Lymphatic:   Patient denies swollen glands and easy bruising.  Cardiovascular:   Patient reports leg swelling. Patient denies chest pains.  Respiratory:   Patient denies cough and shortness of breath.  Endocrine:   Patient denies excessive thirst.  Musculoskeletal:   Patient reports back pain and joint pain.   Neurological:   Patient denies headaches and dizziness.  Psychologic:   Patient reports depression. Patient denies anxiety.   VITAL SIGNS:      04/24/2017 01:06 PM  Weight 270 lb / 122.47 kg  Height 71 in / 180.34 cm  BP 127/74 mmHg  Heart Rate 70 /min  BMI 37.7 kg/m   MULTI-SYSTEM PHYSICAL EXAMINATION:    Constitutional: Well-nourished. No physical deformities. Normally developed. Good grooming.  Respiratory: No labored breathing, no use of accessory muscles. CTA   Cardiovascular: Normal temperature, normal extremity pulses, no swelling, no varicosities. RRR without murmur     PAST DATA REVIEWED:  Source Of History:  Patient  Urine Test Review:   Urinalysis  Urodynamics Review:   Review Urodynamics Tests   PROCEDURES:          Urinalysis Dipstick Dipstick Cont'd  Color: Amber Bilirubin: Neg  Appearance: Clear Ketones: Neg  Specific Gravity: 1.020 Blood: Neg  pH: 6.5 Protein: Neg  Glucose: 3+ Urobilinogen: 1.0    Nitrites: Neg    Leukocyte Esterase: Neg    ASSESSMENT:      ICD-10 Details  1 GU:   BPH w/LUTS - N40.1 He has persistent LUTs with OAB and was found to have some bladder instability but he also had a high voiding pressure and has had poor responses to meds and multiple side effects. I reviewed the options including PTNS or a procedure such as a TURP and I think he will be best served with a TURP because of the voiding pressure findings. I reviewed the risks of bleeding,  infection, strictures, incontinence, persistent symptoms, sexual dysfunctiona, thrombotic events and anesthetic complications.   2   Nocturia - R35.1   3   Urinary Urgency - R39.15    PLAN:           Schedule Return Visit/Planned Activity: Next Available Appointment - Schedule Surgery          Document Letter(s):  Created for Patient: Clinical Summary         Notes:   CC: Dr. Monico Martinez and Dr. Trinda Pascal.

## 2017-05-04 ENCOUNTER — Ambulatory Visit (HOSPITAL_BASED_OUTPATIENT_CLINIC_OR_DEPARTMENT_OTHER): Payer: Medicare Other | Admitting: Anesthesiology

## 2017-05-04 ENCOUNTER — Encounter (HOSPITAL_BASED_OUTPATIENT_CLINIC_OR_DEPARTMENT_OTHER)
Admission: RE | Disposition: A | Discharge status: Home / Self Care | Payer: Self-pay | Source: Ambulatory Visit | Attending: Urology

## 2017-05-04 ENCOUNTER — Other Ambulatory Visit: Payer: Self-pay

## 2017-05-04 ENCOUNTER — Encounter (HOSPITAL_BASED_OUTPATIENT_CLINIC_OR_DEPARTMENT_OTHER): Payer: Self-pay | Admitting: *Deleted

## 2017-05-04 ENCOUNTER — Ambulatory Visit (HOSPITAL_BASED_OUTPATIENT_CLINIC_OR_DEPARTMENT_OTHER)
Admission: RE | Admit: 2017-05-04 | Discharge: 2017-05-04 | Disposition: A | Discharge status: Home / Self Care | Payer: Medicare Other | Source: Ambulatory Visit | Attending: Urology | Admitting: Urology

## 2017-05-04 DIAGNOSIS — Z79899 Other long term (current) drug therapy: Secondary | ICD-10-CM | POA: Diagnosis not present

## 2017-05-04 DIAGNOSIS — R3915 Urgency of urination: Secondary | ICD-10-CM | POA: Diagnosis not present

## 2017-05-04 DIAGNOSIS — I10 Essential (primary) hypertension: Secondary | ICD-10-CM | POA: Insufficient documentation

## 2017-05-04 DIAGNOSIS — N400 Enlarged prostate without lower urinary tract symptoms: Secondary | ICD-10-CM | POA: Diagnosis not present

## 2017-05-04 DIAGNOSIS — N401 Enlarged prostate with lower urinary tract symptoms: Secondary | ICD-10-CM | POA: Diagnosis not present

## 2017-05-04 DIAGNOSIS — N320 Bladder-neck obstruction: Secondary | ICD-10-CM | POA: Diagnosis not present

## 2017-05-04 DIAGNOSIS — E785 Hyperlipidemia, unspecified: Secondary | ICD-10-CM | POA: Diagnosis not present

## 2017-05-04 DIAGNOSIS — G4730 Sleep apnea, unspecified: Secondary | ICD-10-CM | POA: Diagnosis not present

## 2017-05-04 DIAGNOSIS — N138 Other obstructive and reflux uropathy: Secondary | ICD-10-CM | POA: Diagnosis not present

## 2017-05-04 DIAGNOSIS — K769 Liver disease, unspecified: Secondary | ICD-10-CM | POA: Insufficient documentation

## 2017-05-04 DIAGNOSIS — Z96653 Presence of artificial knee joint, bilateral: Secondary | ICD-10-CM | POA: Diagnosis not present

## 2017-05-04 DIAGNOSIS — R351 Nocturia: Secondary | ICD-10-CM | POA: Diagnosis not present

## 2017-05-04 DIAGNOSIS — Z87891 Personal history of nicotine dependence: Secondary | ICD-10-CM | POA: Diagnosis not present

## 2017-05-04 DIAGNOSIS — E119 Type 2 diabetes mellitus without complications: Secondary | ICD-10-CM | POA: Insufficient documentation

## 2017-05-04 HISTORY — PX: TRANSURETHRAL RESECTION OF PROSTATE: SHX73

## 2017-05-04 HISTORY — DX: Benign prostatic hyperplasia without lower urinary tract symptoms: N40.0

## 2017-05-04 HISTORY — DX: Unspecified osteoarthritis, unspecified site: M19.90

## 2017-05-04 HISTORY — PX: CYSTOSCOPY: SHX5120

## 2017-05-04 HISTORY — DX: Personal history of adenomatous and serrated colon polyps: Z86.0101

## 2017-05-04 HISTORY — DX: Localized edema: R60.0

## 2017-05-04 HISTORY — DX: Obstructive sleep apnea (adult) (pediatric): G47.33

## 2017-05-04 HISTORY — DX: Unspecified cirrhosis of liver: K74.60

## 2017-05-04 HISTORY — DX: Unspecified symptoms and signs involving the genitourinary system: R39.9

## 2017-05-04 HISTORY — DX: Personal history of colonic polyps: Z86.010

## 2017-05-04 LAB — POCT I-STAT, CHEM 8
BUN: 19 mg/dL (ref 6–20)
Calcium, Ion: 1.18 mmol/L (ref 1.15–1.40)
Chloride: 102 mmol/L (ref 101–111)
Creatinine, Ser: 1.2 mg/dL (ref 0.61–1.24)
Glucose, Bld: 143 mg/dL — ABNORMAL HIGH (ref 65–99)
HCT: 56 % — ABNORMAL HIGH (ref 39.0–52.0)
Hemoglobin: 19 g/dL — ABNORMAL HIGH (ref 13.0–17.0)
Potassium: 3.9 mmol/L (ref 3.5–5.1)
Sodium: 144 mmol/L (ref 135–145)
TCO2: 30 mmol/L (ref 0–100)

## 2017-05-04 SURGERY — TURP (TRANSURETHRAL RESECTION OF PROSTATE)
Anesthesia: General | Site: Renal

## 2017-05-04 MED ORDER — PROPOFOL 10 MG/ML IV BOLUS
INTRAVENOUS | Status: DC | PRN
  Administered 2017-05-04: 250 mg via INTRAVENOUS

## 2017-05-04 MED ORDER — ONDANSETRON HCL 4 MG/2ML IJ SOLN
INTRAMUSCULAR | Status: AC
  Filled 2017-05-04: qty 2

## 2017-05-04 MED ORDER — SODIUM CHLORIDE 0.9 % IR SOLN
Status: DC | PRN
  Administered 2017-05-04: 9000 mL

## 2017-05-04 MED ORDER — PHENYLEPHRINE 40 MCG/ML (10ML) SYRINGE FOR IV PUSH (FOR BLOOD PRESSURE SUPPORT)
PREFILLED_SYRINGE | INTRAVENOUS | Status: DC | PRN
  Administered 2017-05-04: 80 ug via INTRAVENOUS
  Administered 2017-05-04: 120 ug via INTRAVENOUS

## 2017-05-04 MED ORDER — ASPIRIN EC 81 MG PO TBEC: 81.0000 mg | DELAYED_RELEASE_TABLET | Freq: Every day | ORAL | Status: DC

## 2017-05-04 MED ORDER — DEXAMETHASONE SODIUM PHOSPHATE 10 MG/ML IJ SOLN
INTRAMUSCULAR | Status: DC | PRN
  Administered 2017-05-04: 10 mg via INTRAVENOUS

## 2017-05-04 MED ORDER — DEXAMETHASONE SODIUM PHOSPHATE 10 MG/ML IJ SOLN
INTRAMUSCULAR | Status: AC
  Filled 2017-05-04: qty 1

## 2017-05-04 MED ORDER — WHITE PETROLATUM GEL
Status: AC
  Filled 2017-05-04: qty 5

## 2017-05-04 MED ORDER — ONDANSETRON HCL 4 MG/2ML IJ SOLN
INTRAMUSCULAR | Status: DC | PRN
  Administered 2017-05-04: 4 mg via INTRAVENOUS

## 2017-05-04 MED ORDER — FENTANYL CITRATE (PF) 100 MCG/2ML IJ SOLN
INTRAMUSCULAR | Status: AC
  Filled 2017-05-04: qty 2

## 2017-05-04 MED ORDER — FENTANYL CITRATE (PF) 100 MCG/2ML IJ SOLN
INTRAMUSCULAR | Status: DC | PRN
  Administered 2017-05-04 (×2): 25 ug via INTRAVENOUS
  Administered 2017-05-04: 50 ug via INTRAVENOUS

## 2017-05-04 MED ORDER — PROPOFOL 10 MG/ML IV BOLUS
INTRAVENOUS | Status: AC
  Filled 2017-05-04: qty 40

## 2017-05-04 MED ORDER — LIDOCAINE 2% (20 MG/ML) 5 ML SYRINGE
INTRAMUSCULAR | Status: DC | PRN
  Administered 2017-05-04: 100 mg via INTRAVENOUS

## 2017-05-04 MED ORDER — LIDOCAINE 2% (20 MG/ML) 5 ML SYRINGE
INTRAMUSCULAR | Status: AC
  Filled 2017-05-04: qty 5

## 2017-05-04 MED ORDER — LACTATED RINGERS IV SOLN
INTRAVENOUS | Status: DC
  Administered 2017-05-04: 10:00:00 via INTRAVENOUS
  Filled 2017-05-04: qty 1000

## 2017-05-04 MED ORDER — PHENYLEPHRINE 40 MCG/ML (10ML) SYRINGE FOR IV PUSH (FOR BLOOD PRESSURE SUPPORT)
PREFILLED_SYRINGE | INTRAVENOUS | Status: AC
  Filled 2017-05-04: qty 10

## 2017-05-04 MED ORDER — HYDROCODONE-ACETAMINOPHEN 5-325 MG PO TABS: 1.0000 | ORAL_TABLET | Freq: Four times a day (QID) | ORAL | 0 refills | Status: DC | PRN

## 2017-05-04 MED ORDER — CEFAZOLIN SODIUM-DEXTROSE 2-4 GM/100ML-% IV SOLN
2.0000 g | INTRAVENOUS | Status: AC
  Administered 2017-05-04: 2 g via INTRAVENOUS
  Filled 2017-05-04: qty 100

## 2017-05-04 MED ORDER — CEFAZOLIN SODIUM-DEXTROSE 2-4 GM/100ML-% IV SOLN
INTRAVENOUS | Status: AC
  Filled 2017-05-04: qty 100

## 2017-05-04 SURGICAL SUPPLY — 52 items
BAG DRAIN URO-CYSTO SKYTR STRL (DRAIN) ×4 IMPLANT
BAG DRN ANRFLXCHMBR STRAP LEK (BAG)
BAG DRN UROCATH (DRAIN) ×2
BAG URINE DRAINAGE (UROLOGICAL SUPPLIES) IMPLANT
BAG URINE LEG 19OZ MD ST LTX (BAG) IMPLANT
BAG URINE LEG 500ML (DRAIN) ×4 IMPLANT
CATH FOLEY 2WAY SLVR  5CC 16FR (CATHETERS)
CATH FOLEY 2WAY SLVR  5CC 20FR (CATHETERS)
CATH FOLEY 2WAY SLVR  5CC 22FR (CATHETERS)
CATH FOLEY 2WAY SLVR 30CC 20FR (CATHETERS) ×4 IMPLANT
CATH FOLEY 2WAY SLVR 5CC 16FR (CATHETERS) IMPLANT
CATH FOLEY 2WAY SLVR 5CC 20FR (CATHETERS) IMPLANT
CATH FOLEY 2WAY SLVR 5CC 22FR (CATHETERS) IMPLANT
CATH HEMA 3WAY 30CC 24FR COUDE (CATHETERS) IMPLANT
CATH HEMA 3WAY 30CC 24FR RND (CATHETERS) IMPLANT
CATH URET 5FR 28IN OPEN ENDED (CATHETERS) IMPLANT
CLOTH BEACON ORANGE TIMEOUT ST (SAFETY) ×4 IMPLANT
ELECT HOOK LOOP BIPOLAR (NEEDLE) ×4 IMPLANT
ELECT LOOP MED HF 24F 12D (CUTTING LOOP) ×4 IMPLANT
ELECT REM PT RETURN 9FT ADLT (ELECTROSURGICAL)
ELECTRODE REM PT RTRN 9FT ADLT (ELECTROSURGICAL) IMPLANT
GLOVE BIO SURGEON STRL SZ 6.5 (GLOVE) ×3 IMPLANT
GLOVE BIO SURGEONS STRL SZ 6.5 (GLOVE) ×1
GLOVE BIOGEL PI IND STRL 7.0 (GLOVE) ×4 IMPLANT
GLOVE BIOGEL PI INDICATOR 7.0 (GLOVE) ×4
GLOVE ECLIPSE 7.0 STRL STRAW (GLOVE) ×4 IMPLANT
GLOVE SURG SS PI 8.0 STRL IVOR (GLOVE) ×4 IMPLANT
GOWN STRL REUS W/ TWL LRG LVL3 (GOWN DISPOSABLE) IMPLANT
GOWN STRL REUS W/ TWL XL LVL3 (GOWN DISPOSABLE) IMPLANT
GOWN STRL REUS W/TWL LRG LVL3 (GOWN DISPOSABLE)
GOWN STRL REUS W/TWL XL LVL3 (GOWN DISPOSABLE) ×12 IMPLANT
GUIDEWIRE STR DUAL SENSOR (WIRE) IMPLANT
HOLDER FOLEY CATH W/STRAP (MISCELLANEOUS) ×4 IMPLANT
IV NS IRRIG 3000ML ARTHROMATIC (IV SOLUTION) ×12 IMPLANT
KIT RM TURNOVER CYSTO AR (KITS) ×4 IMPLANT
LOOP CUT BIPOLAR 24F LRG (ELECTROSURGICAL) ×4 IMPLANT
MANIFOLD NEPTUNE II (INSTRUMENTS) ×4 IMPLANT
NDL SAFETY ECLIPSE 18X1.5 (NEEDLE) IMPLANT
NEEDLE HYPO 18GX1.5 SHARP (NEEDLE)
NEEDLE HYPO 22GX1.5 SAFETY (NEEDLE) IMPLANT
NS IRRIG 500ML POUR BTL (IV SOLUTION) IMPLANT
PACK CYSTO (CUSTOM PROCEDURE TRAY) ×4 IMPLANT
PLUG CATH AND CAP STER (CATHETERS) IMPLANT
SET ASPIRATION TUBING (TUBING) IMPLANT
SYR 20CC LL (SYRINGE) IMPLANT
SYR 30ML LL (SYRINGE) ×4 IMPLANT
SYR BULB IRRIGATION 50ML (SYRINGE) IMPLANT
TUBE CONNECTING 12'X1/4 (SUCTIONS)
TUBE CONNECTING 12X1/4 (SUCTIONS) IMPLANT
TUBING TUR DISP (UROLOGICAL SUPPLIES) ×4 IMPLANT
WATER STERILE IRR 3000ML UROMA (IV SOLUTION) IMPLANT
WATER STERILE IRR 500ML POUR (IV SOLUTION) ×4 IMPLANT

## 2017-05-04 NOTE — Discharge Instructions (Signed)
°Post Anesthesia Home Care Instructions ° °Activity: °Get plenty of rest for the remainder of the day. A responsible individual must stay with you for 24 hours following the procedure.  °For the next 24 hours, DO NOT: °-Drive a car °-Operate machinery °-Drink alcoholic beverages °-Take any medication unless instructed by your physician °-Make any legal decisions or sign important papers. ° °Meals: °Start with liquid foods such as gelatin or soup. Progress to regular foods as tolerated. Avoid greasy, spicy, heavy foods. If nausea and/or vomiting occur, drink only clear liquids until the nausea and/or vomiting subsides. Call your physician if vomiting continues. ° °Special Instructions/Symptoms: °Your throat may feel dry or sore from the anesthesia or the breathing tube placed in your throat during surgery. If this causes discomfort, gargle with warm salt water. The discomfort should disappear within 24 hours. ° °If you had a scopolamine patch placed behind your ear for the management of post- operative nausea and/or vomiting: ° °1. The medication in the patch is effective for 72 hours, after which it should be removed.  Wrap patch in a tissue and discard in the trash. Wash hands thoroughly with soap and water. °2. You may remove the patch earlier than 72 hours if you experience unpleasant side effects which may include dry mouth, dizziness or visual disturbances. °3. Avoid touching the patch. Wash your hands with soap and water after contact with the patch. °  °Indwelling Urinary Catheter Care, Adult °Take good care of your catheter to keep it working and to prevent problems. °How to wear your catheter °Attach your catheter to your leg with tape (adhesive tape) or a leg strap. Make sure it is not too tight. If you use tape, remove any bits of tape that are already on the catheter. °How to wear a drainage bag °You should have: °· A large overnight bag. °· A small leg bag. ° °Overnight Bag °You may wear the overnight  bag at any time. Always keep the bag below the level of your bladder but off the floor. When you sleep, put a clean plastic bag in a wastebasket. Then hang the bag inside the wastebasket. °Leg Bag °Never wear the leg bag at night. Always wear the leg bag below your knee. Keep the leg bag secure with a leg strap or tape. °How to care for your skin °· Clean the skin around the catheter at least once every day. °· Shower every day. Do not take baths. °· Put creams, lotions, or ointments on your genital area only as told by your doctor. °· Do not use powders, sprays, or lotions on your genital area. °How to clean your catheter and your skin °1. Wash your hands with soap and water. °2. Wet a washcloth in warm water and gentle (mild) soap. °3. Use the washcloth to clean the skin where the catheter enters your body. Clean downward and wipe away from the catheter in small circles. Do not wipe toward the catheter. °4. Pat the area dry with a clean towel. Make sure to clean off all soap. °How to care for your drainage bags °Empty your drainage bag when it is ?-½ full or at least 2-3 times a day. Replace your drainage bag once a month or sooner if it starts to smell bad or look dirty. Do not clean your drainage bag unless told by your doctor. °Emptying a drainage bag ° °Supplies Needed °· Rubbing alcohol. °· Gauze pad or cotton ball. °· Tape or a leg strap. ° °Steps °  1. Wash your hands with soap and water. °2. Separate (detach) the bag from your leg. °3. Hold the bag over the toilet or a clean container. Keep the bag below your hips and bladder. This stops pee (urine) from going back into the tube. °4. Open the pour spout at the bottom of the bag. °5. Empty the pee into the toilet or container. Do not let the pour spout touch any surface. °6. Put rubbing alcohol on a gauze pad or cotton ball. °7. Use the gauze pad or cotton ball to clean the pour spout. °8. Close the pour spout. °9. Attach the bag to your leg with tape or a  leg strap. °10. Wash your hands. ° °Changing a drainage bag °Supplies Needed °· Alcohol wipes. °· A clean drainage bag. °· Adhesive tape or a leg strap. ° °Steps °1. Wash your hands with soap and water. °2. Separate the dirty bag from your leg. °3. Pinch the rubber catheter with your fingers so that pee does not spill out. °4. Separate the catheter tube from the drainage tube where these tubes connect (at the connection valve). Do not let the tubes touch any surface. °5. Clean the end of the catheter tube with an alcohol wipe. Use a different alcohol wipe to clean the end of the drainage tube. °6. Connect the catheter tube to the drainage tube of the clean bag. °7. Attach the new bag to the leg with adhesive tape or a leg strap. °8. Wash your hands. ° °How to prevent infection and other problems °· Never pull on your catheter or try to remove it. Pulling can damage tissue in your body. °· Always wash your hands before and after touching your catheter. °· If a leg strap gets wet, replace it with a dry one. °· Drink enough fluids to keep your pee clear or pale yellow, or as told by your doctor. °· Do not let the drainage bag or tubing touch the floor. °· Wear cotton underwear. °· If you are male, wipe from front to back after you poop (have a bowel movement). °· Check on the catheter often to make sure it works and the tubing is not twisted. °Get help if: °· Your pee is cloudy. °· Your pee smells unusually bad. °· Your pee is not draining into the bag. °· Your tube gets clogged. °· Your catheter starts to leak. °· Your bladder feels full. °Get help right away if: °· You have redness, swelling, or pain where the catheter enters your body. °· You have fluid, pus, or a bad smell coming from the area where the catheter enters your body. °· The area where the catheter enters your body feels warm. °· You have a fever. °· You have pain in your: °? Stomach (abdomen). °? Legs. °? Lower back. °? Bladder. °· You see blood fill  the catheter. °· Your pee is pink or red. °· You feel sick to your stomach (nauseous). °· You throw up (vomit). °· You have chills. °· Your catheter gets pulled out. °This information is not intended to replace advice given to you by your health care provider. Make sure you discuss any questions you have with your health care provider. °Document Released: 01/28/2013 Document Revised: 08/31/2016 Document Reviewed: 03/18/2014 °Elsevier Interactive Patient Education © 2018 [employer] ° °

## 2017-05-04 NOTE — Anesthesia Postprocedure Evaluation (Signed)
Anesthesia Post Note  Patient: Jared Martinez  Procedure(s) Performed: Procedure(s) (LRB): TRANSURETHRAL RESECTION  OF THE PROSTATE (N/A) CYSTOSCOPY, URETHRAL DILITATION (N/A)     Patient location during evaluation: PACU Anesthesia Type: General Level of consciousness: awake and alert and patient cooperative Pain management: pain level controlled Vital Signs Assessment: post-procedure vital signs reviewed and stable Respiratory status: spontaneous breathing and respiratory function stable Cardiovascular status: stable Anesthetic complications: no    Last Vitals:  Vitals:   05/04/17 1230 05/04/17 1245  BP: 133/69 119/65  Pulse: (!) 57 (!) 59  Resp: 11 13  Temp:      Last Pain:  Vitals:   05/04/17 1245  TempSrc:   PainSc: [place]

## 2017-05-04 NOTE — Anesthesia Procedure Notes (Signed)
Procedure Name: LMA Insertion Date/Time: 05/04/2017 11:07 AM Performed by: Bethena Roys T Pre-anesthesia Checklist: Patient identified, Emergency Drugs available, Suction available and Patient being monitored Patient Re-evaluated:Patient Re-evaluated prior to induction Oxygen Delivery Method: Circle system utilized Preoxygenation: Pre-oxygenation with 100% oxygen Induction Type: IV induction Ventilation: Mask ventilation without difficulty LMA: LMA inserted LMA Size: 5.0 Number of attempts: 1 Airway Equipment and Method: Bite block Placement Confirmation: positive ETCO2 Tube secured with: Tape Dental Injury: Teeth and Oropharynx as per pre-operative assessment

## 2017-05-04 NOTE — Op Note (Signed)
Operative Note  Date: 05/04/2017   Preoperative Diagnosis: Bladder outlet obstruction  Postoperative Diagnosis: same  Procedure(s) Performed:  1) Cystourethroscopy 2) Transurethral resection of the prostate  Teaching Surgeon: Jared Seal, MD  Resident Surgeon:  Jared Clark, MD  Anesthesia:  General via LMA  Fluids:  See anesthesia record   Estimated blood loss:  None  Cultures or specimen: Prostate   Complications: None  Drains: 20Fr 3-way foley catheter with 30cc sterile water in balloon   -----  Indications: Jared Martinez is a 76 y.o. male with a history of BPH and LUTS. He was noted to have high voiding pressures on urodynamics and ongoing urgency and nocturia. He was unable to tolerate medical therapy, and wished to pursue TURP. Patient presents today for cystourethroscopy and TURP. Risks and benefits of the procedure were discussed in detail, and informed consent was obtained accordingly.   Findings: Elongated prostaticurethra. Mild bilateral prostatic hypertrophy without obstruction. High riding bladder neck. TUIP performed, followed by TURP of middle lobe of the prostate.   Description:  The patient was correctly identified in the preop holding area where written informed consent as well potential risk and complication was reviewed. He was brought back to the operative suite where a preinduction timeout was performed. Once correct information was verified, general anesthesia was induced via LMA. He was then gently placed into dorsal lithotomy position with SCDs in place for VTE prophylaxis. He was prepped and draped in the usual sterile fashion and given appropriate preoperative antibiotics with cefazolin. A second timeout was then performed to verify patient name, side and site of the procedure.   Cystoscopy The patient's urethral meatus was noted to be narrow, and would not accomodate a 35Fr cystoscope. Sequentially using urethral sounds, the patients urethra was  dilated from 18Fr to 32Fr in order to accomodate both cystoscopes. A  35F rigid cystoscope was inserted per the urethra with copious lubrication and normal saline irrigation running.  This demonstrated a normal urethra with no evidence of injury and a normal-appearing bladder with no evidence of trauma. The urethra was noted to be friable, and passage of the cystoscope did cause small amount of bleeding.  Lateral ureteral orifice was identified with ease in the normal anatomic position.  There were no masses or lesions. Patient's prostate findings as noted above.   Transurethral resection of prostate  We switched to a 26 [place] resectoscope with Jared Martinez working element and bipolar Jared Martinez. Given the patient's high riding bladder neck but minimal prostatic obstruction of the urethra, we elected to perform transurethral incision of the prostate to start. Directing the [employer] in the 5 and 7 o'clock positions, we incised the bladder neck and prostate to a depth just superficial to the prostatic capsule using bipolar electrocautery.   This opened up the bladder neck significantly, and a patent channel was visible to the bladder when sitting at the verumontanum. We next proceeded to perform resection of the median paying careful attention to remain clear of his bilateral ureteral orifices, which were clearly identified. We resected to a depth of stroma just superficial to prostatic capsule, obtaining satisfactory hemostasis along the way using bipolar electrocautery.  We paid careful attention to limit our resection distally to within the verumontanum to avoid involving the extrinsic sphincter within our resection.  Prostatic tissue chips were evacuated using bladder filling and evacuation and sent for Pathology analysis.    Once we felt satisfied with our resection, we withdrew our scope to the verumontanum.  Here we were able to appreciate a satisfactory urine channel with direct  visualization of his posterior bladder wall.  We again ensured strict hemostasis throughout our resected areas with our irrigation turned off.  We rechecked bilateral ureteral orifices which were uninvolved with our resection. Leaving the patient's bladder full, we removed all instrumentation and inserted a 22 French 2 way Foley catheter with 30 mL water in the balloon and placed this to drainage. His urine was draining clear.    Patient was awoken from general anesthesia having tolerated the procedure well with no complications. They were transferred to PACU in stable condition.   Post Op Plan:   1. Discharge patient home when meets PACU criteria. 2. Patient will remove foley catheter at home tomorrow, will be instructed how to do so by nursing.   Attestation:  Dr. Jeffie Martinez was present for the entire and and scrubbed for the entirety of the procedure.

## 2017-05-04 NOTE — Transfer of Care (Signed)
Immediate Anesthesia Transfer of Care Note  Patient: Jared Martinez  Procedure(s) Performed: Procedure(s): TRANSURETHRAL RESECTION  OF THE PROSTATE (N/A) CYSTOSCOPY, URETHRAL DILITATION (N/A)  Patient Location: PACU  Anesthesia Type:General  Level of Consciousness: drowsy  Airway & Oxygen Therapy: Patient Spontanous Breathing and Patient connected to nasal cannula oxygen  Post-op Assessment: Report given to RN  Post vital signs: Reviewed and stable  Last Vitals: 135/76, 58, 13, 93%, 98.2 Vitals:   05/04/17 0922  BP: 140/78  Pulse: 63  Resp: 19  Temp: 37.2 C    Last Pain:  Vitals:   05/04/17 0956  TempSrc:   PainSc: 2       Patients Stated Pain Goal: 4 ([id number])  Complications: No apparent anesthesia complications

## 2017-05-04 NOTE — Interval H&P Note (Signed)
History and Physical Interval Note:  05/04/2017 10:24 AM  Jared Martinez  has presented today for surgery, with the diagnosis of BENIGN PROSTATIC HYPERPLASIA, BLADDER OUTLET OBSTRUCTION  The various methods of treatment have been discussed with the patient and family. After consideration of risks, benefits and other options for treatment, the patient has consented to  Procedure(s): TRANSURETHRAL RESECTION OF THE PROSTATE (TURP) (N/A) CYSTOSCOPY (N/A) as a surgical intervention .  The patient's history has been reviewed, patient examined, no change in status, stable for surgery.  I have reviewed the patient's chart and labs.  Questions were answered to the patient's satisfaction.     Hutchinson Isenberg J

## 2017-05-04 NOTE — Anesthesia Preprocedure Evaluation (Signed)
Anesthesia Evaluation  Patient identified by MRN, date of birth, ID band Patient awake    Reviewed: Allergy & Precautions, NPO status , Patient's Chart, lab work & pertinent test results  Airway Mallampati: II  TM Distance: >3 FB Neck ROM: Full    Dental no notable dental hx.    Pulmonary sleep apnea , former smoker,    Pulmonary exam normal breath sounds clear to auscultation       Cardiovascular hypertension, Normal cardiovascular exam Rhythm:Regular Rate:Normal     Neuro/Psych negative neurological ROS  negative psych ROS   GI/Hepatic negative GI ROS, (+) Cirrhosis       ,   Endo/Other  negative endocrine ROS  Renal/GU negative Renal ROS  negative genitourinary   Musculoskeletal negative musculoskeletal ROS (+)   Abdominal   Peds negative pediatric ROS (+)  Hematology negative hematology ROS (+)   Anesthesia Other Findings   Reproductive/Obstetrics negative OB ROS                             Anesthesia Physical Anesthesia Plan  ASA: III  Anesthesia Plan: General   Post-op Pain Management:    Induction: Intravenous  PONV Risk Score and Plan: 2 and Ondansetron, Treatment may vary due to age or medical condition and Propofol  Airway Management Planned: LMA  Additional Equipment:   Intra-op Plan:   Post-operative Plan: Extubation in OR  Informed Consent: I have reviewed the patients History and Physical, chart, labs and discussed the procedure including the risks, benefits and alternatives for the proposed anesthesia with the patient or authorized representative who has indicated his/her understanding and acceptance.   Dental advisory given  Plan Discussed with: CRNA and Surgeon  Anesthesia Plan Comments:         Anesthesia Quick Evaluation

## 2017-05-05 ENCOUNTER — Ambulatory Visit (INDEPENDENT_AMBULATORY_CARE_PROVIDER_SITE_OTHER): Payer: Medicare Other | Admitting: Urology

## 2017-05-05 ENCOUNTER — Encounter (HOSPITAL_BASED_OUTPATIENT_CLINIC_OR_DEPARTMENT_OTHER): Payer: Self-pay | Admitting: Urology

## 2017-05-05 DIAGNOSIS — N401 Enlarged prostate with lower urinary tract symptoms: Secondary | ICD-10-CM

## 2017-05-15 DIAGNOSIS — L820 Inflamed seborrheic keratosis: Secondary | ICD-10-CM | POA: Diagnosis not present

## 2017-05-15 DIAGNOSIS — B009 Herpesviral infection, unspecified: Secondary | ICD-10-CM | POA: Diagnosis not present

## 2017-05-15 DIAGNOSIS — L570 Actinic keratosis: Secondary | ICD-10-CM | POA: Diagnosis not present

## 2017-05-15 DIAGNOSIS — Z85828 Personal history of other malignant neoplasm of skin: Secondary | ICD-10-CM | POA: Diagnosis not present

## 2017-05-15 DIAGNOSIS — L905 Scar conditions and fibrosis of skin: Secondary | ICD-10-CM | POA: Diagnosis not present

## 2017-05-15 DIAGNOSIS — L720 Epidermal cyst: Secondary | ICD-10-CM | POA: Diagnosis not present

## 2017-05-15 DIAGNOSIS — L821 Other seborrheic keratosis: Secondary | ICD-10-CM | POA: Diagnosis not present

## 2017-05-22 DIAGNOSIS — R3915 Urgency of urination: Secondary | ICD-10-CM | POA: Diagnosis not present

## 2017-05-22 DIAGNOSIS — N401 Enlarged prostate with lower urinary tract symptoms: Secondary | ICD-10-CM | POA: Diagnosis not present

## 2017-05-22 DIAGNOSIS — R3912 Poor urinary stream: Secondary | ICD-10-CM | POA: Diagnosis not present

## 2017-06-23 DIAGNOSIS — Z299 Encounter for prophylactic measures, unspecified: Secondary | ICD-10-CM | POA: Diagnosis not present

## 2017-06-23 DIAGNOSIS — I739 Peripheral vascular disease, unspecified: Secondary | ICD-10-CM | POA: Diagnosis not present

## 2017-06-23 DIAGNOSIS — E1142 Type 2 diabetes mellitus with diabetic polyneuropathy: Secondary | ICD-10-CM | POA: Diagnosis not present

## 2017-06-23 DIAGNOSIS — K7460 Unspecified cirrhosis of liver: Secondary | ICD-10-CM | POA: Diagnosis not present

## 2017-06-23 DIAGNOSIS — N400 Enlarged prostate without lower urinary tract symptoms: Secondary | ICD-10-CM | POA: Diagnosis not present

## 2017-06-23 DIAGNOSIS — Z6839 Body mass index (BMI) 39.0-39.9, adult: Secondary | ICD-10-CM | POA: Diagnosis not present

## 2017-06-23 DIAGNOSIS — F329 Major depressive disorder, single episode, unspecified: Secondary | ICD-10-CM | POA: Diagnosis not present

## 2017-06-23 DIAGNOSIS — E669 Obesity, unspecified: Secondary | ICD-10-CM | POA: Diagnosis not present

## 2017-06-23 DIAGNOSIS — E1165 Type 2 diabetes mellitus with hyperglycemia: Secondary | ICD-10-CM | POA: Diagnosis not present

## 2017-06-23 DIAGNOSIS — I10 Essential (primary) hypertension: Secondary | ICD-10-CM | POA: Diagnosis not present

## 2017-06-23 DIAGNOSIS — E7800 Pure hypercholesterolemia, unspecified: Secondary | ICD-10-CM | POA: Diagnosis not present

## 2017-06-26 DIAGNOSIS — J069 Acute upper respiratory infection, unspecified: Secondary | ICD-10-CM | POA: Diagnosis not present

## 2017-07-03 DIAGNOSIS — J069 Acute upper respiratory infection, unspecified: Secondary | ICD-10-CM | POA: Diagnosis not present

## 2017-07-12 DIAGNOSIS — K219 Gastro-esophageal reflux disease without esophagitis: Secondary | ICD-10-CM | POA: Diagnosis not present

## 2017-07-12 DIAGNOSIS — I10 Essential (primary) hypertension: Secondary | ICD-10-CM | POA: Diagnosis not present

## 2017-07-12 DIAGNOSIS — K7460 Unspecified cirrhosis of liver: Secondary | ICD-10-CM | POA: Diagnosis not present

## 2017-07-12 DIAGNOSIS — R011 Cardiac murmur, unspecified: Secondary | ICD-10-CM | POA: Diagnosis not present

## 2017-07-12 DIAGNOSIS — E7800 Pure hypercholesterolemia, unspecified: Secondary | ICD-10-CM | POA: Diagnosis not present

## 2017-07-12 DIAGNOSIS — F329 Major depressive disorder, single episode, unspecified: Secondary | ICD-10-CM | POA: Diagnosis not present

## 2017-07-12 DIAGNOSIS — J069 Acute upper respiratory infection, unspecified: Secondary | ICD-10-CM | POA: Diagnosis not present

## 2017-07-12 DIAGNOSIS — Z6838 Body mass index (BMI) 38.0-38.9, adult: Secondary | ICD-10-CM | POA: Diagnosis not present

## 2017-07-17 DIAGNOSIS — R010 Benign and innocent cardiac murmurs: Secondary | ICD-10-CM | POA: Diagnosis not present

## 2017-07-26 DIAGNOSIS — M159 Polyosteoarthritis, unspecified: Secondary | ICD-10-CM | POA: Diagnosis not present

## 2017-07-26 DIAGNOSIS — I10 Essential (primary) hypertension: Secondary | ICD-10-CM | POA: Diagnosis not present

## 2017-07-26 DIAGNOSIS — E7800 Pure hypercholesterolemia, unspecified: Secondary | ICD-10-CM | POA: Diagnosis not present

## 2017-07-26 DIAGNOSIS — E119 Type 2 diabetes mellitus without complications: Secondary | ICD-10-CM | POA: Diagnosis not present

## 2017-08-16 DIAGNOSIS — Z23 Encounter for immunization: Secondary | ICD-10-CM | POA: Diagnosis not present

## 2017-08-21 DIAGNOSIS — I10 Essential (primary) hypertension: Secondary | ICD-10-CM | POA: Diagnosis not present

## 2017-08-21 DIAGNOSIS — E119 Type 2 diabetes mellitus without complications: Secondary | ICD-10-CM | POA: Diagnosis not present

## 2017-08-21 DIAGNOSIS — E7800 Pure hypercholesterolemia, unspecified: Secondary | ICD-10-CM | POA: Diagnosis not present

## 2017-08-21 DIAGNOSIS — M159 Polyosteoarthritis, unspecified: Secondary | ICD-10-CM | POA: Diagnosis not present

## 2017-09-04 DIAGNOSIS — N401 Enlarged prostate with lower urinary tract symptoms: Secondary | ICD-10-CM | POA: Diagnosis not present

## 2017-09-04 DIAGNOSIS — R351 Nocturia: Secondary | ICD-10-CM | POA: Diagnosis not present

## 2017-09-04 DIAGNOSIS — R3915 Urgency of urination: Secondary | ICD-10-CM | POA: Diagnosis not present

## 2017-09-13 DIAGNOSIS — Z6839 Body mass index (BMI) 39.0-39.9, adult: Secondary | ICD-10-CM | POA: Diagnosis not present

## 2017-09-13 DIAGNOSIS — I10 Essential (primary) hypertension: Secondary | ICD-10-CM | POA: Diagnosis not present

## 2017-09-13 DIAGNOSIS — Z299 Encounter for prophylactic measures, unspecified: Secondary | ICD-10-CM | POA: Diagnosis not present

## 2017-09-13 DIAGNOSIS — J069 Acute upper respiratory infection, unspecified: Secondary | ICD-10-CM | POA: Diagnosis not present

## 2017-09-13 DIAGNOSIS — Z87891 Personal history of nicotine dependence: Secondary | ICD-10-CM | POA: Diagnosis not present

## 2017-09-27 ENCOUNTER — Encounter: Payer: Self-pay | Admitting: *Deleted

## 2017-09-27 ENCOUNTER — Telehealth: Payer: Self-pay | Admitting: Gastroenterology

## 2017-09-27 NOTE — Telephone Encounter (Signed)
Letter mailed to pt.  

## 2017-09-27 NOTE — Telephone Encounter (Signed)
Recall for ultrasound 

## 2017-09-29 DIAGNOSIS — J029 Acute pharyngitis, unspecified: Secondary | ICD-10-CM | POA: Diagnosis not present

## 2017-09-29 DIAGNOSIS — E1142 Type 2 diabetes mellitus with diabetic polyneuropathy: Secondary | ICD-10-CM | POA: Diagnosis not present

## 2017-09-29 DIAGNOSIS — E1165 Type 2 diabetes mellitus with hyperglycemia: Secondary | ICD-10-CM | POA: Diagnosis not present

## 2017-09-29 DIAGNOSIS — I10 Essential (primary) hypertension: Secondary | ICD-10-CM | POA: Diagnosis not present

## 2017-09-29 DIAGNOSIS — Z299 Encounter for prophylactic measures, unspecified: Secondary | ICD-10-CM | POA: Diagnosis not present

## 2017-09-29 DIAGNOSIS — Z6838 Body mass index (BMI) 38.0-38.9, adult: Secondary | ICD-10-CM | POA: Diagnosis not present

## 2017-09-29 DIAGNOSIS — K7460 Unspecified cirrhosis of liver: Secondary | ICD-10-CM | POA: Diagnosis not present

## 2017-10-23 ENCOUNTER — Encounter: Payer: Self-pay | Admitting: Gastroenterology

## 2017-10-23 ENCOUNTER — Ambulatory Visit (INDEPENDENT_AMBULATORY_CARE_PROVIDER_SITE_OTHER): Payer: Medicare Other | Admitting: Gastroenterology

## 2017-10-23 VITALS — BP 121/74 | HR 62 | Temp 97.4°F | Ht 71.0 in | Wt 267.6 lb

## 2017-10-23 DIAGNOSIS — Z860100 Personal history of colon polyps, unspecified: Secondary | ICD-10-CM | POA: Insufficient documentation

## 2017-10-23 DIAGNOSIS — K7469 Other cirrhosis of liver: Secondary | ICD-10-CM | POA: Diagnosis not present

## 2017-10-23 NOTE — Patient Instructions (Addendum)
I will request any blood work from Dr. Manuella Ghazi.  We will proceed with a routine ultrasound, which we do every 6 months.  We can arrange a colonoscopy after you speak to Dr. Manuella Ghazi. It is due this year. I will see you in 6 months or sooner if you would like to arrange it before then!

## 2017-10-23 NOTE — Progress Notes (Signed)
Referring Provider: Monico Blitz, MD Primary Care Physician:  Monico Blitz, MD Primary GI: Dr. Oneida Alar   Chief Complaint  Patient presents with  . Cirrhosis    f/u.    HPI:   Jared Martinez is a 77 y.o. male presenting today with a history of likely NASH cirrhosis. Previously followed by Dr. Britta Mccreedy. Established care with [facility] in July 2018. Labs with negative Hep B surface antibody and core antibody, Hep C antibody negative. ANA negative. Ferritin 84. Hep A total antibody negative. Alpha-1 antitrypsin normal at 112. Hep B surface antigen negative. Completed Hep A and B vaccination in the past. Last colonoscopy in 2016 by Dr. Britta Mccreedy with multiple sessile polyps (tubular adenomas), diverticulosis in left colon, small angiodysplastic lesion in transverse and ascending colon. Last EGD in Sept 2017 with 3 columns of medium-sized varices without stigmata of bleeding. Otherwise normal. On Nadolol for bleeding prophylaxis.   Due for surveillance colonoscopy if health permits. MELD 12 in July 2018. Needs routine [place] now. Occasional LLQ/groin discomfort. No precipitating factors. No rectal bleeding. Has alternating constipation/diarrhea. Will take Imodium as needed. No confusion, mental status changes. Retired Dec 2018. Lasix 40 mg daily. Nadolol 40 mg daily.   Past Medical History:  Diagnosis Date  . Anxiety   . Arthritis   . Bilateral edema of lower extremity    right > left --- per pt cellulitis right lower leg 2016 , has had swelling since  . Bladder outlet obstruction   . BPH (benign prostatic hyperplasia)   . Depression   . GERD (gastroesophageal reflux disease)   . Hepatic cirrhosis, unspecified hepatic cirrhosis type ([place]) dx 2016   per pt non-alcoholic fatty liver---  pt established care w/ dr fields [facility])--  04-26-2017  liver ultrasound --  cirrohoisis ,  no asites  . History of adenomatous polyp of colon    2016  tubular adenoma's  (per dr fields note)  .  Hyperlipidemia   . Hypertension   . Lower urinary tract symptoms (LUTS)   . OSA (obstructive sleep apnea)    per pt has CPAP but has not used since Jan 2018-  hx sleep apnea surgery's  . Pre-diabetes     Past Surgical History:  Procedure Laterality Date  . APPENDECTOMY  teen  . CATARACT EXTRACTION W/ INTRAOCULAR LENS  IMPLANT, BILATERAL  2017  . COLONOSCOPY  2016   multiple sessile polyps (tubular adenomas), diverticulosis in left colon, small angiodysplastic lesion in transverse and ascending colon.  . CYSTOSCOPY N/A 05/04/2017   Procedure: Evie Lacks DILITATION;  Surgeon: Irine Seal, MD;  Location: [facility];  Service: Urology;  Laterality: N/A;  . ESOPHAGOGASTRODUODENOSCOPY  06/2016   3 columns of medium-sized varices without stigmata of bleeding. Otherwise normal.  . NASAL SEPTUM SURGERY  x2  last one yrs ago  . PILONIDAL CYST EXCISION  x3  last one 1970's  . SHOULDER ARTHROSCOPY Right 2010 approx.  Marland Kitchen TOTAL KNEE ARTHROPLASTY  01/02/2012   Procedure: TOTAL KNEE ARTHROPLASTY;  Surgeon: Lorn Junes, MD;  Location: [facility];  Service: Orthopedics;  Laterality: Right;  . TOTAL KNEE ARTHROPLASTY Left [place] , [place] RESECTION OF PROSTATE N/A 05/04/2017   Procedure: TRANSURETHRAL RESECTION  OF THE PROSTATE;  Surgeon: Irine Seal, MD;  [facility];  Service: Urology;  Laterality: N/A;  . UVULOPALATOPHARYNGOPLASTY  ?unknown date   w/  bilateral Tonsillectomy    Current Outpatient Medications  Medication Sig Dispense Refill  . aspirin EC 81 MG tablet Take 1 tablet (81 mg total) by mouth daily.    . cetirizine (ZYRTEC) 10 MG tablet Take 10 mg by mouth every morning.     . citalopram (CELEXA) 20 MG tablet Take 20 mg by mouth every evening.    . cycloSPORINE (RESTASIS) 0.05 % ophthalmic emulsion Place 1 drop into both eyes as needed.     . diazepam (VALIUM) 5 MG tablet Take 5 mg by mouth every 6 (six) hours as needed.     . finasteride (PROSCAR) 5 MG tablet Take 5 mg by mouth every morning.     . furosemide (LASIX) 40 MG tablet Take 40 mg by mouth every evening.     Marland Kitchen lisinopril (PRINIVIL,ZESTRIL) 20 MG tablet Take 20 mg by mouth every morning.     . metFORMIN (GLUCOPHAGE) 500 MG tablet Take 500 mg by mouth daily with breakfast.    . mirtazapine (REMERON) 15 MG tablet Take 15 mg by mouth at bedtime.    . nadolol (CORGARD) 40 MG tablet Take 1 tablet (40 mg total) by mouth every evening. 30 tablet 5   No current facility-administered medications for this visit.     Allergies as of 10/23/2017 - Review Complete 10/23/2017  Allergen Reaction Noted  . Flomax [tamsulosin] Other (See Comments) 04/27/2017  . Oxybutynin Nausea Only 04/27/2017  . Adhesive [tape] Rash 12/19/2011    Family History  Problem Relation Age of Onset  . Alzheimer's disease Mother   . Liver cancer Father   . Arthritis Sister   . Arthritis Brother   . Colon cancer Neg Hx   . Colon polyps Neg Hx     Social History   Socioeconomic History  . Marital status: Divorced    Spouse name: None  . Number of children: None  . Years of education: None  . Highest education level: None  Social Needs  . Financial resource strain: None  . Food insecurity - worry: None  . Food insecurity - inability: None  . Transportation needs - medical: None  . Transportation needs - non-medical: None  Occupational History  . 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 Smoking status: Former Smoker    Years: 25.00    Types: Cigarettes    Last attempt to quit: 12/26/1992    Years since quitting: 24.8  . Smokeless tobacco: Former Systems developer    Types: Snuff    Quit date: 04/27/1993  . Tobacco comment: hx light smoker  Substance and Sexual Activity  . Alcohol use: No  . Drug use: No  . Sexual activity: None  Other Topics Concern  . None  Social History Narrative  . None    Review of Systems: Gen: Denies  fever, chills, anorexia. Denies fatigue, weakness, weight loss.  CV: Denies chest pain, palpitations, syncope, peripheral edema, and claudication. Resp: Denies dyspnea at rest, cough, wheezing, coughing up blood, and pleurisy. GI: see HPI  Derm: Denies rash, itching, dry skin Psych: Denies depression, anxiety, memory loss, confusion. No homicidal or suicidal ideation.  Heme: Denies bruising, bleeding, and enlarged lymph nodes.  Physical Exam: BP 121/74   Pulse 62   Temp (!) 97.4 F (36.3 C) (Oral)   Ht 5\' 11"  (1.803 m)   Wt 267 lb 9.6 oz (121.4 kg)   BMI 37.32 kg/m  General:   Alert and oriented. No distress noted. Pleasant and  cooperative.  Head:  Normocephalic and atraumatic. Eyes:  Conjuctiva clear without scleral icterus. Mouth:  Oral mucosa pink and moist.  Abdomen:  +BS, soft, non-tender and non-distended. Obese. Moderate sized umbilical hernia. No rebound or guarding.  Msk:  Symmetrical without gross deformities. Normal posture. Extremities:  Trace lower extremity edema  Neurologic:  Alert and  oriented x4 Psych:  Alert and cooperative. Normal mood and affect.

## 2017-10-25 ENCOUNTER — Encounter: Payer: Self-pay | Admitting: Gastroenterology

## 2017-10-25 NOTE — Assessment & Plan Note (Signed)
77 year old male with history of likely NASH cirrhosis, due for ultrasound now. Well-compensated. On Nadolol for bleeding prophylaxis. EGD on file from Sept 2017. Arrange [place] now and retrieve any labs from Dr. Manuella Ghazi. Return in 6 months.

## 2017-10-25 NOTE — Assessment & Plan Note (Signed)
Multiple polyps in 2016 by Dr. Britta Mccreedy. Due for surveillance now sometime this year. He desires to discuss first with Dr. Manuella Ghazi. He will contact us if he would like to proceed sooner, otherwise we will see him in 6 months.

## 2017-10-26 ENCOUNTER — Other Ambulatory Visit: Payer: Self-pay | Admitting: Nurse Practitioner

## 2017-10-26 NOTE — Progress Notes (Signed)
CC'D TO PCP °

## 2017-10-27 ENCOUNTER — Ambulatory Visit (HOSPITAL_COMMUNITY)
Admission: RE | Admit: 2017-10-27 | Discharge: 2017-10-27 | Disposition: A | Discharge status: Home / Self Care | Payer: Medicare Other | Source: Ambulatory Visit | Attending: Gastroenterology | Admitting: Gastroenterology

## 2017-10-27 DIAGNOSIS — K7460 Unspecified cirrhosis of liver: Secondary | ICD-10-CM | POA: Diagnosis not present

## 2017-10-27 DIAGNOSIS — K7469 Other cirrhosis of liver: Secondary | ICD-10-CM

## 2017-10-31 DIAGNOSIS — Z789 Other specified health status: Secondary | ICD-10-CM | POA: Diagnosis not present

## 2017-10-31 DIAGNOSIS — Z6838 Body mass index (BMI) 38.0-38.9, adult: Secondary | ICD-10-CM | POA: Diagnosis not present

## 2017-10-31 DIAGNOSIS — E1142 Type 2 diabetes mellitus with diabetic polyneuropathy: Secondary | ICD-10-CM | POA: Diagnosis not present

## 2017-10-31 DIAGNOSIS — E1165 Type 2 diabetes mellitus with hyperglycemia: Secondary | ICD-10-CM | POA: Diagnosis not present

## 2017-10-31 DIAGNOSIS — K7460 Unspecified cirrhosis of liver: Secondary | ICD-10-CM | POA: Diagnosis not present

## 2017-10-31 DIAGNOSIS — Z299 Encounter for prophylactic measures, unspecified: Secondary | ICD-10-CM | POA: Diagnosis not present

## 2017-10-31 DIAGNOSIS — H6120 Impacted cerumen, unspecified ear: Secondary | ICD-10-CM | POA: Diagnosis not present

## 2017-11-01 ENCOUNTER — Other Ambulatory Visit: Payer: Self-pay

## 2017-11-01 DIAGNOSIS — K7460 Unspecified cirrhosis of liver: Secondary | ICD-10-CM

## 2017-11-01 NOTE — Progress Notes (Signed)
Known cirrhosis on US abdomen. The most recent labs he had with Dr. Manuella Ghazi were dated May 2018. We need an updated CBC, CMP, INR to have on file.

## 2017-11-02 NOTE — Progress Notes (Signed)
LMOM to call.

## 2017-11-02 NOTE — Progress Notes (Signed)
Tried to call pt. Line busy. Lab orders have been entered and released.

## 2017-11-03 NOTE — Progress Notes (Signed)
Letter mailed to pt with lab orders.

## 2017-11-07 DIAGNOSIS — Z87891 Personal history of nicotine dependence: Secondary | ICD-10-CM | POA: Diagnosis not present

## 2017-11-07 DIAGNOSIS — J069 Acute upper respiratory infection, unspecified: Secondary | ICD-10-CM | POA: Diagnosis not present

## 2017-11-07 DIAGNOSIS — Z299 Encounter for prophylactic measures, unspecified: Secondary | ICD-10-CM | POA: Diagnosis not present

## 2017-11-07 DIAGNOSIS — Z6838 Body mass index (BMI) 38.0-38.9, adult: Secondary | ICD-10-CM | POA: Diagnosis not present

## 2017-11-07 DIAGNOSIS — I10 Essential (primary) hypertension: Secondary | ICD-10-CM | POA: Diagnosis not present

## 2017-11-13 DIAGNOSIS — L570 Actinic keratosis: Secondary | ICD-10-CM | POA: Diagnosis not present

## 2017-11-13 DIAGNOSIS — L820 Inflamed seborrheic keratosis: Secondary | ICD-10-CM | POA: Diagnosis not present

## 2017-11-13 DIAGNOSIS — L821 Other seborrheic keratosis: Secondary | ICD-10-CM | POA: Diagnosis not present

## 2017-11-13 DIAGNOSIS — B009 Herpesviral infection, unspecified: Secondary | ICD-10-CM | POA: Diagnosis not present

## 2017-11-13 DIAGNOSIS — Z85828 Personal history of other malignant neoplasm of skin: Secondary | ICD-10-CM | POA: Diagnosis not present

## 2017-11-13 DIAGNOSIS — D485 Neoplasm of uncertain behavior of skin: Secondary | ICD-10-CM | POA: Diagnosis not present

## 2017-11-13 DIAGNOSIS — K7460 Unspecified cirrhosis of liver: Secondary | ICD-10-CM | POA: Diagnosis not present

## 2017-11-13 DIAGNOSIS — L905 Scar conditions and fibrosis of skin: Secondary | ICD-10-CM | POA: Diagnosis not present

## 2017-11-13 DIAGNOSIS — C44722 Squamous cell carcinoma of skin of right lower limb, including hip: Secondary | ICD-10-CM | POA: Diagnosis not present

## 2017-11-14 LAB — COMPREHENSIVE METABOLIC PANEL
AG Ratio: 1.1 (calc) (ref 1.0–2.5)
ALT: 33 U/L (ref 9–46)
AST: 38 U/L — ABNORMAL HIGH (ref 10–35)
Albumin: 3.6 g/dL (ref 3.6–5.1)
Alkaline phosphatase (APISO): 72 U/L (ref 40–115)
BUN: 20 mg/dL (ref 7–25)
CO2: 28 mmol/L (ref 20–32)
Calcium: 9.7 mg/dL (ref 8.6–10.3)
Chloride: 103 mmol/L (ref 98–110)
Creat: 1.07 mg/dL (ref 0.70–1.18)
Globulin: 3.4 g/dL (ref 1.9–3.7)
Glucose, Bld: 135 mg/dL — ABNORMAL HIGH (ref 65–99)
Potassium: 3.8 mmol/L (ref 3.5–5.3)
Sodium: 141 mmol/L (ref 135–146)
Total Bilirubin: 1.3 mg/dL — ABNORMAL HIGH (ref 0.2–1.2)
Total Protein: 7 g/dL (ref 6.1–8.1)

## 2017-11-14 LAB — CBC WITH DIFFERENTIAL/PLATELET
Basophils Absolute: 41 {cells}/uL (ref 0–200)
Basophils Relative: 0.8 %
Eosinophils Absolute: 107 {cells}/uL (ref 15–500)
Eosinophils Relative: 2.1 %
HCT: 44.6 % (ref 38.5–50.0)
Hemoglobin: 15.7 g/dL (ref 13.2–17.1)
Lymphs Abs: 1974 {cells}/uL (ref 850–3900)
MCH: 33.5 pg — ABNORMAL HIGH (ref 27.0–33.0)
MCHC: 35.2 g/dL (ref 32.0–36.0)
MCV: 95.1 fL (ref 80.0–100.0)
MPV: 11 fL (ref 7.5–12.5)
Monocytes Relative: 9.6 %
Neutro Abs: 2489 {cells}/uL (ref 1500–7800)
Neutrophils Relative %: 48.8 %
Platelets: 115 10*3/uL — ABNORMAL LOW (ref 140–400)
RBC: 4.69 10*6/uL (ref 4.20–5.80)
RDW: 13.1 % (ref 11.0–15.0)
Total Lymphocyte: 38.7 %
WBC mixed population: 490 {cells}/uL (ref 200–950)
WBC: 5.1 10*3/uL (ref 3.8–10.8)

## 2017-11-14 LAB — PROTIME-INR
INR: 1.1
Prothrombin Time: 11.9 s — ABNORMAL HIGH (ref 9.0–11.5)

## 2017-11-16 NOTE — Progress Notes (Signed)
Sent in [place]: Mr. Knipple: your Hgb is good. INR is stable. Liver numbers are stable. We will see you in 6 months!   (MELD Na: 9).

## 2017-11-16 NOTE — Progress Notes (Signed)
Noted  

## 2017-11-20 DIAGNOSIS — C44722 Squamous cell carcinoma of skin of right lower limb, including hip: Secondary | ICD-10-CM | POA: Diagnosis not present

## 2017-11-20 DIAGNOSIS — L905 Scar conditions and fibrosis of skin: Secondary | ICD-10-CM | POA: Diagnosis not present

## 2017-11-20 DIAGNOSIS — L570 Actinic keratosis: Secondary | ICD-10-CM | POA: Diagnosis not present

## 2017-11-21 DIAGNOSIS — Z23 Encounter for immunization: Secondary | ICD-10-CM | POA: Diagnosis not present

## 2017-12-04 DIAGNOSIS — N401 Enlarged prostate with lower urinary tract symptoms: Secondary | ICD-10-CM | POA: Diagnosis not present

## 2017-12-04 DIAGNOSIS — N3941 Urge incontinence: Secondary | ICD-10-CM | POA: Diagnosis not present

## 2017-12-04 DIAGNOSIS — R3915 Urgency of urination: Secondary | ICD-10-CM | POA: Diagnosis not present

## 2017-12-13 DIAGNOSIS — I10 Essential (primary) hypertension: Secondary | ICD-10-CM | POA: Diagnosis not present

## 2017-12-13 DIAGNOSIS — E7800 Pure hypercholesterolemia, unspecified: Secondary | ICD-10-CM | POA: Diagnosis not present

## 2017-12-13 DIAGNOSIS — E119 Type 2 diabetes mellitus without complications: Secondary | ICD-10-CM | POA: Diagnosis not present

## 2017-12-13 DIAGNOSIS — M159 Polyosteoarthritis, unspecified: Secondary | ICD-10-CM | POA: Diagnosis not present

## 2017-12-15 DIAGNOSIS — Z299 Encounter for prophylactic measures, unspecified: Secondary | ICD-10-CM | POA: Diagnosis not present

## 2017-12-15 DIAGNOSIS — L03115 Cellulitis of right lower limb: Secondary | ICD-10-CM | POA: Diagnosis not present

## 2017-12-15 DIAGNOSIS — I10 Essential (primary) hypertension: Secondary | ICD-10-CM | POA: Diagnosis not present

## 2017-12-15 DIAGNOSIS — E1165 Type 2 diabetes mellitus with hyperglycemia: Secondary | ICD-10-CM | POA: Diagnosis not present

## 2017-12-15 DIAGNOSIS — K429 Umbilical hernia without obstruction or gangrene: Secondary | ICD-10-CM | POA: Diagnosis not present

## 2017-12-15 DIAGNOSIS — G8929 Other chronic pain: Secondary | ICD-10-CM | POA: Diagnosis not present

## 2017-12-15 DIAGNOSIS — N400 Enlarged prostate without lower urinary tract symptoms: Secondary | ICD-10-CM | POA: Diagnosis not present

## 2017-12-15 DIAGNOSIS — F329 Major depressive disorder, single episode, unspecified: Secondary | ICD-10-CM | POA: Diagnosis not present

## 2017-12-15 DIAGNOSIS — M25559 Pain in unspecified hip: Secondary | ICD-10-CM | POA: Diagnosis not present

## 2017-12-15 DIAGNOSIS — Z87891 Personal history of nicotine dependence: Secondary | ICD-10-CM | POA: Diagnosis not present

## 2017-12-15 DIAGNOSIS — Z6838 Body mass index (BMI) 38.0-38.9, adult: Secondary | ICD-10-CM | POA: Diagnosis not present

## 2018-01-02 DIAGNOSIS — M159 Polyosteoarthritis, unspecified: Secondary | ICD-10-CM | POA: Diagnosis not present

## 2018-01-02 DIAGNOSIS — E119 Type 2 diabetes mellitus without complications: Secondary | ICD-10-CM | POA: Diagnosis not present

## 2018-01-02 DIAGNOSIS — E7800 Pure hypercholesterolemia, unspecified: Secondary | ICD-10-CM | POA: Diagnosis not present

## 2018-01-02 DIAGNOSIS — I10 Essential (primary) hypertension: Secondary | ICD-10-CM | POA: Diagnosis not present

## 2018-01-03 DIAGNOSIS — Z299 Encounter for prophylactic measures, unspecified: Secondary | ICD-10-CM | POA: Diagnosis not present

## 2018-01-03 DIAGNOSIS — K7460 Unspecified cirrhosis of liver: Secondary | ICD-10-CM | POA: Diagnosis not present

## 2018-01-03 DIAGNOSIS — I10 Essential (primary) hypertension: Secondary | ICD-10-CM | POA: Diagnosis not present

## 2018-01-03 DIAGNOSIS — E1165 Type 2 diabetes mellitus with hyperglycemia: Secondary | ICD-10-CM | POA: Diagnosis not present

## 2018-01-03 DIAGNOSIS — I739 Peripheral vascular disease, unspecified: Secondary | ICD-10-CM | POA: Diagnosis not present

## 2018-01-03 DIAGNOSIS — E1142 Type 2 diabetes mellitus with diabetic polyneuropathy: Secondary | ICD-10-CM | POA: Diagnosis not present

## 2018-01-03 DIAGNOSIS — Z6838 Body mass index (BMI) 38.0-38.9, adult: Secondary | ICD-10-CM | POA: Diagnosis not present

## 2018-01-11 DIAGNOSIS — L821 Other seborrheic keratosis: Secondary | ICD-10-CM | POA: Diagnosis not present

## 2018-01-11 DIAGNOSIS — Z85828 Personal history of other malignant neoplasm of skin: Secondary | ICD-10-CM | POA: Diagnosis not present

## 2018-01-11 DIAGNOSIS — L905 Scar conditions and fibrosis of skin: Secondary | ICD-10-CM | POA: Diagnosis not present

## 2018-01-11 DIAGNOSIS — L570 Actinic keratosis: Secondary | ICD-10-CM | POA: Diagnosis not present

## 2018-01-11 DIAGNOSIS — C44622 Squamous cell carcinoma of skin of right upper limb, including shoulder: Secondary | ICD-10-CM | POA: Diagnosis not present

## 2018-01-15 DIAGNOSIS — R3915 Urgency of urination: Secondary | ICD-10-CM | POA: Diagnosis not present

## 2018-01-15 DIAGNOSIS — R351 Nocturia: Secondary | ICD-10-CM | POA: Diagnosis not present

## 2018-01-15 DIAGNOSIS — N3941 Urge incontinence: Secondary | ICD-10-CM | POA: Diagnosis not present

## 2018-01-17 DIAGNOSIS — H04123 Dry eye syndrome of bilateral lacrimal glands: Secondary | ICD-10-CM | POA: Diagnosis not present

## 2018-01-17 DIAGNOSIS — H524 Presbyopia: Secondary | ICD-10-CM | POA: Diagnosis not present

## 2018-01-17 DIAGNOSIS — H40013 Open angle with borderline findings, low risk, bilateral: Secondary | ICD-10-CM | POA: Diagnosis not present

## 2018-01-17 DIAGNOSIS — E119 Type 2 diabetes mellitus without complications: Secondary | ICD-10-CM | POA: Diagnosis not present

## 2018-01-17 DIAGNOSIS — H353131 Nonexudative age-related macular degeneration, bilateral, early dry stage: Secondary | ICD-10-CM | POA: Diagnosis not present

## 2018-01-25 DIAGNOSIS — L821 Other seborrheic keratosis: Secondary | ICD-10-CM | POA: Diagnosis not present

## 2018-01-25 DIAGNOSIS — L905 Scar conditions and fibrosis of skin: Secondary | ICD-10-CM | POA: Diagnosis not present

## 2018-01-25 DIAGNOSIS — L820 Inflamed seborrheic keratosis: Secondary | ICD-10-CM | POA: Diagnosis not present

## 2018-01-25 DIAGNOSIS — L570 Actinic keratosis: Secondary | ICD-10-CM | POA: Diagnosis not present

## 2018-01-25 DIAGNOSIS — Z85828 Personal history of other malignant neoplasm of skin: Secondary | ICD-10-CM | POA: Diagnosis not present

## 2018-02-06 DIAGNOSIS — T8482XD Fibrosis due to internal orthopedic prosthetic devices, implants and grafts, subsequent encounter: Secondary | ICD-10-CM | POA: Diagnosis not present

## 2018-02-06 DIAGNOSIS — M25561 Pain in right knee: Secondary | ICD-10-CM | POA: Diagnosis not present

## 2018-03-05 DIAGNOSIS — Z85828 Personal history of other malignant neoplasm of skin: Secondary | ICD-10-CM | POA: Diagnosis not present

## 2018-03-05 DIAGNOSIS — L821 Other seborrheic keratosis: Secondary | ICD-10-CM | POA: Diagnosis not present

## 2018-03-05 DIAGNOSIS — L720 Epidermal cyst: Secondary | ICD-10-CM | POA: Diagnosis not present

## 2018-03-05 DIAGNOSIS — C44622 Squamous cell carcinoma of skin of right upper limb, including shoulder: Secondary | ICD-10-CM | POA: Diagnosis not present

## 2018-03-05 DIAGNOSIS — D485 Neoplasm of uncertain behavior of skin: Secondary | ICD-10-CM | POA: Diagnosis not present

## 2018-03-05 DIAGNOSIS — L570 Actinic keratosis: Secondary | ICD-10-CM | POA: Diagnosis not present

## 2018-03-05 DIAGNOSIS — L820 Inflamed seborrheic keratosis: Secondary | ICD-10-CM | POA: Diagnosis not present

## 2018-03-05 DIAGNOSIS — L905 Scar conditions and fibrosis of skin: Secondary | ICD-10-CM | POA: Diagnosis not present

## 2018-03-05 DIAGNOSIS — B078 Other viral warts: Secondary | ICD-10-CM | POA: Diagnosis not present

## 2018-03-16 DIAGNOSIS — Z7189 Other specified counseling: Secondary | ICD-10-CM | POA: Diagnosis not present

## 2018-03-16 DIAGNOSIS — Z0000 Encounter for general adult medical examination without abnormal findings: Secondary | ICD-10-CM | POA: Diagnosis not present

## 2018-03-16 DIAGNOSIS — Z1339 Encounter for screening examination for other mental health and behavioral disorders: Secondary | ICD-10-CM | POA: Diagnosis not present

## 2018-03-16 DIAGNOSIS — Z1211 Encounter for screening for malignant neoplasm of colon: Secondary | ICD-10-CM | POA: Diagnosis not present

## 2018-03-16 DIAGNOSIS — E1165 Type 2 diabetes mellitus with hyperglycemia: Secondary | ICD-10-CM | POA: Diagnosis not present

## 2018-03-16 DIAGNOSIS — K7460 Unspecified cirrhosis of liver: Secondary | ICD-10-CM | POA: Diagnosis not present

## 2018-03-16 DIAGNOSIS — Z125 Encounter for screening for malignant neoplasm of prostate: Secondary | ICD-10-CM | POA: Diagnosis not present

## 2018-03-16 DIAGNOSIS — E7800 Pure hypercholesterolemia, unspecified: Secondary | ICD-10-CM | POA: Diagnosis not present

## 2018-03-16 DIAGNOSIS — R5383 Other fatigue: Secondary | ICD-10-CM | POA: Diagnosis not present

## 2018-03-16 DIAGNOSIS — Z79899 Other long term (current) drug therapy: Secondary | ICD-10-CM | POA: Diagnosis not present

## 2018-03-16 DIAGNOSIS — Z1331 Encounter for screening for depression: Secondary | ICD-10-CM | POA: Diagnosis not present

## 2018-03-16 DIAGNOSIS — I10 Essential (primary) hypertension: Secondary | ICD-10-CM | POA: Diagnosis not present

## 2018-03-16 DIAGNOSIS — E1142 Type 2 diabetes mellitus with diabetic polyneuropathy: Secondary | ICD-10-CM | POA: Diagnosis not present

## 2018-03-16 DIAGNOSIS — I739 Peripheral vascular disease, unspecified: Secondary | ICD-10-CM | POA: Diagnosis not present

## 2018-03-16 DIAGNOSIS — Z299 Encounter for prophylactic measures, unspecified: Secondary | ICD-10-CM | POA: Diagnosis not present

## 2018-03-16 DIAGNOSIS — Z6836 Body mass index (BMI) 36.0-36.9, adult: Secondary | ICD-10-CM | POA: Diagnosis not present

## 2018-03-19 DIAGNOSIS — C44622 Squamous cell carcinoma of skin of right upper limb, including shoulder: Secondary | ICD-10-CM | POA: Diagnosis not present

## 2018-03-19 DIAGNOSIS — L820 Inflamed seborrheic keratosis: Secondary | ICD-10-CM | POA: Diagnosis not present

## 2018-03-20 DIAGNOSIS — B999 Unspecified infectious disease: Secondary | ICD-10-CM | POA: Diagnosis not present

## 2018-03-20 DIAGNOSIS — Z96651 Presence of right artificial knee joint: Secondary | ICD-10-CM | POA: Diagnosis not present

## 2018-03-23 DIAGNOSIS — Z96653 Presence of artificial knee joint, bilateral: Secondary | ICD-10-CM | POA: Diagnosis not present

## 2018-03-23 DIAGNOSIS — M25561 Pain in right knee: Secondary | ICD-10-CM | POA: Diagnosis not present

## 2018-03-23 DIAGNOSIS — R937 Abnormal findings on diagnostic imaging of other parts of musculoskeletal system: Secondary | ICD-10-CM | POA: Diagnosis not present

## 2018-03-23 DIAGNOSIS — M7989 Other specified soft tissue disorders: Secondary | ICD-10-CM | POA: Diagnosis not present

## 2018-03-27 DIAGNOSIS — Z96651 Presence of right artificial knee joint: Secondary | ICD-10-CM | POA: Diagnosis not present

## 2018-04-10 DIAGNOSIS — Z09 Encounter for follow-up examination after completed treatment for conditions other than malignant neoplasm: Secondary | ICD-10-CM | POA: Diagnosis not present

## 2018-04-10 DIAGNOSIS — Z96651 Presence of right artificial knee joint: Secondary | ICD-10-CM | POA: Diagnosis not present

## 2018-04-10 DIAGNOSIS — M25561 Pain in right knee: Secondary | ICD-10-CM | POA: Diagnosis not present

## 2018-04-17 DIAGNOSIS — K7460 Unspecified cirrhosis of liver: Secondary | ICD-10-CM | POA: Diagnosis not present

## 2018-04-17 DIAGNOSIS — M25561 Pain in right knee: Secondary | ICD-10-CM | POA: Diagnosis not present

## 2018-04-17 DIAGNOSIS — Z6838 Body mass index (BMI) 38.0-38.9, adult: Secondary | ICD-10-CM | POA: Diagnosis not present

## 2018-04-17 DIAGNOSIS — E1165 Type 2 diabetes mellitus with hyperglycemia: Secondary | ICD-10-CM | POA: Diagnosis not present

## 2018-04-17 DIAGNOSIS — I10 Essential (primary) hypertension: Secondary | ICD-10-CM | POA: Diagnosis not present

## 2018-04-17 DIAGNOSIS — Z299 Encounter for prophylactic measures, unspecified: Secondary | ICD-10-CM | POA: Diagnosis not present

## 2018-04-24 ENCOUNTER — Encounter: Payer: Self-pay | Admitting: Gastroenterology

## 2018-04-24 ENCOUNTER — Encounter: Payer: Self-pay | Admitting: *Deleted

## 2018-04-24 ENCOUNTER — Ambulatory Visit (INDEPENDENT_AMBULATORY_CARE_PROVIDER_SITE_OTHER): Payer: Medicare Other | Admitting: Gastroenterology

## 2018-04-24 VITALS — BP 116/67 | HR 60 | Temp 97.4°F | Ht 71.0 in | Wt 265.8 lb

## 2018-04-24 DIAGNOSIS — K7460 Unspecified cirrhosis of liver: Secondary | ICD-10-CM

## 2018-04-24 NOTE — Patient Instructions (Signed)
We have ordered an ultrasound in the near future and blood work.  I am glad you are doing well!  We will see you in 6 months!  It was a pleasure to see you today. I strive to create trusting relationships with patients to provide genuine, compassionate, and quality care. I value your feedback. If you receive a survey regarding your visit,  I greatly appreciate you taking time to fill this out.   Annitta Needs, PhD, ANP-BC [facility]

## 2018-04-24 NOTE — Progress Notes (Signed)
Referring Provider: Monico Blitz, MD Primary Care Physician:  Monico Blitz, MD Primary GI: Dr. Oneida Alar   Chief Complaint  Patient presents with  . Cirrhosis    f/u. Doing okay  . Consult    TCS    HPI:   Jared Martinez is a 77 y.o. male presenting today with a history of  likely NASH cirrhosis. Previously followed by Dr. Britta Mccreedy. Established care with [facility] in July 2018. Labs with negative Hep B surface antibody and core antibody, Hep C antibody negative. ANA negative. Ferritin 84. Hep A total antibody negative. Alpha-1 antitrypsin normal at 112. Hep B surface antigen negative. Completed Hep A and B vaccination in the past. Last colonoscopy in 2016 by Dr. Britta Mccreedy with multiple sessile polyps (tubular adenomas), diverticulosis in left colon, small angiodysplastic lesion in transverse and ascending colon. Last EGD in Sept 2017 with 3 columns of medium-sized varices without stigmata of bleeding. Otherwise normal. On Nadolol for bleeding prophylaxis.   Has upcoming appt with [facility] in near future due to need for right knee surgery. Wants to hold off on colonoscopy until after mowing season is over. Will need letter for upcoming orthopedic surgery at some point. He has no abdominal pain, N/V. No overt GI bleeding. No GI complaints.   Past Medical History:  Diagnosis Date  . Anxiety   . Arthritis   . Bilateral edema of lower extremity    right > left --- per pt cellulitis right lower leg 2016 , has had swelling since  . Bladder outlet obstruction   . BPH (benign prostatic hyperplasia)   . Depression   . GERD (gastroesophageal reflux disease)   . Hepatic cirrhosis, unspecified hepatic cirrhosis type ([place]) dx 2016   per pt non-alcoholic fatty liver---  pt established care w/ dr fields [facility])--  04-26-2017  liver ultrasound --  cirrohoisis ,  no asites  . History of adenomatous polyp of colon    2016  tubular adenoma's  (per dr fields note)  .  Hyperlipidemia   . Hypertension   . Lower urinary tract symptoms (LUTS)   . OSA (obstructive sleep apnea)    per pt has CPAP but has not used since Jan 2018-  hx sleep apnea surgery's  . Pre-diabetes     Past Surgical History:  Procedure Laterality Date  . APPENDECTOMY  teen  . CATARACT EXTRACTION W/ INTRAOCULAR LENS  IMPLANT, BILATERAL  2017  . COLONOSCOPY  2016   multiple sessile polyps (tubular adenomas), diverticulosis in left colon, small angiodysplastic lesion in transverse and ascending colon.  . CYSTOSCOPY N/A 05/04/2017   Procedure: Evie Lacks DILITATION;  Surgeon: Irine Seal, MD;  Location: [facility];  Service: Urology;  Laterality: N/A;  . ESOPHAGOGASTRODUODENOSCOPY  06/2016   3 columns of medium-sized varices without stigmata of bleeding. Otherwise normal.  . NASAL SEPTUM SURGERY  x2  last one yrs ago  . PILONIDAL CYST EXCISION  x3  last one 1970's  . SHOULDER ARTHROSCOPY Right 2010 approx.  Marland Kitchen TOTAL KNEE ARTHROPLASTY  01/02/2012   Procedure: TOTAL KNEE ARTHROPLASTY;  Surgeon: Lorn Junes, MD;  Location: [facility];  Service: Orthopedics;  Laterality: Right;  . TOTAL KNEE ARTHROPLASTY Left [place] , [place] RESECTION OF PROSTATE N/A 05/04/2017   Procedure: TRANSURETHRAL RESECTION  OF THE PROSTATE;  Surgeon: Irine Seal, MD;  Location: [facility];  Service: Urology;  Laterality: N/A;  . UVULOPALATOPHARYNGOPLASTY  ?unknown date  w/  bilateral Tonsillectomy    Current Outpatient Medications  Medication Sig Dispense Refill  . aspirin EC 81 MG tablet Take 1 tablet (81 mg total) by mouth daily.    . cetirizine (ZYRTEC) 10 MG tablet Take 10 mg by mouth every morning.     . citalopram (CELEXA) 20 MG tablet Take 20 mg by mouth every evening.    . cycloSPORINE (RESTASIS) 0.05 % ophthalmic emulsion Place 1 drop into both eyes as needed.     . finasteride (PROSCAR) 5 MG tablet Take 5 mg by mouth every morning.     .  furosemide (LASIX) 40 MG tablet Take 40 mg by mouth every evening.     Marland Kitchen lisinopril (PRINIVIL,ZESTRIL) 20 MG tablet Take 20 mg by mouth every morning.     . metFORMIN (GLUCOPHAGE) 500 MG tablet Take 500 mg by mouth daily with breakfast.    . mirtazapine (REMERON) 15 MG tablet Take 15 mg by mouth at bedtime.    . nadolol (CORGARD) 40 MG tablet TAKE 1 TABLET EVERY EVENING 90 tablet 3  . tolterodine (DETROL LA) 4 MG 24 hr capsule Take 1 capsule by mouth daily.     No current facility-administered medications for this visit.     Allergies as of 04/24/2018 - Review Complete 04/24/2018  Allergen Reaction Noted  . Doxycycline Nausea And Vomiting 04/24/2018  . Flomax [tamsulosin] Other (See Comments) 04/27/2017  . Oxybutynin Nausea Only 04/27/2017  . Adhesive [tape] Rash 12/19/2011    Family History  Problem Relation Age of Onset  . Alzheimer's disease Mother   . Liver cancer Father   . Arthritis Sister   . Arthritis Brother   . Colon cancer Neg Hx   . Colon polyps Neg Hx     Social History   Socioeconomic History  . Marital status: Divorced    Spouse name: Not on file  . Number of children: Not on file  . Years of education: Not on file  . Highest education level: Not on file  Occupational History  . 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 Financial resource strain: Not on file  . Food insecurity:    Worry: Not on file    Inability: Not on file  . Transportation needs:    Medical: Not on file    Non-medical: Not on file  Tobacco Use  . Smoking status: Former Smoker    Years: 25.00    Types: Cigarettes    Last attempt to quit: 12/26/1992    Years since quitting: 25.3  . Smokeless tobacco: Former Systems developer    Types: Snuff    Quit date: 04/27/1993  . Tobacco comment: hx light smoker  Substance and Sexual Activity  . Alcohol use: No  . Drug use: No  . Sexual activity: Not on file  Lifestyle  . Physical activity:    Days per week:  Not on file    Minutes per session: Not on file  . Stress: Not on file  Relationships  . Social connections:    Talks on phone: Not on file    Gets together: Not on file    Attends religious service: Not on file    Active member of club or organization: Not on file    Attends meetings of clubs or organizations: Not on file    Relationship status: Not on file  Other Topics Concern  . Not on file  Social History Narrative  . Not on file    Review of Systems: Gen: Denies fever, chills, anorexia. Denies fatigue, weakness, weight loss.  CV: Denies chest pain, palpitations, syncope, peripheral edema, and claudication. Resp: Denies dyspnea at rest, cough, wheezing, coughing up blood, and pleurisy. GI: see HPI  Derm: Denies rash, itching, dry skin Psych: Denies depression, anxiety, memory loss, confusion. No homicidal or suicidal ideation.  Heme: Denies bruising, bleeding, and enlarged lymph nodes.  Physical Exam: BP 116/67   Pulse 60   Temp (!) 97.4 F (36.3 C) (Oral)   Ht 5\' 11"  (1.803 m)   Wt 265 lb 12.8 oz (120.6 kg)   BMI 37.07 kg/m  General:   Alert and oriented. No distress noted. Pleasant and cooperative.  Head:  Normocephalic and atraumatic. Eyes:  Conjuctiva clear without scleral icterus. Mouth:  Oral mucosa pink and moist.  Abdomen:  +BS, soft, non-tender and non-distended. No rebound or guarding. No HSM. Small umbilical hernia  Msk:  Symmetrical without gross deformities. Normal posture. Extremities:  Without edema. Neurologic:  Alert and  oriented x4 Psych:  Alert and cooperative. Normal mood and affect.

## 2018-04-26 ENCOUNTER — Ambulatory Visit (HOSPITAL_COMMUNITY)
Admission: RE | Admit: 2018-04-26 | Discharge: 2018-04-26 | Disposition: A | Discharge status: Home / Self Care | Payer: Medicare Other | Source: Ambulatory Visit | Attending: Gastroenterology | Admitting: Gastroenterology

## 2018-04-26 DIAGNOSIS — K7460 Unspecified cirrhosis of liver: Secondary | ICD-10-CM | POA: Diagnosis not present

## 2018-04-27 LAB — CBC WITH DIFFERENTIAL/PLATELET
Basophils Absolute: 20 {cells}/uL (ref 0–200)
Basophils Relative: 0.5 %
Eosinophils Absolute: 188 {cells}/uL (ref 15–500)
Eosinophils Relative: 4.7 %
HCT: 42.7 % (ref 38.5–50.0)
Hemoglobin: 15.2 g/dL (ref 13.2–17.1)
Lymphs Abs: 1744 {cells}/uL (ref 850–3900)
MCH: 34.2 pg — ABNORMAL HIGH (ref 27.0–33.0)
MCHC: 35.6 g/dL (ref 32.0–36.0)
MCV: 96 fL (ref 80.0–100.0)
MPV: 11.7 fL (ref 7.5–12.5)
Monocytes Relative: 9.5 %
Neutro Abs: 1668 {cells}/uL (ref 1500–7800)
Neutrophils Relative %: 41.7 %
Platelets: 84 10*3/uL — ABNORMAL LOW (ref 140–400)
RBC: 4.45 10*6/uL (ref 4.20–5.80)
RDW: 12.9 % (ref 11.0–15.0)
Total Lymphocyte: 43.6 %
WBC mixed population: 380 {cells}/uL (ref 200–950)
WBC: 4 10*3/uL (ref 3.8–10.8)

## 2018-04-27 LAB — AFP TUMOR MARKER: AFP-Tumor Marker: 4.6 ng/mL (ref <6.1)

## 2018-04-27 LAB — COMPLETE METABOLIC PANEL WITH GFR
AG Ratio: 1.2 (calc) (ref 1.0–2.5)
ALT: 22 U/L (ref 9–46)
AST: 32 U/L (ref 10–35)
Albumin: 3.8 g/dL (ref 3.6–5.1)
Alkaline phosphatase (APISO): 67 U/L (ref 40–115)
BUN/Creatinine Ratio: 15 (calc) (ref 6–22)
BUN: 21 mg/dL (ref 7–25)
CO2: 29 mmol/L (ref 20–32)
Calcium: 9.6 mg/dL (ref 8.6–10.3)
Chloride: 105 mmol/L (ref 98–110)
Creat: 1.37 mg/dL — ABNORMAL HIGH (ref 0.70–1.18)
GFR, Est African American: 57 mL/min/{1.73_m2} — ABNORMAL LOW (ref >=60)
GFR, Est Non African American: 49 mL/min/{1.73_m2} — ABNORMAL LOW (ref >=60)
Globulin: 3.1 g/dL (ref 1.9–3.7)
Glucose, Bld: 129 mg/dL — ABNORMAL HIGH (ref 65–99)
Potassium: 4.2 mmol/L (ref 3.5–5.3)
Sodium: 142 mmol/L (ref 135–146)
Total Bilirubin: 1 mg/dL (ref 0.2–1.2)
Total Protein: 6.9 g/dL (ref 6.1–8.1)

## 2018-04-27 LAB — PROTIME-INR
INR: 1.1
Prothrombin Time: 11.3 s (ref 9.0–11.5)

## 2018-04-27 NOTE — Assessment & Plan Note (Signed)
77 year old very pleasant male with likely NASH cirrhosis, fairly well compensated at this time. Due for [place] now. On Nadolol for bleeding prophylaxis. Needs updated labs. Will review labs and then provide the surgical risk score for orthopedist (found at [email or web address]).   As of note, needs surveillance colonoscopy if health permits. He desires to discuss this at next visit. Will see him in 6 months or sooner if needed.

## 2018-04-30 NOTE — Progress Notes (Signed)
Jared Martinez: your ultrasound is stable. We will repeat in 6 months! Sent in [place]. Will arrange at next visit.

## 2018-04-30 NOTE — Progress Notes (Signed)
LMOM to call.

## 2018-05-01 ENCOUNTER — Telehealth: Payer: Self-pay | Admitting: Gastroenterology

## 2018-05-01 NOTE — Telephone Encounter (Signed)
Pt was returning a call. [phone]

## 2018-05-01 NOTE — Telephone Encounter (Signed)
Pt is aware of his [place] results and plan.

## 2018-05-01 NOTE — Progress Notes (Signed)
Forwarding to Manuela Schwartz to nic the next [place].

## 2018-05-01 NOTE — Progress Notes (Signed)
cc'ed to pcp °

## 2018-05-02 NOTE — Progress Notes (Signed)
MELD Na is 11. Renal function slightly decreased from last on file. I don't know if he has had another creatinine that we are not aware of. Please copy to his PCP and let patient know this needs to be followed up on. Otherwise, no significant findings!

## 2018-05-03 NOTE — Progress Notes (Signed)
LMOM to call.

## 2018-05-04 NOTE — Progress Notes (Signed)
LMOM to call.  Jared Martinez, please send copy of labs to PCP.

## 2018-05-04 NOTE — Progress Notes (Signed)
Pt is aware of results and to follow up with PCP, Dr. Manuella Ghazi!

## 2018-06-22 DIAGNOSIS — M159 Polyosteoarthritis, unspecified: Secondary | ICD-10-CM | POA: Diagnosis not present

## 2018-06-22 DIAGNOSIS — I10 Essential (primary) hypertension: Secondary | ICD-10-CM | POA: Diagnosis not present

## 2018-06-22 DIAGNOSIS — E119 Type 2 diabetes mellitus without complications: Secondary | ICD-10-CM | POA: Diagnosis not present

## 2018-06-22 DIAGNOSIS — E7800 Pure hypercholesterolemia, unspecified: Secondary | ICD-10-CM | POA: Diagnosis not present

## 2018-07-02 DIAGNOSIS — Z85828 Personal history of other malignant neoplasm of skin: Secondary | ICD-10-CM | POA: Diagnosis not present

## 2018-07-02 DIAGNOSIS — L570 Actinic keratosis: Secondary | ICD-10-CM | POA: Diagnosis not present

## 2018-07-02 DIAGNOSIS — B009 Herpesviral infection, unspecified: Secondary | ICD-10-CM | POA: Diagnosis not present

## 2018-07-02 DIAGNOSIS — L821 Other seborrheic keratosis: Secondary | ICD-10-CM | POA: Diagnosis not present

## 2018-07-02 DIAGNOSIS — L905 Scar conditions and fibrosis of skin: Secondary | ICD-10-CM | POA: Diagnosis not present

## 2018-07-02 DIAGNOSIS — L820 Inflamed seborrheic keratosis: Secondary | ICD-10-CM | POA: Diagnosis not present

## 2018-07-10 ENCOUNTER — Other Ambulatory Visit: Payer: Self-pay | Admitting: Orthopedic Surgery

## 2018-07-17 ENCOUNTER — Encounter: Payer: Self-pay | Admitting: Gastroenterology

## 2018-07-17 NOTE — Progress Notes (Signed)
Received request for surgical clearance for orthopedic surgery. Letter completed for [facility], which includes the Post-operative Mortality Risk in Patients with Cirrhosis calculation.

## 2018-07-19 ENCOUNTER — Telehealth: Payer: Self-pay

## 2018-07-19 NOTE — Telephone Encounter (Signed)
LATE ENTRY:   Received request for surgical clearance for revision right TKA PATELLA. See letter written by Roseanne Kaufman, NP dated 07/17/2018 and faxed to [facility] on 07/18/2018. Letter being scanned into epic.

## 2018-07-20 NOTE — Patient Instructions (Signed)
Jared Martinez  1941-06-22    Your procedure is scheduled on:  07-30-2018   Report to [facility] Main  Entrance,  Report to admitting at  5:30 AM    Call this number if you have problems the morning of surgery [phone]     Remember: Do not eat food or drink liquids :After Midnight.                    BRUSH YOUR TEETH MORNING OF SURGERY AND RINSE YOUR MOUTH OUT, NO CHEWING GUM CANDY OR MINTS.     Take these medicines the morning of surgery with A SIP OF WATER:   Zyrtec, Finasteride                           DO NOT TAKE ANY DIABETIC MEDICATIONS DAY OF YOUR SURGERY                                           You may not have any metal on your body including hair pins and              piercings               Do not wear jewelry, lotions, powders or perfumes, deodorant              Men may shave face and neck.   Do not bring valuables to the hospital. Jared Martinez.  Contacts, dentures or bridgework may not be worn into surgery.  Leave suitcase in the car. After surgery it may be brought to your room.     Special Instructions: N/A              Please read over the following fact sheets you were given: _____________________________________________________________________             [facility] - Preparing for Surgery Before surgery, you can play an important role.  Because skin is not sterile, your skin needs to be as free of germs as possible.  You can reduce the number of germs on your skin by washing with CHG (chlorahexidine gluconate) soap before surgery.  CHG is an antiseptic cleaner which kills germs and bonds with the skin to continue killing germs even after washing. Please DO NOT use if you have an allergy to CHG or antibacterial soaps.  If your skin becomes reddened/irritated stop using the CHG and inform your nurse when you arrive at Short Stay. Do not shave (including legs and underarms) for  at least 48 hours prior to the first CHG shower.  You may shave your face/neck. Please follow these instructions carefully:  1.  Shower with CHG Soap the night before surgery and the  morning of Surgery.  2.  If you choose to wash your hair, wash your hair first as usual with your  normal  shampoo.  3.  After you shampoo, rinse your hair and body thoroughly to remove the  shampoo.                            4.  Use  CHG as you would any other liquid soap.  You can apply chg directly  to the skin and wash                       Gently with a scrungie or clean washcloth.  5.  Apply the CHG Soap to your body ONLY FROM THE NECK DOWN.   Do not use on face/ open                           Wound or open sores. Avoid contact with eyes, ears mouth and genitals (private parts).                       Wash face,  Genitals (private parts) with your normal soap.             6.  Wash thoroughly, paying special attention to the area where your surgery  will be performed.  7.  Thoroughly rinse your body with warm water from the neck down.  8.  DO NOT shower/wash with your normal soap after using and rinsing off  the CHG Soap.             9.  Pat yourself dry with a clean towel.            10.  Wear clean pajamas.            11.  Place clean sheets on your bed the night of your first shower and do not  sleep with pets. Day of Surgery : Do not apply any lotions/deodorants the morning of surgery.  Please wear clean clothes to the hospital/surgery center.  FAILURE TO FOLLOW THESE INSTRUCTIONS MAY RESULT IN THE CANCELLATION OF YOUR SURGERY PATIENT SIGNATURE_________________________________  NURSE SIGNATURE__________________________________  ________________________________________________________________________   Jared Martinez  An incentive spirometer is a tool that can help keep your lungs clear and active. This tool measures how well you are filling your lungs with each breath. Taking long deep breaths  may help reverse or decrease the chance of developing breathing (pulmonary) problems (especially infection) following:  A long period of time when you are unable to move or be active. BEFORE THE PROCEDURE   If the spirometer includes an indicator to show your best effort, your nurse or respiratory therapist will set it to a desired goal.  If possible, sit up straight or lean slightly forward. Try not to slouch.  Hold the incentive spirometer in an upright position. INSTRUCTIONS FOR USE  1. Sit on the edge of your bed if possible, or sit up as far as you can in bed or on a chair. 2. Hold the incentive spirometer in an upright position. 3. Breathe out normally. 4. Place the mouthpiece in your mouth and seal your lips tightly around it. 5. Breathe in slowly and as deeply as possible, raising the piston or the ball toward the top of the column. 6. Hold your breath for 3-5 seconds or for as long as possible. Allow the piston or ball to fall to the bottom of the column. 7. Remove the mouthpiece from your mouth and breathe out normally. 8. Rest for a few seconds and repeat Steps 1 through 7 at least 10 times every 1-2 hours when you are awake. Take your time and take a few normal breaths between deep breaths. 9. The spirometer may include an indicator to show your best effort. Use the indicator  as a goal to work toward during each repetition. 10. After each set of 10 deep breaths, practice coughing to be sure your lungs are clear. If you have an incision (the cut made at the time of surgery), support your incision when coughing by placing a pillow or rolled up towels firmly against it. Once you are able to get out of bed, walk around indoors and cough well. You may stop using the incentive spirometer when instructed by your caregiver.  RISKS AND COMPLICATIONS  Take your time so you do not get dizzy or light-headed.  If you are in pain, you may need to take or ask for pain medication before doing  incentive spirometry. It is harder to take a deep breath if you are having pain. AFTER USE  Rest and breathe slowly and easily.  It can be helpful to keep track of a log of your progress. Your caregiver can provide you with a simple table to help with this. If you are using the spirometer at home, follow these instructions: [place] IF:   You are having difficultly using the spirometer.  You have trouble using the spirometer as often as instructed.  Your pain medication is not giving enough relief while using the spirometer.  You develop fever of 100.5 F (38.1 C) or higher. SEEK IMMEDIATE MEDICAL CARE IF:   You cough up bloody sputum that had not been present before.  You develop fever of 102 F (38.9 C) or greater.  You develop worsening pain at or near the incision site. MAKE SURE YOU:   Understand these instructions.  Will watch your condition.  Will get help right away if you are not doing well or get worse. Document Released: 02/13/2007 Document Revised: 12/26/2011 Document Reviewed: 04/16/2007 ExitCare Patient Information 2014 Exit[employer], [place].   ________________________________________________________________________  WHAT IS A BLOOD TRANSFUSION? Blood Transfusion Information  A transfusion is the replacement of blood or some of its parts. Blood is made up of multiple cells which provide different functions.  Red blood cells carry oxygen and are used for blood loss replacement.  White blood cells fight against infection.  Platelets control bleeding.  Plasma helps clot blood.  Other blood products are available for specialized needs, such as hemophilia or other clotting disorders. BEFORE THE TRANSFUSION  Who gives blood for transfusions?   Healthy volunteers who are fully evaluated to make sure their blood is safe. This is blood bank blood. Transfusion therapy is the safest it has ever been in the practice of medicine. Before blood is taken from a  donor, a complete history is taken to make sure that person has no history of diseases nor engages in risky social behavior (examples are intravenous drug use or sexual activity with multiple partners). The donor's travel history is screened to minimize risk of transmitting infections, such as malaria. The donated blood is tested for signs of infectious diseases, such as HIV and hepatitis. The blood is then tested to be sure it is compatible with you in order to minimize the chance of a transfusion reaction. If you or a relative donates blood, this is often done in anticipation of surgery and is not appropriate for emergency situations. It takes many days to process the donated blood. RISKS AND COMPLICATIONS Although transfusion therapy is very safe and saves many lives, the main dangers of transfusion include:   Getting an infectious disease.  Developing a transfusion reaction. This is an allergic reaction to something in the blood you were  given. Every precaution is taken to prevent this. The decision to have a blood transfusion has been considered carefully by your caregiver before blood is given. Blood is not given unless the benefits outweigh the risks. AFTER THE TRANSFUSION  Right after receiving a blood transfusion, you will usually feel much better and more energetic. This is especially true if your red blood cells have gotten low (anemic). The transfusion raises the level of the red blood cells which carry oxygen, and this usually causes an energy increase.  The nurse administering the transfusion will monitor you carefully for complications. HOME CARE INSTRUCTIONS  No special instructions are needed after a transfusion. You may find your energy is better. Speak with your caregiver about any limitations on activity for underlying diseases you may have. SEEK MEDICAL CARE IF:   Your condition is not improving after your transfusion.  You develop redness or irritation at the intravenous (IV)  site. SEEK IMMEDIATE MEDICAL CARE IF:  Any of the following symptoms occur over the next 12 hours:  Shaking chills.  You have a temperature by mouth above 102 F (38.9 C), not controlled by medicine.  Chest, back, or muscle pain.  People around you feel you are not acting correctly or are confused.  Shortness of breath or difficulty breathing.  Dizziness and fainting.  You get a rash or develop hives.  You have a decrease in urine output.  Your urine turns a dark color or changes to pink, red, or brown. Any of the following symptoms occur over the next 10 days:  You have a temperature by mouth above 102 F (38.9 C), not controlled by medicine.  Shortness of breath.  Weakness after normal activity.  The white part of the eye turns yellow (jaundice).  You have a decrease in the amount of urine or are urinating less often.  Your urine turns a dark color or changes to pink, red, or brown. Document Released: 09/30/2000 Document Revised: 12/26/2011 Document Reviewed: 05/19/2008 [facility] Patient Information 2014 [place], [place].  _______________________________________________________________________

## 2018-07-23 ENCOUNTER — Other Ambulatory Visit: Payer: Self-pay

## 2018-07-23 ENCOUNTER — Encounter (HOSPITAL_COMMUNITY)
Admission: RE | Admit: 2018-07-23 | Discharge: 2018-07-23 | Disposition: A | Discharge status: Home / Self Care | Payer: Medicare Other | Source: Ambulatory Visit | Attending: Orthopedic Surgery | Admitting: Orthopedic Surgery

## 2018-07-23 ENCOUNTER — Encounter (HOSPITAL_COMMUNITY): Payer: Self-pay

## 2018-07-23 ENCOUNTER — Ambulatory Visit (HOSPITAL_COMMUNITY)
Admission: RE | Admit: 2018-07-23 | Discharge: 2018-07-23 | Disposition: A | Discharge status: Home / Self Care | Payer: Medicare Other | Source: Ambulatory Visit | Attending: Orthopedic Surgery | Admitting: Orthopedic Surgery

## 2018-07-23 DIAGNOSIS — E119 Type 2 diabetes mellitus without complications: Secondary | ICD-10-CM | POA: Insufficient documentation

## 2018-07-23 DIAGNOSIS — I440 Atrioventricular block, first degree: Secondary | ICD-10-CM | POA: Insufficient documentation

## 2018-07-23 DIAGNOSIS — Z87891 Personal history of nicotine dependence: Secondary | ICD-10-CM | POA: Insufficient documentation

## 2018-07-23 DIAGNOSIS — Z01818 Encounter for other preprocedural examination: Secondary | ICD-10-CM

## 2018-07-23 DIAGNOSIS — J984 Other disorders of lung: Secondary | ICD-10-CM | POA: Diagnosis not present

## 2018-07-23 HISTORY — DX: Atrioventricular block, first degree: I44.0

## 2018-07-23 HISTORY — DX: Nocturia: R35.1

## 2018-07-23 HISTORY — DX: Type 2 diabetes mellitus without complications: E11.9

## 2018-07-23 HISTORY — DX: Unspecified right bundle-branch block: I45.10

## 2018-07-23 LAB — URINALYSIS, ROUTINE W REFLEX MICROSCOPIC
Bilirubin Urine: NEGATIVE
Glucose, UA: NEGATIVE mg/dL
Hgb urine dipstick: NEGATIVE
Ketones, ur: NEGATIVE mg/dL
Leukocytes, UA: NEGATIVE
Nitrite: NEGATIVE
Protein, ur: NEGATIVE mg/dL
Specific Gravity, Urine: 1.017 (ref 1.005–1.030)
pH: 5 (ref 5.0–8.0)

## 2018-07-23 LAB — ABO/RH: ABO/RH(D): A POS

## 2018-07-23 LAB — HEMOGLOBIN A1C
Hgb A1c MFr Bld: 6.6 % — ABNORMAL HIGH (ref 4.8–5.6)
Mean Plasma Glucose: 142.72 mg/dL

## 2018-07-23 LAB — CBC WITH DIFFERENTIAL/PLATELET
Basophils Absolute: 0 10*3/uL (ref 0.0–0.1)
Basophils Relative: 0 %
Eosinophils Absolute: 0.1 10*3/uL (ref 0.0–0.7)
Eosinophils Relative: 3 %
HCT: 44.4 % (ref 39.0–52.0)
Hemoglobin: 15 g/dL (ref 13.0–17.0)
Lymphocytes Relative: 44 %
Lymphs Abs: 2.1 10*3/uL (ref 0.7–4.0)
MCH: 34.2 pg — ABNORMAL HIGH (ref 26.0–34.0)
MCHC: 33.8 g/dL (ref 30.0–36.0)
MCV: 101.4 fL — ABNORMAL HIGH (ref 78.0–100.0)
Monocytes Absolute: 0.5 10*3/uL (ref 0.1–1.0)
Monocytes Relative: 11 %
Neutro Abs: 2 10*3/uL (ref 1.7–7.7)
Neutrophils Relative %: 42 %
Platelets: 95 10*3/uL — ABNORMAL LOW (ref 150–400)
RBC: 4.38 MIL/uL (ref 4.22–5.81)
RDW: 13 % (ref 11.5–15.5)
WBC: 4.7 10*3/uL (ref 4.0–10.5)

## 2018-07-23 LAB — SURGICAL PCR SCREEN
MRSA, PCR: NEGATIVE
Staphylococcus aureus: NEGATIVE

## 2018-07-23 LAB — COMPREHENSIVE METABOLIC PANEL
ALT: 25 U/L (ref 0–44)
AST: 36 U/L (ref 15–41)
Albumin: 3.7 g/dL (ref 3.5–5.0)
Alkaline Phosphatase: 68 U/L (ref 38–126)
Anion gap: 8 (ref 5–15)
BUN: 22 mg/dL (ref 8–23)
CO2: 29 mmol/L (ref 22–32)
Calcium: 9.2 mg/dL (ref 8.9–10.3)
Chloride: 105 mmol/L (ref 98–111)
Creatinine, Ser: 1.28 mg/dL — ABNORMAL HIGH (ref 0.61–1.24)
GFR calc Af Amer: 60 mL/min — ABNORMAL LOW (ref >60)
GFR calc non Af Amer: 52 mL/min — ABNORMAL LOW (ref >60)
Glucose, Bld: 105 mg/dL — ABNORMAL HIGH (ref 70–99)
Potassium: 4.2 mmol/L (ref 3.5–5.1)
Sodium: 142 mmol/L (ref 135–145)
Total Bilirubin: 1.7 mg/dL — ABNORMAL HIGH (ref 0.3–1.2)
Total Protein: 7.2 g/dL (ref 6.5–8.1)

## 2018-07-23 LAB — GLUCOSE, CAPILLARY: Glucose-Capillary: 97 mg/dL (ref 70–99)

## 2018-07-23 LAB — APTT: aPTT: 30 s (ref 24–36)

## 2018-07-23 LAB — PROTIME-INR
INR: 1.06
Prothrombin Time: 13.7 s (ref 11.4–15.2)

## 2018-07-23 NOTE — Progress Notes (Signed)
Routed CXR and CBCdiff results dated 07-23-2018 to dr Mayer Camel in epic. Final EKG dated 07-23-2018 in epic.  Pt surgical clearance from dr fields dated 07-17-2018 in chart.Marland Kitchen  LOV note in eipc dated 04-24-2018.

## 2018-07-24 DIAGNOSIS — Z23 Encounter for immunization: Secondary | ICD-10-CM | POA: Diagnosis not present

## 2018-07-25 DIAGNOSIS — E1142 Type 2 diabetes mellitus with diabetic polyneuropathy: Secondary | ICD-10-CM | POA: Diagnosis not present

## 2018-07-25 DIAGNOSIS — T8484XA Pain due to internal orthopedic prosthetic devices, implants and grafts, initial encounter: Secondary | ICD-10-CM | POA: Diagnosis present

## 2018-07-25 DIAGNOSIS — Z6838 Body mass index (BMI) 38.0-38.9, adult: Secondary | ICD-10-CM | POA: Diagnosis not present

## 2018-07-25 DIAGNOSIS — Z96651 Presence of right artificial knee joint: Secondary | ICD-10-CM

## 2018-07-25 DIAGNOSIS — M25561 Pain in right knee: Secondary | ICD-10-CM | POA: Diagnosis not present

## 2018-07-25 DIAGNOSIS — Z299 Encounter for prophylactic measures, unspecified: Secondary | ICD-10-CM | POA: Diagnosis not present

## 2018-07-25 DIAGNOSIS — I10 Essential (primary) hypertension: Secondary | ICD-10-CM | POA: Diagnosis not present

## 2018-07-25 DIAGNOSIS — E1165 Type 2 diabetes mellitus with hyperglycemia: Secondary | ICD-10-CM | POA: Diagnosis not present

## 2018-07-25 DIAGNOSIS — K7460 Unspecified cirrhosis of liver: Secondary | ICD-10-CM | POA: Diagnosis not present

## 2018-07-25 NOTE — H&P (Signed)
TOTAL KNEE REVISION ADMISSION H&P  Patient is being admitted for right revision total knee arthroplasty.  Subjective:  Chief Complaint:right knee pain.  HPI: Jared Martinez, 77 y.o. male, has a history of pain and functional disability in the right knee(s) due to failed previous arthroplasty and patient has failed non-surgical conservative treatments for greater than 12 weeks to include NSAID's and/or analgesics, flexibility and strengthening excercises, weight reduction as appropriate and activity modification. The indications for the revision of the total knee arthroplasty are impingement of lateral facet of the patella. Onset of symptoms was gradual starting 1 years ago with gradually worsening course since that time.  Prior procedures on the right knee(s) include arthroplasty.  Patient currently rates pain in the right knee(s) at 10 out of 10 with activity. There is night pain, worsening of pain with activity and weight bearing, pain that interferes with activities of daily living, pain with passive range of motion and crepitus.  Patient has evidence of periarticular osteophytes and joint space narrowing by imaging studies. This condition presents safety issues increasing the risk of falls.  There is no current active infection.  Patient Active Problem List   Diagnosis Date Noted  . History of colonic polyps 10/23/2017  . Hepatic cirrhosis ([place]) 04/21/2017  . Postoperative anemia due to acute blood loss 01/05/2012  . Hyponatremia 01/05/2012  . Right knee DJD 12/27/2011  . Hypertension   . GERD (gastroesophageal reflux disease)   . Hyperlipidemia   . Anxiety   . Sleep apnea   . Depression    Past Medical History:  Diagnosis Date  . Anxiety   . Arthritis   . Bilateral edema of lower extremity    right > left --- per pt cellulitis right lower leg 2016 , has had swelling since  . BPH (benign prostatic hyperplasia)   . Depression   . First degree AV block   . Hepatic cirrhosis,  unspecified hepatic cirrhosis type ([place]) dx 2016   per pt non-alcoholic fatty liver---  pt established care w/ dr fields [facility])--  04-26-2017  liver ultrasound --  cirrohoisis ,  no asites  . History of adenomatous polyp of colon    2016  tubular adenoma's  (per dr fields note)  . Hyperlipidemia   . Hypertension   . Lower urinary tract symptoms (LUTS)   . Nocturia   . OSA (obstructive sleep apnea)    per pt has CPAP but has not used since Jan 2018-  hx sleep apnea surgery's  . RBBB (right bundle branch block)   . Type 2 diabetes mellitus ([place])    followed by pcp    Past Surgical History:  Procedure Laterality Date  . APPENDECTOMY  teen  . CATARACT EXTRACTION W/ INTRAOCULAR LENS  IMPLANT, BILATERAL  2017  . COLONOSCOPY  2016   multiple sessile polyps (tubular adenomas), diverticulosis in left colon, small angiodysplastic lesion in transverse and ascending colon.  . CYSTOSCOPY N/A 05/04/2017   Procedure: Evie Lacks DILITATION;  Surgeon: Irine Seal, MD;  Location[facility];  Service: Urology;  Laterality: N/A;  . ESOPHAGOGASTRODUODENOSCOPY  06/2016   3 columns of medium-sized varices without stigmata of bleeding. Otherwise normal.  . NASAL SEPTUM SURGERY  x2  last one yrs ago  . PILONIDAL CYST EXCISION  x3  last one 1970's  . SHOULDER ARTHROSCOPY Right 2010 approx.  Marland Kitchen TOTAL KNEE ARTHROPLASTY  01/02/2012   Procedure: TOTAL KNEE ARTHROPLASTY;  Surgeon: Lorn Junes, MD;  Location: [facility];  Service: Orthopedics;  Laterality: Right;  . TOTAL KNEE ARTHROPLASTY Left [place] RESECTION OF PROSTATE N/A 05/04/2017   Procedure: TRANSURETHRAL RESECTION  OF THE PROSTATE;  Surgeon: Irine Seal, MD;  Location: [facility];  Service: Urology;  Laterality: N/A;  . UVULOPALATOPHARYNGOPLASTY  ?unknown date   w/  bilateral Tonsillectomy    No current facility-administered medications for this encounter.     Current Outpatient Medications  Medication Sig Dispense Refill Last Dose  . acetaminophen (TYLENOL) 500 MG tablet Take 500 mg by mouth every 6 (six) hours as needed for moderate pain or headache.     Marland Kitchen aspirin EC 81 MG tablet Take 1 tablet (81 mg total) by mouth daily.   Taking  . cetirizine (ZYRTEC) 10 MG tablet Take 10 mg by mouth every morning.    Taking  . citalopram (CELEXA) 20 MG tablet Take 20 mg by mouth at bedtime.    Taking  . cycloSPORINE (RESTASIS) 0.05 % ophthalmic emulsion Place 1 drop into both eyes 2 (two) times daily as needed (for dry eyes).    Taking  . finasteride (PROSCAR) 5 MG tablet Take 5 mg by mouth every morning.    Taking  . furosemide (LASIX) 40 MG tablet Take 40 mg by mouth at bedtime.    Taking  . lisinopril (PRINIVIL,ZESTRIL) 20 MG tablet Take 10 mg by mouth every morning.    Taking  . Melatonin 3 MG CAPS Take 3 mg by mouth at bedtime.     . metFORMIN (GLUCOPHAGE) 500 MG tablet Take 500 mg by mouth daily with breakfast.   Taking  . mirtazapine (REMERON) 15 MG tablet Take 15 mg by mouth at bedtime.   Taking  . nadolol (CORGARD) 40 MG tablet TAKE 1 TABLET EVERY EVENING (Patient taking differently: Take 40 mg by mouth at bedtime. ) 90 tablet 3 Taking  . OVER THE COUNTER MEDICATION Apply 1 application topically daily as needed (for infection). Emuaid Cream      Allergies  Allergen Reactions  . Doxycycline Nausea And Vomiting  . Flomax [Tamsulosin] Other (See Comments)    "mouth breaks out"  . Oxybutynin Nausea Only and Other (See Comments)    "severe nausea"  . Adhesive [Tape] Rash    Social History   Tobacco Use  . Smoking status: Former Smoker    Years: 25.00    Types: Cigarettes    Last attempt to quit: 12/26/1992    Years since quitting: 25.5  . Smokeless tobacco: Former Systems developer    Types: Snuff    Quit date: 04/27/1993  Substance Use Topics  . Alcohol use: No    Family History  Problem Relation Age of Onset  . Alzheimer's disease Mother   .  Liver cancer Father   . Arthritis Sister   . Arthritis Brother   . Colon cancer Neg Hx   . Colon polyps Neg Hx       Review of Systems  Constitutional: Negative.   HENT: Positive for hearing loss and tinnitus.   Eyes:       Glasses  Respiratory:       Sleep apnea  Cardiovascular:       Htn and stroke  Gastrointestinal: Negative.   Genitourinary:       Kidney disease  Musculoskeletal: Positive for joint pain.  Neurological: Negative.   Endo/Heme/Allergies: Negative.   Psychiatric/Behavioral: Positive for depression.     Objective:  Physical Exam  Constitutional: He appears well-developed and well-nourished.  HENT:  Head: Normocephalic and atraumatic.  Eyes: Pupils are equal, round, and reactive to light.  Neck: Normal range of motion. Neck supple.  Cardiovascular: Intact distal pulses.  Respiratory: Effort normal.  Musculoskeletal: He exhibits tenderness.  Surgical wound of the right knee is well-healed, trace effusion, collateral ligaments are stable he is tender to palpation over the lateral facet of the patella.  Nontender along the medial retinaculum range of motion 0/125.    Neurological: He is alert.  Skin: Skin is warm and dry.  Psychiatric: He has a normal mood and affect. His behavior is normal. Judgment and thought content normal.    Vital signs in last 24 hours:    Labs:  Estimated body mass index is 37.1 kg/m as calculated from the following:   Height as of 07/23/18: 5\' 11"  (1.803 m).   Weight as of 07/23/18: 120.7 kg.  Imaging Review Plain radiographs demonstrate do show well-placed well fixed components.  On the sunrise view of the patella.  The lateral facet is uncovered and a looks like there is been some remodeling of the bone to the lateral facet.   Preoperative templating of the joint replacement has been completed, documented, and submitted to the Operating Room personnel in order to optimize intra-operative equipment  management.   Assessment/Plan:  End stage arthritis, right knee(s) with failed previous arthroplasty.   The patient history, physical examination, clinical judgment of the provider and imaging studies are consistent with end stage degenerative joint disease of the right knee(s), previous total knee arthroplasty. Revision total knee arthroplasty is deemed medically necessary. The treatment options including medical management, injection therapy, arthroscopy and revision arthroplasty were discussed at length. The risks and benefits of revision total knee arthroplasty were presented and reviewed. The risks due to aseptic loosening, infection, stiffness, patella tracking problems, thromboembolic complications and other imponderables were discussed. The patient acknowledged the explanation, agreed to proceed with the plan and consent was signed. Patient is being admitted for inpatient treatment for surgery, pain control, PT, OT, prophylactic antibiotics, VTE prophylaxis, progressive ambulation and ADL's and discharge planning.The patient is planning to be discharged home with home health services.

## 2018-07-29 MED ORDER — BUPIVACAINE LIPOSOME 1.3 % IJ SUSP
20.0000 mL | Freq: Once | INTRAMUSCULAR | Status: DC
  Filled 2018-07-29: qty 20

## 2018-07-29 MED ORDER — TRANEXAMIC ACID 1000 MG/10ML IV SOLN
2000.0000 mg | INTRAVENOUS | Status: DC
  Filled 2018-07-29: qty 20

## 2018-07-29 MED ORDER — DEXTROSE 5 % IV SOLN
3.0000 g | INTRAVENOUS | Status: AC
  Administered 2018-07-30: 3 g via INTRAVENOUS
  Filled 2018-07-29: qty 3

## 2018-07-30 ENCOUNTER — Inpatient Hospital Stay (HOSPITAL_COMMUNITY): Payer: Medicare Other | Admitting: Registered Nurse

## 2018-07-30 ENCOUNTER — Other Ambulatory Visit: Payer: Self-pay

## 2018-07-30 ENCOUNTER — Inpatient Hospital Stay (HOSPITAL_COMMUNITY)
Admission: RE | Admit: 2018-07-30 | Discharge: 2018-08-02 | DRG: 464 | Disposition: A | Discharge status: Skilled Nursing Facility | Payer: Medicare Other | Source: Ambulatory Visit | Attending: Orthopedic Surgery | Admitting: Orthopedic Surgery

## 2018-07-30 ENCOUNTER — Encounter (HOSPITAL_COMMUNITY): Payer: Self-pay | Admitting: *Deleted

## 2018-07-30 ENCOUNTER — Encounter (HOSPITAL_COMMUNITY)
Admission: RE | Disposition: A | Discharge status: Skilled Nursing Facility | Payer: Self-pay | Source: Ambulatory Visit | Attending: Orthopedic Surgery

## 2018-07-30 DIAGNOSIS — M25761 Osteophyte, right knee: Secondary | ICD-10-CM | POA: Diagnosis present

## 2018-07-30 DIAGNOSIS — K5900 Constipation, unspecified: Secondary | ICD-10-CM | POA: Diagnosis not present

## 2018-07-30 DIAGNOSIS — E785 Hyperlipidemia, unspecified: Secondary | ICD-10-CM | POA: Diagnosis present

## 2018-07-30 DIAGNOSIS — Z961 Presence of intraocular lens: Secondary | ICD-10-CM | POA: Diagnosis present

## 2018-07-30 DIAGNOSIS — Z79890 Hormone replacement therapy (postmenopausal): Secondary | ICD-10-CM

## 2018-07-30 DIAGNOSIS — T8484XA Pain due to internal orthopedic prosthetic devices, implants and grafts, initial encounter: Principal | ICD-10-CM | POA: Diagnosis present

## 2018-07-30 DIAGNOSIS — H9319 Tinnitus, unspecified ear: Secondary | ICD-10-CM | POA: Diagnosis present

## 2018-07-30 DIAGNOSIS — Z96652 Presence of left artificial knee joint: Secondary | ICD-10-CM | POA: Diagnosis present

## 2018-07-30 DIAGNOSIS — I10 Essential (primary) hypertension: Secondary | ICD-10-CM | POA: Diagnosis present

## 2018-07-30 DIAGNOSIS — E119 Type 2 diabetes mellitus without complications: Secondary | ICD-10-CM | POA: Diagnosis present

## 2018-07-30 DIAGNOSIS — Z7982 Long term (current) use of aspirin: Secondary | ICD-10-CM | POA: Diagnosis not present

## 2018-07-30 DIAGNOSIS — K219 Gastro-esophageal reflux disease without esophagitis: Secondary | ICD-10-CM | POA: Diagnosis present

## 2018-07-30 DIAGNOSIS — K7460 Unspecified cirrhosis of liver: Secondary | ICD-10-CM | POA: Diagnosis present

## 2018-07-30 DIAGNOSIS — Z881 Allergy status to other antibiotic agents status: Secondary | ICD-10-CM

## 2018-07-30 DIAGNOSIS — Z7984 Long term (current) use of oral hypoglycemic drugs: Secondary | ICD-10-CM

## 2018-07-30 DIAGNOSIS — H9190 Unspecified hearing loss, unspecified ear: Secondary | ICD-10-CM | POA: Diagnosis present

## 2018-07-30 DIAGNOSIS — Z96651 Presence of right artificial knee joint: Secondary | ICD-10-CM

## 2018-07-30 DIAGNOSIS — Z79899 Other long term (current) drug therapy: Secondary | ICD-10-CM

## 2018-07-30 DIAGNOSIS — Z9842 Cataract extraction status, left eye: Secondary | ICD-10-CM | POA: Diagnosis not present

## 2018-07-30 DIAGNOSIS — N400 Enlarged prostate without lower urinary tract symptoms: Secondary | ICD-10-CM | POA: Diagnosis present

## 2018-07-30 DIAGNOSIS — Z471 Aftercare following joint replacement surgery: Secondary | ICD-10-CM | POA: Diagnosis not present

## 2018-07-30 DIAGNOSIS — D649 Anemia, unspecified: Secondary | ICD-10-CM | POA: Diagnosis not present

## 2018-07-30 DIAGNOSIS — M6281 Muscle weakness (generalized): Secondary | ICD-10-CM | POA: Diagnosis not present

## 2018-07-30 DIAGNOSIS — K760 Fatty (change of) liver, not elsewhere classified: Secondary | ICD-10-CM | POA: Diagnosis present

## 2018-07-30 DIAGNOSIS — D62 Acute posthemorrhagic anemia: Secondary | ICD-10-CM | POA: Diagnosis not present

## 2018-07-30 DIAGNOSIS — T8484XD Pain due to internal orthopedic prosthetic devices, implants and grafts, subsequent encounter: Secondary | ICD-10-CM | POA: Diagnosis not present

## 2018-07-30 DIAGNOSIS — M1711 Unilateral primary osteoarthritis, right knee: Secondary | ICD-10-CM | POA: Diagnosis present

## 2018-07-30 DIAGNOSIS — F329 Major depressive disorder, single episode, unspecified: Secondary | ICD-10-CM | POA: Diagnosis present

## 2018-07-30 DIAGNOSIS — Z973 Presence of spectacles and contact lenses: Secondary | ICD-10-CM

## 2018-07-30 DIAGNOSIS — R609 Edema, unspecified: Secondary | ICD-10-CM | POA: Diagnosis not present

## 2018-07-30 DIAGNOSIS — R262 Difficulty in walking, not elsewhere classified: Secondary | ICD-10-CM | POA: Diagnosis not present

## 2018-07-30 DIAGNOSIS — Z8719 Personal history of other diseases of the digestive system: Secondary | ICD-10-CM | POA: Diagnosis not present

## 2018-07-30 DIAGNOSIS — M62838 Other muscle spasm: Secondary | ICD-10-CM | POA: Diagnosis not present

## 2018-07-30 DIAGNOSIS — G8918 Other acute postprocedural pain: Secondary | ICD-10-CM | POA: Diagnosis not present

## 2018-07-30 DIAGNOSIS — Z9841 Cataract extraction status, right eye: Secondary | ICD-10-CM

## 2018-07-30 DIAGNOSIS — I4510 Unspecified right bundle-branch block: Secondary | ICD-10-CM | POA: Diagnosis present

## 2018-07-30 DIAGNOSIS — H538 Other visual disturbances: Secondary | ICD-10-CM | POA: Diagnosis not present

## 2018-07-30 DIAGNOSIS — F339 Major depressive disorder, recurrent, unspecified: Secondary | ICD-10-CM | POA: Diagnosis not present

## 2018-07-30 DIAGNOSIS — F419 Anxiety disorder, unspecified: Secondary | ICD-10-CM | POA: Diagnosis present

## 2018-07-30 DIAGNOSIS — Y838 Other surgical procedures as the cause of abnormal reaction of the patient, or of later complication, without mention of misadventure at the time of the procedure: Secondary | ICD-10-CM | POA: Diagnosis present

## 2018-07-30 DIAGNOSIS — Z87891 Personal history of nicotine dependence: Secondary | ICD-10-CM

## 2018-07-30 DIAGNOSIS — Z9079 Acquired absence of other genital organ(s): Secondary | ICD-10-CM | POA: Diagnosis not present

## 2018-07-30 DIAGNOSIS — Z91048 Other nonmedicinal substance allergy status: Secondary | ICD-10-CM

## 2018-07-30 DIAGNOSIS — D6959 Other secondary thrombocytopenia: Secondary | ICD-10-CM | POA: Diagnosis present

## 2018-07-30 DIAGNOSIS — Z888 Allergy status to other drugs, medicaments and biological substances status: Secondary | ICD-10-CM

## 2018-07-30 DIAGNOSIS — G4733 Obstructive sleep apnea (adult) (pediatric): Secondary | ICD-10-CM | POA: Diagnosis present

## 2018-07-30 DIAGNOSIS — T84062A Wear of articular bearing surface of internal prosthetic right knee joint, initial encounter: Secondary | ICD-10-CM | POA: Diagnosis not present

## 2018-07-30 DIAGNOSIS — K7581 Nonalcoholic steatohepatitis (NASH): Secondary | ICD-10-CM | POA: Diagnosis not present

## 2018-07-30 HISTORY — PX: TOTAL KNEE REVISION: SHX996

## 2018-07-30 LAB — TYPE AND SCREEN
ABO/RH(D): A POS
Antibody Screen: NEGATIVE

## 2018-07-30 LAB — BASIC METABOLIC PANEL
Anion gap: 11 (ref 5–15)
BUN: 18 mg/dL (ref 8–23)
CO2: 26 mmol/L (ref 22–32)
Calcium: 8.7 mg/dL — ABNORMAL LOW (ref 8.9–10.3)
Chloride: 105 mmol/L (ref 98–111)
Creatinine, Ser: 1.31 mg/dL — ABNORMAL HIGH (ref 0.61–1.24)
GFR calc Af Amer: 58 mL/min — ABNORMAL LOW (ref >60)
GFR calc non Af Amer: 50 mL/min — ABNORMAL LOW (ref >60)
Glucose, Bld: 152 mg/dL — ABNORMAL HIGH (ref 70–99)
Potassium: 3.7 mmol/L (ref 3.5–5.1)
Sodium: 142 mmol/L (ref 135–145)

## 2018-07-30 LAB — GLUCOSE, CAPILLARY
Glucose-Capillary: 140 mg/dL — ABNORMAL HIGH (ref 70–99)
Glucose-Capillary: 126 mg/dL — ABNORMAL HIGH (ref 70–99)
Glucose-Capillary: 209 mg/dL — ABNORMAL HIGH (ref 70–99)
Glucose-Capillary: 283 mg/dL — ABNORMAL HIGH (ref 70–99)

## 2018-07-30 LAB — HEMOGLOBIN A1C
Hgb A1c MFr Bld: 6.6 % — ABNORMAL HIGH (ref 4.8–5.6)
Mean Plasma Glucose: 142.72 mg/dL

## 2018-07-30 SURGERY — TOTAL KNEE REVISION
Anesthesia: Regional | Site: Knee | Laterality: Right

## 2018-07-30 MED ORDER — FLEET ENEMA 7-19 GM/118ML RE ENEM: 1.0000 | ENEMA | Freq: Once | RECTAL | Status: DC | PRN

## 2018-07-30 MED ORDER — FENTANYL CITRATE (PF) 100 MCG/2ML IJ SOLN
INTRAMUSCULAR | Status: DC | PRN
  Administered 2018-07-30 (×5): 25 ug via INTRAVENOUS

## 2018-07-30 MED ORDER — MIRTAZAPINE 15 MG PO TABS
15.0000 mg | ORAL_TABLET | Freq: Every day | ORAL | Status: DC
  Administered 2018-07-30 – 2018-08-01 (×3): 15 mg via ORAL
  Filled 2018-07-30 (×3): qty 1

## 2018-07-30 MED ORDER — PANTOPRAZOLE SODIUM 40 MG PO TBEC
40.0000 mg | DELAYED_RELEASE_TABLET | Freq: Every day | ORAL | Status: DC
  Administered 2018-07-30 – 2018-08-02 (×4): 40 mg via ORAL
  Filled 2018-07-30 (×4): qty 1

## 2018-07-30 MED ORDER — CHLORHEXIDINE GLUCONATE 4 % EX LIQD: 60.0000 mL | Freq: Once | CUTANEOUS | Status: DC

## 2018-07-30 MED ORDER — KCL IN DEXTROSE-NACL 20-5-0.45 MEQ/L-%-% IV SOLN
INTRAVENOUS | Status: DC
  Administered 2018-07-30 – 2018-07-31 (×2): via INTRAVENOUS
  Filled 2018-07-30 (×3): qty 1000

## 2018-07-30 MED ORDER — MENTHOL 3 MG MT LOZG
1.0000 | LOZENGE | OROMUCOSAL | Status: DC | PRN
  Administered 2018-07-31: 3 mg via ORAL
  Filled 2018-07-30: qty 9

## 2018-07-30 MED ORDER — MELATONIN 3 MG PO TABS
3.0000 mg | ORAL_TABLET | Freq: Every day | ORAL | Status: DC
  Administered 2018-07-30 – 2018-08-01 (×3): 3 mg via ORAL
  Filled 2018-07-30 (×2): qty 1
  Filled 2018-07-30 (×2): qty 3

## 2018-07-30 MED ORDER — BUPIVACAINE LIPOSOME 1.3 % IJ SUSP
INTRAMUSCULAR | Status: DC | PRN
  Administered 2018-07-30: 20 mL

## 2018-07-30 MED ORDER — TIZANIDINE HCL 2 MG PO TABS: 2.0000 mg | ORAL_TABLET | Freq: Four times a day (QID) | ORAL | 0 refills | Status: DC | PRN

## 2018-07-30 MED ORDER — METHOCARBAMOL 500 MG PO TABS
500.0000 mg | ORAL_TABLET | Freq: Four times a day (QID) | ORAL | Status: DC | PRN
  Administered 2018-07-31 – 2018-08-01 (×2): 500 mg via ORAL
  Filled 2018-07-30 (×2): qty 1

## 2018-07-30 MED ORDER — ALUM & MAG HYDROXIDE-SIMETH 200-200-20 MG/5ML PO SUSP: 30.0000 mL | ORAL | Status: DC | PRN

## 2018-07-30 MED ORDER — SODIUM CHLORIDE 0.9 % IR SOLN
Status: DC | PRN
  Administered 2018-07-30: 1000 mL

## 2018-07-30 MED ORDER — ROCURONIUM BROMIDE 100 MG/10ML IV SOLN
INTRAVENOUS | Status: AC
  Filled 2018-07-30: qty 1

## 2018-07-30 MED ORDER — TRANEXAMIC ACID 1000 MG/10ML IV SOLN
INTRAVENOUS | Status: DC | PRN
  Administered 2018-07-30: 2000 mg via TOPICAL

## 2018-07-30 MED ORDER — ASPIRIN EC 81 MG PO TBEC: 81.0000 mg | DELAYED_RELEASE_TABLET | Freq: Two times a day (BID) | ORAL | 0 refills | Status: DC

## 2018-07-30 MED ORDER — METHOCARBAMOL 500 MG IVPB - SIMPLE MED
500.0000 mg | Freq: Four times a day (QID) | INTRAVENOUS | Status: DC | PRN
  Administered 2018-07-30: 500 mg via INTRAVENOUS
  Filled 2018-07-30: qty 50

## 2018-07-30 MED ORDER — HYDROMORPHONE HCL 1 MG/ML IJ SOLN: 0.5000 mg | INTRAMUSCULAR | Status: DC | PRN

## 2018-07-30 MED ORDER — PROPOFOL 10 MG/ML IV BOLUS
INTRAVENOUS | Status: DC | PRN
  Administered 2018-07-30: 240 mg via INTRAVENOUS

## 2018-07-30 MED ORDER — ACETAMINOPHEN 325 MG PO TABS: 325.0000 mg | ORAL_TABLET | Freq: Four times a day (QID) | ORAL | Status: DC | PRN

## 2018-07-30 MED ORDER — FENTANYL CITRATE (PF) 100 MCG/2ML IJ SOLN
INTRAMUSCULAR | Status: AC
  Filled 2018-07-30: qty 2

## 2018-07-30 MED ORDER — ONDANSETRON HCL 4 MG PO TABS: 4.0000 mg | ORAL_TABLET | Freq: Four times a day (QID) | ORAL | Status: DC | PRN

## 2018-07-30 MED ORDER — FUROSEMIDE 40 MG PO TABS
40.0000 mg | ORAL_TABLET | Freq: Every day | ORAL | Status: DC
  Administered 2018-07-30 – 2018-08-01 (×3): 40 mg via ORAL
  Filled 2018-07-30 (×3): qty 1

## 2018-07-30 MED ORDER — ROPIVACAINE HCL 5 MG/ML IJ SOLN
INTRAMUSCULAR | Status: DC | PRN
  Administered 2018-07-30: 30 mL via PERINEURAL

## 2018-07-30 MED ORDER — FINASTERIDE 5 MG PO TABS
5.0000 mg | ORAL_TABLET | Freq: Every morning | ORAL | Status: DC
  Administered 2018-07-31 – 2018-08-02 (×3): 5 mg via ORAL
  Filled 2018-07-30 (×3): qty 1

## 2018-07-30 MED ORDER — LORATADINE 10 MG PO TABS
10.0000 mg | ORAL_TABLET | Freq: Every day | ORAL | Status: DC
  Administered 2018-07-30 – 2018-08-02 (×4): 10 mg via ORAL
  Filled 2018-07-30 (×4): qty 1

## 2018-07-30 MED ORDER — ONDANSETRON HCL 4 MG/2ML IJ SOLN: 4.0000 mg | Freq: Four times a day (QID) | INTRAMUSCULAR | Status: DC | PRN

## 2018-07-30 MED ORDER — OXYCODONE HCL 5 MG PO TABS
5.0000 mg | ORAL_TABLET | ORAL | Status: DC | PRN
  Administered 2018-07-30 – 2018-08-02 (×6): 5 mg via ORAL
  Filled 2018-07-30 (×6): qty 1

## 2018-07-30 MED ORDER — EPHEDRINE 5 MG/ML INJ
INTRAVENOUS | Status: AC
  Filled 2018-07-30: qty 10

## 2018-07-30 MED ORDER — MIDAZOLAM HCL 2 MG/2ML IJ SOLN: 1.0000 mg | INTRAMUSCULAR | Status: DC

## 2018-07-30 MED ORDER — FENTANYL CITRATE (PF) 100 MCG/2ML IJ SOLN
50.0000 ug | INTRAMUSCULAR | Status: DC
  Administered 2018-07-30: 50 ug via INTRAVENOUS

## 2018-07-30 MED ORDER — SUGAMMADEX SODIUM 200 MG/2ML IV SOLN
INTRAVENOUS | Status: DC | PRN
  Administered 2018-07-30: 250 mg via INTRAVENOUS

## 2018-07-30 MED ORDER — ONDANSETRON HCL 4 MG/2ML IJ SOLN
INTRAMUSCULAR | Status: DC | PRN
  Administered 2018-07-30: 4 mg via INTRAVENOUS

## 2018-07-30 MED ORDER — LIDOCAINE 2% (20 MG/ML) 5 ML SYRINGE
INTRAMUSCULAR | Status: DC | PRN
  Administered 2018-07-30: 60 mg via INTRAVENOUS

## 2018-07-30 MED ORDER — DEXAMETHASONE SODIUM PHOSPHATE 10 MG/ML IJ SOLN
INTRAMUSCULAR | Status: DC | PRN
  Administered 2018-07-30: 10 mg via INTRAVENOUS

## 2018-07-30 MED ORDER — GABAPENTIN 300 MG PO CAPS
300.0000 mg | ORAL_CAPSULE | Freq: Three times a day (TID) | ORAL | Status: DC
  Administered 2018-07-30 – 2018-08-02 (×9): 300 mg via ORAL
  Filled 2018-07-30 (×9): qty 1

## 2018-07-30 MED ORDER — METFORMIN HCL 500 MG PO TABS
500.0000 mg | ORAL_TABLET | Freq: Every day | ORAL | Status: DC
  Administered 2018-07-31 – 2018-08-02 (×3): 500 mg via ORAL
  Filled 2018-07-30 (×3): qty 1

## 2018-07-30 MED ORDER — EPHEDRINE SULFATE-NACL 50-0.9 MG/10ML-% IV SOSY
PREFILLED_SYRINGE | INTRAVENOUS | Status: DC | PRN
  Administered 2018-07-30 (×2): 5 mg via INTRAVENOUS
  Administered 2018-07-30: 10 mg via INTRAVENOUS
  Administered 2018-07-30 (×3): 5 mg via INTRAVENOUS

## 2018-07-30 MED ORDER — NADOLOL 40 MG PO TABS
40.0000 mg | ORAL_TABLET | Freq: Every day | ORAL | Status: DC
  Administered 2018-07-30 – 2018-08-01 (×3): 40 mg via ORAL
  Filled 2018-07-30 (×3): qty 1

## 2018-07-30 MED ORDER — SODIUM CHLORIDE 0.9 % IJ SOLN
INTRAMUSCULAR | Status: DC | PRN
  Administered 2018-07-30: 50 mL

## 2018-07-30 MED ORDER — INSULIN ASPART 100 UNIT/ML ~~LOC~~ SOLN
0.0000 [IU] | Freq: Three times a day (TID) | SUBCUTANEOUS | Status: DC
  Administered 2018-07-30: 5 [IU] via SUBCUTANEOUS
  Administered 2018-07-31: 3 [IU] via SUBCUTANEOUS
  Administered 2018-07-31: 5 [IU] via SUBCUTANEOUS
  Administered 2018-07-31: 3 [IU] via SUBCUTANEOUS
  Administered 2018-08-01 – 2018-08-02 (×3): 2 [IU] via SUBCUTANEOUS

## 2018-07-30 MED ORDER — OXYCODONE-ACETAMINOPHEN 5-325 MG PO TABS: 1.0000 | ORAL_TABLET | ORAL | 0 refills | Status: DC | PRN

## 2018-07-30 MED ORDER — SUCCINYLCHOLINE CHLORIDE 200 MG/10ML IV SOSY
PREFILLED_SYRINGE | INTRAVENOUS | Status: AC
  Filled 2018-07-30: qty 10

## 2018-07-30 MED ORDER — FENTANYL CITRATE (PF) 100 MCG/2ML IJ SOLN: 25.0000 ug | INTRAMUSCULAR | Status: DC | PRN

## 2018-07-30 MED ORDER — PHENOL 1.4 % MT LIQD
1.0000 | OROMUCOSAL | Status: DC | PRN
  Filled 2018-07-30: qty 177

## 2018-07-30 MED ORDER — LIDOCAINE 2% (20 MG/ML) 5 ML SYRINGE
INTRAMUSCULAR | Status: AC
  Filled 2018-07-30: qty 5

## 2018-07-30 MED ORDER — TRANEXAMIC ACID-NACL 1000-0.7 MG/100ML-% IV SOLN
1000.0000 mg | INTRAVENOUS | Status: AC
  Administered 2018-07-30: 1000 mg via INTRAVENOUS
  Filled 2018-07-30: qty 100

## 2018-07-30 MED ORDER — FENTANYL CITRATE (PF) 100 MCG/2ML IJ SOLN
INTRAMUSCULAR | Status: AC
  Filled 2018-07-30: qty 4

## 2018-07-30 MED ORDER — FENTANYL CITRATE (PF) 100 MCG/2ML IJ SOLN
INTRAMUSCULAR | Status: AC
  Administered 2018-07-30: 50 ug via INTRAVENOUS
  Filled 2018-07-30: qty 2

## 2018-07-30 MED ORDER — BUPIVACAINE LIPOSOME 1.3 % IJ SUSP
20.0000 mL | Freq: Once | INTRAMUSCULAR | Status: DC
  Filled 2018-07-30: qty 20

## 2018-07-30 MED ORDER — BUPIVACAINE-EPINEPHRINE 0.5% -1:200000 IJ SOLN
INTRAMUSCULAR | Status: DC | PRN
  Administered 2018-07-30: 20 mL

## 2018-07-30 MED ORDER — POLYETHYLENE GLYCOL 3350 17 G PO PACK
17.0000 g | PACK | Freq: Every day | ORAL | Status: DC | PRN
  Administered 2018-08-01: 17 g via ORAL
  Filled 2018-07-30: qty 1

## 2018-07-30 MED ORDER — SUCCINYLCHOLINE CHLORIDE 200 MG/10ML IV SOSY
PREFILLED_SYRINGE | INTRAVENOUS | Status: DC | PRN
  Administered 2018-07-30: 100 mg via INTRAVENOUS

## 2018-07-30 MED ORDER — METOCLOPRAMIDE HCL 5 MG PO TABS: 5.0000 mg | ORAL_TABLET | Freq: Three times a day (TID) | ORAL | Status: DC | PRN

## 2018-07-30 MED ORDER — BISACODYL 5 MG PO TBEC: 5.0000 mg | DELAYED_RELEASE_TABLET | Freq: Every day | ORAL | Status: DC | PRN

## 2018-07-30 MED ORDER — DOCUSATE SODIUM 100 MG PO CAPS
100.0000 mg | ORAL_CAPSULE | Freq: Two times a day (BID) | ORAL | Status: DC
  Administered 2018-07-30 – 2018-08-02 (×6): 100 mg via ORAL
  Filled 2018-07-30 (×6): qty 1

## 2018-07-30 MED ORDER — SUGAMMADEX SODIUM 500 MG/5ML IV SOLN
INTRAVENOUS | Status: AC
  Filled 2018-07-30: qty 5

## 2018-07-30 MED ORDER — ONDANSETRON HCL 4 MG/2ML IJ SOLN: 4.0000 mg | Freq: Once | INTRAMUSCULAR | Status: DC | PRN

## 2018-07-30 MED ORDER — DIPHENHYDRAMINE HCL 12.5 MG/5ML PO ELIX: 12.5000 mg | ORAL_SOLUTION | ORAL | Status: DC | PRN

## 2018-07-30 MED ORDER — LISINOPRIL 10 MG PO TABS
10.0000 mg | ORAL_TABLET | Freq: Every morning | ORAL | Status: DC
  Administered 2018-07-30 – 2018-08-02 (×3): 10 mg via ORAL
  Filled 2018-07-30 (×4): qty 1

## 2018-07-30 MED ORDER — TRANEXAMIC ACID-NACL 1000-0.7 MG/100ML-% IV SOLN
1000.0000 mg | Freq: Once | INTRAVENOUS | Status: AC
  Administered 2018-07-30: 1000 mg via INTRAVENOUS
  Filled 2018-07-30: qty 100

## 2018-07-30 MED ORDER — ASPIRIN 81 MG PO CHEW
81.0000 mg | CHEWABLE_TABLET | Freq: Two times a day (BID) | ORAL | Status: DC
  Administered 2018-07-30: 81 mg via ORAL
  Filled 2018-07-30: qty 1

## 2018-07-30 MED ORDER — METOCLOPRAMIDE HCL 5 MG/ML IJ SOLN: 5.0000 mg | Freq: Three times a day (TID) | INTRAMUSCULAR | Status: DC | PRN

## 2018-07-30 MED ORDER — SODIUM CHLORIDE 0.9 % IJ SOLN
INTRAMUSCULAR | Status: AC
  Filled 2018-07-30: qty 50

## 2018-07-30 MED ORDER — CYCLOSPORINE 0.05 % OP EMUL
1.0000 [drp] | Freq: Two times a day (BID) | OPHTHALMIC | Status: DC | PRN
  Administered 2018-08-01 (×2): 1 [drp] via OPHTHALMIC
  Filled 2018-07-30 (×4): qty 1

## 2018-07-30 MED ORDER — CITALOPRAM HYDROBROMIDE 20 MG PO TABS
20.0000 mg | ORAL_TABLET | Freq: Every day | ORAL | Status: DC
  Administered 2018-07-31 – 2018-08-01 (×2): 20 mg via ORAL
  Filled 2018-07-30 (×2): qty 1

## 2018-07-30 MED ORDER — SODIUM CHLORIDE 0.9 % IR SOLN
Status: DC | PRN
  Administered 2018-07-30: 3000 mL

## 2018-07-30 MED ORDER — METHOCARBAMOL 500 MG IVPB - SIMPLE MED
INTRAVENOUS | Status: AC
  Filled 2018-07-30: qty 50

## 2018-07-30 MED ORDER — ARTIFICIAL TEARS OPHTHALMIC OINT
TOPICAL_OINTMENT | OPHTHALMIC | Status: AC
  Filled 2018-07-30: qty 3.5

## 2018-07-30 MED ORDER — STERILE WATER FOR IRRIGATION IR SOLN
Status: DC | PRN
  Administered 2018-07-30: 2000 mL

## 2018-07-30 MED ORDER — ROCURONIUM BROMIDE 10 MG/ML (PF) SYRINGE
PREFILLED_SYRINGE | INTRAVENOUS | Status: DC | PRN
  Administered 2018-07-30: 20 mg via INTRAVENOUS

## 2018-07-30 MED ORDER — TRANEXAMIC ACID-NACL 1000-0.7 MG/100ML-% IV SOLN
1000.0000 mg | INTRAVENOUS | Status: DC
  Filled 2018-07-30: qty 100

## 2018-07-30 MED ORDER — BUPIVACAINE-EPINEPHRINE 0.5% -1:200000 IJ SOLN
INTRAMUSCULAR | Status: AC
  Filled 2018-07-30: qty 1

## 2018-07-30 MED ORDER — LACTATED RINGERS IV SOLN
INTRAVENOUS | Status: DC
  Administered 2018-07-30: 10:00:00 via INTRAVENOUS

## 2018-07-30 SURGICAL SUPPLY — 51 items
BAG DECANTER FOR FLEXI CONT (MISCELLANEOUS) ×3 IMPLANT
BAG SPEC THK2 15X12 ZIP CLS (MISCELLANEOUS) ×1
BAG ZIPLOCK 12X15 (MISCELLANEOUS) ×3 IMPLANT
BANDAGE ACE 6X5 VEL STRL LF (GAUZE/BANDAGES/DRESSINGS) ×3 IMPLANT
BANDAGE ELASTIC 6 VELCRO ST LF (GAUZE/BANDAGES/DRESSINGS) ×3 IMPLANT
BLADE SAG 18X100X1.27 (BLADE) ×3 IMPLANT
BOWL SMART MIX CTS (DISPOSABLE) ×3 IMPLANT
BRUSH FEMORAL CANAL (MISCELLANEOUS) ×3 IMPLANT
CEMENT HV SMART SET (Cement) ×3 IMPLANT
COVER SURGICAL LIGHT HANDLE (MISCELLANEOUS) ×3 IMPLANT
COVER WAND RF STERILE (DRAPES) ×3 IMPLANT
CUFF TOURN SGL QUICK 34 (TOURNIQUET CUFF) ×3
CUFF TRNQT CYL 34X4X40X1 (TOURNIQUET CUFF) ×1 IMPLANT
DECANTER SPIKE VIAL GLASS SM (MISCELLANEOUS) ×6 IMPLANT
DRAPE ORTHO SPLIT 77X108 STRL (DRAPES) ×2
DRAPE SURG ORHT 6 SPLT 77X108 (DRAPES) ×1 IMPLANT
DRAPE U-SHAPE 47X51 STRL (DRAPES) ×3 IMPLANT
DRESSING AQUACEL AG SP 3.5X10 (GAUZE/BANDAGES/DRESSINGS) ×1 IMPLANT
DRSG AQUACEL AG SP 3.5X10 (GAUZE/BANDAGES/DRESSINGS) ×3
DURAPREP 26ML APPLICATOR (WOUND CARE) ×3 IMPLANT
ELECT REM PT RETURN 15FT ADLT (MISCELLANEOUS) ×3 IMPLANT
GLOVE BIO SURGEON STRL SZ7.5 (GLOVE) ×3 IMPLANT
GLOVE BIO SURGEON STRL SZ8.5 (GLOVE) ×3 IMPLANT
GLOVE BIOGEL PI IND STRL 7.5 (GLOVE) ×4 IMPLANT
GLOVE BIOGEL PI IND STRL 8 (GLOVE) ×3 IMPLANT
GLOVE BIOGEL PI IND STRL 9 (GLOVE) ×1 IMPLANT
GLOVE BIOGEL PI INDICATOR 7.5 (GLOVE) ×8
GLOVE BIOGEL PI INDICATOR 8 (GLOVE) ×6
GLOVE BIOGEL PI INDICATOR 9 (GLOVE) ×2
GOWN STRL REUS W/ TWL XL LVL3 (GOWN DISPOSABLE) ×1 IMPLANT
GOWN STRL REUS W/TWL XL LVL3 (GOWN DISPOSABLE) ×12 IMPLANT
HANDPIECE INTERPULSE COAX TIP (DISPOSABLE) ×3
HOOD PEEL AWAY FLYTE STAYCOOL (MISCELLANEOUS) ×9 IMPLANT
IMMOBILIZER KNEE 20 (SOFTGOODS) ×3
IMMOBILIZER KNEE 20 THIGH 36 (SOFTGOODS) ×1 IMPLANT
NEEDLE HYPO 21X1.5 SAFETY (NEEDLE) ×6 IMPLANT
PACK TOTAL KNEE CUSTOM (KITS) ×3 IMPLANT
PATELLA DOME PFC 38MM (Knees) ×3 IMPLANT
PLATE ROT INSERT 12.5MM SIZE 4 (Plate) ×3 IMPLANT
POSITIONER SURGICAL ARM (MISCELLANEOUS) ×3 IMPLANT
SET HNDPC FAN SPRY TIP SCT (DISPOSABLE) ×1 IMPLANT
SUT VIC AB 1 CTX 36 (SUTURE) ×3
SUT VIC AB 1 CTX36XBRD ANBCTR (SUTURE) ×1 IMPLANT
SUT VIC AB 2-0 CT1 27 (SUTURE) ×3
SUT VIC AB 2-0 CT1 TAPERPNT 27 (SUTURE) ×1 IMPLANT
SUT VIC AB 3-0 CT1 27 (SUTURE) ×3
SUT VIC AB 3-0 CT1 TAPERPNT 27 (SUTURE) ×1 IMPLANT
SYR CONTROL 10ML LL (SYRINGE) ×6 IMPLANT
TRAY FOLEY MTR SLVR 16FR STAT (SET/KITS/TRAYS/PACK) ×3 IMPLANT
WRAP KNEE MAXI GEL POST OP (GAUZE/BANDAGES/DRESSINGS) ×3 IMPLANT
YANKAUER SUCT BULB TIP 10FT TU (MISCELLANEOUS) ×3 IMPLANT

## 2018-07-30 NOTE — Anesthesia Preprocedure Evaluation (Addendum)
Anesthesia Evaluation  Patient identified by MRN, date of birth, ID band Patient awake    Reviewed: Allergy & Precautions, NPO status , Patient's Chart, lab work & pertinent test results, reviewed documented beta blocker date and time   Airway Mallampati: III  TM Distance: >3 FB Neck ROM: Full    Dental no notable dental hx.    Pulmonary sleep apnea , former smoker,    Pulmonary exam normal breath sounds clear to auscultation       Cardiovascular hypertension, Pt. on medications and Pt. on home beta blockers Normal cardiovascular exam Rhythm:Regular Rate:Normal  ECG: rate 56. Sinus bradycardia with 1st degree A-V block. Right bundle branch block   Neuro/Psych PSYCHIATRIC DISORDERS Anxiety Depression negative neurological ROS     GI/Hepatic negative GI ROS, (+) Cirrhosis   Esophageal Varices    , Non-alcoholic fatty liver   Endo/Other  diabetes, Oral Hypoglycemic Agents  Renal/GU negative Renal ROS     Musculoskeletal  (+) Arthritis ,   Abdominal   Peds  Hematology HLD   Anesthesia Other Findings PAINFUL RIGHT KNEE ARTHROPLASTY/PATELLA  Reproductive/Obstetrics                           Anesthesia Physical Anesthesia Plan  ASA: IV  Anesthesia Plan: General and Regional   Post-op Pain Management: GA combined w/ Regional for post-op pain   Induction: Intravenous  PONV Risk Score and Plan: 2 and Ondansetron, Dexamethasone and Treatment may vary due to age or medical condition  Airway Management Planned: Oral ETT  Additional Equipment:   Intra-op Plan:   Post-operative Plan: Extubation in OR  Informed Consent: I have reviewed the patients History and Physical, chart, labs and discussed the procedure including the risks, benefits and alternatives for the proposed anesthesia with the patient or authorized representative who has indicated his/her understanding and acceptance.   Dental  advisory given  Plan Discussed with: CRNA  Anesthesia Plan Comments:       Anesthesia Quick Evaluation

## 2018-07-30 NOTE — Anesthesia Procedure Notes (Signed)
Anesthesia Regional Block: Adductor canal block   Pre-Anesthetic Checklist: ,, timeout performed, Correct Patient, Correct Site, Correct Laterality, Correct Procedure,, site marked, risks and benefits discussed, Surgical consent,  Pre-op evaluation,  At surgeon's request and post-op pain management  Laterality: Right  Prep: chloraprep       Needles:  Injection technique: Single-shot  Needle Type: Echogenic Stimulator Needle     Needle Length: 9cm  Needle Gauge: 21     Additional Needles:   Procedures:,,,, ultrasound used (permanent image in chart),,,,  Narrative:  Start time: 07/30/2018 10:20 AM End time: 07/30/2018 10:30 AM Injection made incrementally with aspirations every 5 mL.  Performed by: Personally  Anesthesiologist: Murvin Natal, MD  Additional Notes: Functioning IV was confirmed and monitors were applied. A time-out was performed. Hand hygiene and sterile gloves were used. The thigh was placed in a frog-leg position and prepped in a sterile fashion. A 36mm 21ga Arrow echogenic stimulator needle was placed using ultrasound guidance.  Negative aspiration and negative test dose prior to incremental administration of local anesthetic. The patient tolerated the procedure well.

## 2018-07-30 NOTE — Anesthesia Procedure Notes (Signed)
Procedure Name: Intubation Date/Time: 07/30/2018 10:54 AM Performed by: Victoriano Lain, CRNA Pre-anesthesia Checklist: Patient identified, Emergency Drugs available, Suction available, Patient being monitored and Timeout performed Patient Re-evaluated:Patient Re-evaluated prior to induction Oxygen Delivery Method: Circle system utilized Preoxygenation: Pre-oxygenation with 100% oxygen Induction Type: IV induction Ventilation: Mask ventilation without difficulty Laryngoscope Size: Glidescope and 4 Grade View: Grade I Tube type: Glide rite Tube size: 7.5 mm Number of attempts: 2 Airway Equipment and Method: Stylet and Video-laryngoscopy Placement Confirmation: ETT inserted through vocal cords under direct vision,  positive ETCO2 and breath sounds checked- equal and bilateral Secured at: 22 cm Tube secured with: Tape Dental Injury: Teeth and Oropharynx as per pre-operative assessment  Difficulty Due To: Difficult Airway- due to large tongue and Difficulty was anticipated Comments: Easy mask. DL X 1 with MAC 4. Grade 3-4 view. Masked with 100% O2. VSS. DL X 1 with Glidescope #4. Grade 1 view with Glidescope. + ETCO2. BBS =. ATOI. ETT secured at 22 cm at the lip.

## 2018-07-30 NOTE — Discharge Instructions (Signed)

## 2018-07-30 NOTE — Evaluation (Signed)
Physical Therapy Evaluation Patient Details Name: Jared Martinez MRN: [id number] DOB: May 26, 1941 Today's Date: 07/30/2018   History of Present Illness  77 YO male s/p R TKR revision/patella on 07/30/18. PMH includes nonalcoholic hepatic cirrhosis, DJD, HTN, HLD, anxiety, sleep apnea, depression, 1st degree AV block, LE edema, DMII. Surgical history includes R shoulder arthroscopy, R TKR 2013, L TKR 2006.   Clinical Impression   Pt presents with very mild to no R knee pain, increased time and effort to perform mobility tasks, decreased tolerance for activity, and RLE ROM deficits. Pt also with lightheadedness upon sitting EOB, but BP and pulse WNL (see mobility section below). Pt ambulated short distance with chair follow for safety, given resolution of lightheadedness. PT to continue to follow and will progress mobility as tolerated.     Follow Up Recommendations Follow surgeon's recommendation for DC plan and follow-up therapies;Supervision for mobility/OOB(SNF )    Equipment Recommendations  None recommended by PT    Recommendations for Other Services       Precautions / Restrictions Precautions Precautions: Fall;Knee Restrictions Weight Bearing Restrictions: No Other Position/Activity Restrictions: WBAT       Mobility  Bed Mobility Overal bed mobility: Needs Assistance Bed Mobility: Supine to Sit     Supine to sit: Min guard;HOB elevated     General bed mobility comments: Min guard for safety, increased time and effort. Pt reported feeling lightheaded upon sitting up, BP 144/80 and pulse of 68 bpm. Lightheadedness subsided after 2 minutes sitting EOB.   Transfers Overall transfer level: Needs assistance Equipment used: Rolling walker (2 wheeled) Transfers: Sit to/from Stand Sit to Stand: Min guard;From elevated surface         General transfer comment: Min guard for safety, verbal cuing for hand placement. Pt with strong desire to ambulate with no reports of  lightheadedness. Weight shift L and R without RLE buckling.   Ambulation/Gait Ambulation/Gait assistance: Min guard;+2 safety/equipment Gait Distance (Feet): 25 Feet Assistive device: Rolling walker (2 wheeled) Gait Pattern/deviations: Step-to pattern;Decreased stride length;Trunk flexed Gait velocity: decr    General Gait Details: Pt with no complaints of lightheadedness with ambulation, but PT insisted upon chair follow and short ambulation distance. Verbal cuing for sequencing, placement in RW.  Stairs            Wheelchair Mobility    Modified Rankin (Stroke Patients Only)       Balance Overall balance assessment: Mild deficits observed, not formally tested                                           Pertinent Vitals/Pain Pain Assessment: No/denies pain    Home Living Family/patient expects to be discharged to:: Skilled nursing facility Living Arrangements: Alone                    Prior Function Level of Independence: Independent               Hand Dominance   Dominant Hand: Right    Extremity/Trunk Assessment   Upper Extremity Assessment Upper Extremity Assessment: Overall WFL for tasks assessed    Lower Extremity Assessment Lower Extremity Assessment: Overall WFL for tasks assessed;RLE deficits/detail RLE Deficits / Details: suspected post-surgical weakness; able to perform SLR with no quad lag, LAQ at edge of bed RLE Sensation: WNL(general anesthesia )    Cervical / Trunk Assessment Cervical /  Trunk Assessment: Normal  Communication   Communication: No difficulties  Cognition Arousal/Alertness: Awake/alert Behavior During Therapy: WFL for tasks assessed/performed Overall Cognitive Status: Within Functional Limits for tasks assessed                                        General Comments      Exercises Total Joint Exercises Ankle Circles/Pumps: AROM;Both;5 reps;Seated Goniometric ROM: Knee  AROM 0-95*, limited by pain    Assessment/Plan    PT Assessment Patient needs continued PT services  PT Problem List Decreased strength;Pain;Decreased range of motion;Decreased activity tolerance;Decreased knowledge of use of DME;Decreased balance;Decreased safety awareness;Decreased mobility       PT Treatment Interventions Therapeutic activities;DME instruction;Patient/family education;Therapeutic exercise;Gait training;Balance training;Functional mobility training    PT Goals (Current goals can be found in the Care Plan section)  Acute Rehab PT Goals PT Goal Formulation: With patient Time For Goal Achievement: 08/13/18 Potential to Achieve Goals: Good    Frequency 7X/week   Barriers to discharge        Co-evaluation               AM-PAC PT "6 Clicks" Daily Activity  Outcome Measure Difficulty turning over in bed (including adjusting bedclothes, sheets and blankets)?: Unable Difficulty moving from lying on back to sitting on the side of the bed? : Unable Difficulty sitting down on and standing up from a chair with arms (e.g., wheelchair, bedside commode, etc,.)?: Unable Help needed moving to and from a bed to chair (including a wheelchair)?: A Little Help needed walking in hospital room?: A Little Help needed climbing 3-5 steps with a railing? : A Little 6 Click Score: 12    End of Session Equipment Utilized During Treatment: Gait belt Activity Tolerance: Patient tolerated treatment well Patient left: in chair;with chair alarm set;with call bell/phone within reach;with SCD's reapplied Nurse Communication: Mobility status PT Visit Diagnosis: Other abnormalities of gait and mobility (R26.89);Difficulty in walking, not elsewhere classified (R26.2)    Time: 5366-4403 PT Time Calculation (min) (ACUTE ONLY): 24 min   Charges:   PT Evaluation $PT Eval Low Complexity: 1 Low PT Treatments $Gait Training: 8-22 mins       Julien Girt, PT Acute Rehabilitation  Services Pager [phone]  Office [phone]   Asma Boldon D Elonda Husky 07/30/2018, 7:29 PM

## 2018-07-30 NOTE — Transfer of Care (Signed)
Immediate Anesthesia Transfer of Care Note  Patient: Jared Martinez  Procedure(s) Performed: RIGHT TOTAL KNEE REVISION OF PATELLA AND TBIAL BEARING (Right Knee)  Patient Location: PACU  Anesthesia Type:General  Level of Consciousness: awake, alert , oriented and patient cooperative  Airway & Oxygen Therapy: Patient Spontanous Breathing and Patient connected to face mask oxygen  Post-op Assessment: Report given to RN, Post -op Vital signs reviewed and stable and Patient moving all extremities  Post vital signs: Reviewed and stable  Last Vitals:  Vitals Value Taken Time  BP 146/80 07/30/2018  1:03 PM  Temp    Pulse 74 07/30/2018  1:07 PM  Resp 20 07/30/2018  1:07 PM  SpO2 94 % 07/30/2018  1:07 PM  Vitals shown include unvalidated device data.  Last Pain:  Vitals:   07/30/18 1032  TempSrc:   PainSc: 0-No pain      Patients Stated Pain Goal: 5 (33/35/45 6256)  Complications: No apparent anesthesia complications

## 2018-07-30 NOTE — Progress Notes (Signed)
AssistedDr. Ellender with right, ultrasound guided, adductor canal block. Side rails up, monitors on throughout procedure. See vital signs in flow sheet. Tolerated Procedure well.  

## 2018-07-30 NOTE — Interval H&P Note (Signed)
History and Physical Interval Note:  07/30/2018 10:13 AM  Jared Martinez  has presented today for surgery, with the diagnosis of PAINFUL RIGHT KNEE ARTHROPLASTY/PATELLA  The various methods of treatment have been discussed with the patient and family. After consideration of risks, benefits and other options for treatment, the patient has consented to  Procedure(s): RIGHT TOTAL KNEE REVISION/PATELLA (Right) as a surgical intervention .  The patient's history has been reviewed, patient examined, no change in status, stable for surgery.  I have reviewed the patient's chart and labs.  Questions were answered to the patient's satisfaction.     [name]

## 2018-07-30 NOTE — Op Note (Signed)
PATIENT ID:      Jared Martinez  MRN:     [id number] DOB/AGE:    1941-08-06 / 77 y.o.       OPERATIVE REPORT    DATE OF PROCEDURE:  07/30/2018       PREOPERATIVE DIAGNOSIS:   PAINFUL RIGHT KNEE ARTHROPLASTY/PATELLA impingement     Estimated body mass index is 37.1 kg/m as calculated from the following:   Height as of this encounter: 5\' 11"  (1.803 m).   Weight as of this encounter: 120.7 kg.                                                        POSTOPERATIVE DIAGNOSIS:   PAINFUL RIGHT KNEE ARTHROPLASTY/PATELLA                                                                      PROCEDURE:  Procedure(s): RIGHT TOTAL KNEE REVISION OF PATELLA AND TBIAL BEARING Using Depuy Sigma RP implants , 12.9mm Sigma RP bearing, 38 Patella     SURGEON: [name]    ASSISTANT:   Kerry Hough. [employer]   (Present and scrubbed throughout the case, critical for assistance with exposure, retraction, instrumentation, and closure.)         ANESTHESIA: GET, Exparel  EBL: 200cc  FLUID REPLACEMENT: 1600cc crystalloid TOURNIQUET TIME: 15 min  DRAINS: none  Tranexamic Acid: 1gm  COMPLICATIONS:  None        INDICATIONS FOR PROCEDURE: The patient has  PAINFUL RIGHT KNEE ARTHROPLASTY/PATELLA, varus deformities, XR shows bone on bone arthritis. Patient has failed all conservative measures including anti-inflammatory medicines, narcotics, attempts at  exercise and weight loss, cortisone injections and viscosupplementation.  Risks and benefits of surgery have been discussed, questions answered.   DESCRIPTION OF PROCEDURE: The patient identified by armband, received  IV antibiotics, in the holding area at [facility]. Patient taken to the operating room, appropriate anesthetic monitors were attached, and general endotracheal anesthesia induced with the patient in supine position. Tourniquet applied high to the operative thigh. Lateral post and foot positioner applied to the table, the lower extremity was  then prepped and draped in usual sterile fashion from the ankle to the tourniquet. Time-out procedure was performed.  We began the operation with the knee flexed 100 degrees, by making an anterior midline incision starting at handbreadth above the patella going over the patella 1 cm medial to and 4 cm distal to the tibial tubercle. Small bleeders in the skin and the subcutaneous tissue identified and cauterized. Transverse retinaculum was incised and reflected medially and a medial parapatellar arthrotomy was accomplished. the patella was everted and the prepatellar fat pad resected. The superficial medial collateral ligament was then elevated from anterior to posterior along the proximal flare of the tibia exposing the tibial bearing and tibial baseplate as well as the femoral component.  There is no obvious loosening.  We continued to dissect the superficial MCL around posteriorly.  We resected scar tissue from anterior to the patellar tendon freeing up the patella which was then initially  subluxed laterally and finally everted with a great deal of effort because of the exposed bone on the lateral facet.  The exposed bone measured a centimeter in width half of it was resected with a rondure allowing [place] to finally evert the patella with the knee in extension.  Because of the tightness we went ahead and remove the RP bearing to continue to free up the exposure.  There is no loosening of the tibial baseplate but there were osteophytes overgrowing the baseplate and the was removed with osteotomes and a rondure.  We were finally able to flex the knee up completely with the patella subluxed laterally exposing the femoral component.  We remove soft tissue from the interface between the bone cement and metal and discern that the femoral component was not loose either.  With the patellar part component everted we then used a 1/2 inch wide osteotome and remove the old 35 mm patellar button and then did a freehand cut on the  patella.  We sized for a 38 button based on the AP diameter and trimmed some more bone from the lateral facet to make it flush with the patellar template.  We then drilled the template applied a trial 38 mm button which fit nicely.  We then directed our attention back to the tibial and femoral components remove scar tissue from around the edges.  We lavaged out with normal saline solution and inserted a new 12.5 mm Sigma RP bearing without difficulty.  We then took the knee through range of motion with a new trial button there was no bony impingement.  Once again the trial component was removed the patella was everted we did make some drill holes with a guidepin for better cement fixation.  The exposed bone was then dried with suction and sponges.  A single batch of DePuy HV cement was mixed and applied to the bony and plastic surfaces of the patellar button which was then squeezed into place and clamped.  While the cement hardened we went ahead and applied Exparel throughout the soft tissues using a 10 cc syringe with a 21-gauge needle for a mixture of 1 vial of Exparel 1 vial of quarter percent Marcaine and 50 cc of saline.  We then released the clamp took the knee through a range of motion one more time and irrigated out with pulse lavage.  We then closed in layers with running #1 Vicryl suture in the parapatellar arthrotomy 2-0 and 3-0 Vicryl suture in the subcutaneous and subcuticular tissues followed by a Aquacil dressing.  We then applied an Ace wrap.  The patient was undraped awaken extubated and taken to the recovery room without difficulty.  [name] 07/30/2018, 12:28 PM

## 2018-07-31 ENCOUNTER — Encounter (HOSPITAL_COMMUNITY): Payer: Self-pay | Admitting: Orthopedic Surgery

## 2018-07-31 LAB — GLUCOSE, CAPILLARY
Glucose-Capillary: 213 mg/dL — ABNORMAL HIGH (ref 70–99)
Glucose-Capillary: 181 mg/dL — ABNORMAL HIGH (ref 70–99)
Glucose-Capillary: 174 mg/dL — ABNORMAL HIGH (ref 70–99)
Glucose-Capillary: 161 mg/dL — ABNORMAL HIGH (ref 70–99)

## 2018-07-31 LAB — CBC
HCT: 38.1 % — ABNORMAL LOW (ref 39.0–52.0)
Hemoglobin: 12.8 g/dL — ABNORMAL LOW (ref 13.0–17.0)
MCH: 34.2 pg — ABNORMAL HIGH (ref 26.0–34.0)
MCHC: 33.6 g/dL (ref 30.0–36.0)
MCV: 101.9 fL — ABNORMAL HIGH (ref 80.0–100.0)
Platelets: 69 10*3/uL — ABNORMAL LOW (ref 150–400)
RBC: 3.74 MIL/uL — ABNORMAL LOW (ref 4.22–5.81)
RDW: 12.2 % (ref 11.5–15.5)
WBC: 6.5 10*3/uL (ref 4.0–10.5)
nRBC: 0 % (ref 0.0–0.2)

## 2018-07-31 LAB — BASIC METABOLIC PANEL
Anion gap: 7 (ref 5–15)
BUN: 21 mg/dL (ref 8–23)
CO2: 23 mmol/L (ref 22–32)
Calcium: 8.4 mg/dL — ABNORMAL LOW (ref 8.9–10.3)
Chloride: 106 mmol/L (ref 98–111)
Creatinine, Ser: 1.22 mg/dL (ref 0.61–1.24)
GFR calc Af Amer: >60 mL/min (ref >60)
GFR calc non Af Amer: 55 mL/min — ABNORMAL LOW (ref >60)
Glucose, Bld: 259 mg/dL — ABNORMAL HIGH (ref 70–99)
Potassium: 4.4 mmol/L (ref 3.5–5.1)
Sodium: 136 mmol/L (ref 135–145)

## 2018-07-31 MED ORDER — INSULIN ASPART 100 UNIT/ML ~~LOC~~ SOLN
0.0000 [IU] | Freq: Every evening | SUBCUTANEOUS | Status: DC | PRN
  Administered 2018-07-30: 8 [IU] via SUBCUTANEOUS
  Filled 2018-07-31: qty 0.15

## 2018-07-31 NOTE — Care Management Note (Signed)
Case Management Note  Patient Details  Name: Jared Martinez MRN: [id number] Date of Birth: Dec 25, 1940  Subjective/Objective:   Plan for d/c to SNF, discharge planning per CSW. [phone]                 Action/Plan:   Expected Discharge Date:                  Expected Discharge Plan:  [place]  In-House Referral:  Clinical Social Work  Discharge planning Services  CM Consult  Post Acute Care Choice:  NA Choice offered to:  Patient  DME Arranged:  N/A DME Agency:  NA  HH Arranged:  NA HH Agency:  NA  Status of Service:  Completed, signed off  If discussed at [place] of Stay Meetings, dates discussed:    Additional Comments:  Guadalupe Maple, RN 07/31/2018, 12:07 PM

## 2018-07-31 NOTE — Progress Notes (Signed)
Right knee incision oozing, Aquacel saturated, Gaspar Skeeters, PA notified, 4x4, ABD, Kerlix and ace wrap applied, continue to monitor surgical site for bleeding. [place] Fremon Zacharia RN BSN 07/30/2018 [phone]

## 2018-07-31 NOTE — Anesthesia Postprocedure Evaluation (Signed)
Anesthesia Post Note  Patient: Jared Martinez  Procedure(s) Performed: RIGHT TOTAL KNEE REVISION OF PATELLA AND TBIAL BEARING (Right Knee)     Patient location during evaluation: PACU Anesthesia Type: Regional and General Level of consciousness: awake and alert Pain management: pain level controlled Vital Signs Assessment: post-procedure vital signs reviewed and stable Respiratory status: spontaneous breathing, nonlabored ventilation, respiratory function stable and patient connected to nasal cannula oxygen Cardiovascular status: blood pressure returned to baseline and stable Postop Assessment: no apparent nausea or vomiting Anesthetic complications: no    Last Vitals:  Vitals:   07/31/18 0151 07/31/18 0524  BP: (!) 146/81 (!) 152/106  Pulse: 60 (!) 56  Resp: 17   Temp:    SpO2: 95% 100%    Last Pain:  Vitals:   07/31/18 0533  TempSrc:   PainSc: 3                  Ashtin Melichar P Mariene Dickerman

## 2018-07-31 NOTE — NC FL2 (Signed)
[place] LEVEL OF CARE SCREENING TOOL     IDENTIFICATION  Patient Name: Jared Martinez Birthdate: 01-Feb-1941 Sex: male Admission Date (Current Location): 07/30/2018  [facility] and [place] Number:  Herbalist and Address:  [facility],  [place] [address] [place]      Provider Number: [id number]  Attending Physician Name and Address:  Frederik Pear, MD  Relative Name and Phone Number:       Current Level of Care: Hospital Recommended Level of Care: [place] Prior Approval Number:    Date Approved/Denied:   PASRR Number:    Discharge Plan:      Current Diagnoses: Patient Active Problem List   Diagnosis Date Noted  . Primary osteoarthritis of right knee 07/30/2018  . Painful total knee replacement, right ([place]) 07/25/2018  . History of colonic polyps 10/23/2017  . Hepatic cirrhosis ([place]) 04/21/2017  . Postoperative anemia due to acute blood loss 01/05/2012  . Hyponatremia 01/05/2012  . Right knee DJD 12/27/2011  . Hypertension   . GERD (gastroesophageal reflux disease)   . Hyperlipidemia   . Anxiety   . Sleep apnea   . Depression     Orientation RESPIRATION BLADDER Height & Weight     Self, Time, Situation, Place  Normal Continent Weight: 266 lb (120.7 kg) Height:  5\' 11"  (180.3 cm)  BEHAVIORAL SYMPTOMS/MOOD NEUROLOGICAL BOWEL NUTRITION STATUS      Continent Diet(Carb Modified. )  AMBULATORY STATUS COMMUNICATION OF NEEDS Skin   Extensive Assist Verbally Surgical wounds                       Personal Care Assistance Level of Assistance  Bathing, Feeding, Dressing Bathing Assistance: Limited assistance Feeding assistance: Independent Dressing Assistance: Limited assistance     Functional Limitations Info  Sight, Hearing, Speech Sight Info: Impaired Hearing Info: Adequate Speech Info: Adequate    SPECIAL CARE FACTORS FREQUENCY  PT (By licensed PT), OT (By licensed OT)     PT Frequency:  5x/week OT Frequency: 5x/week            Contractures Contractures Info: Not present    Additional Factors Info  Code Status, Allergies, Psychotropic, Insulin Sliding Scale Code Status Info: Fullcode Allergies Info: : Doxycycline, Flomax Tamsulosin, Oxybutynin, Adhesive Tape Psychotropic Info: remeron, gabapentin  Insulin Sliding Scale Info: see medication list        Current Medications (07/31/2018):  This is the current hospital active medication list Current Facility-Administered Medications  Medication Dose Route Frequency Provider Last Rate Last Dose  . acetaminophen (TYLENOL) tablet 325-650 mg  325-650 mg Oral Q6H PRN Leighton Parody, PA-C      . alum & mag hydroxide-simeth (MAALOX/MYLANTA) 200-200-20 MG/5ML suspension 30 mL  30 mL Oral Q4H PRN Leighton Parody, PA-C      . bisacodyl (DULCOLAX) EC tablet 5 mg  5 mg Oral Daily PRN Leighton Parody, PA-C      . citalopram (CELEXA) tablet 20 mg  20 mg Oral QHS Joanell Rising K, PA-C      . cycloSPORINE (RESTASIS) 0.05 % ophthalmic emulsion 1 drop  1 drop Both Eyes BID PRN Leighton Parody, PA-C      . dextrose 5 % and 0.45 % NaCl with KCl 20 mEq/L infusion   Intravenous Continuous Leighton Parody, PA-C   Stopped at 07/31/18 1232  . diphenhydrAMINE (BENADRYL) 12.5 MG/5ML elixir 12.5-25 mg  12.5-25 mg Oral Q4H PRN  Leighton Parody, PA-C      . docusate sodium (COLACE) capsule 100 mg  100 mg Oral BID Leighton Parody, PA-C   100 mg at 07/31/18 3557  . finasteride (PROSCAR) tablet 5 mg  5 mg Oral q morning - 10a Leighton Parody, PA-C   5 mg at 07/31/18 0935  . furosemide (LASIX) tablet 40 mg  40 mg Oral QHS Leighton Parody, PA-C   40 mg at 07/30/18 2230  . gabapentin (NEURONTIN) capsule 300 mg  300 mg Oral TID Leighton Parody, PA-C   300 mg at 07/31/18 3220  . HYDROmorphone (DILAUDID) injection 0.5-1 mg  0.5-1 mg Intravenous Q4H PRN Leighton Parody, PA-C      . insulin aspart (novoLOG) injection 0-15 Units  0-15 Units  Subcutaneous TID WC Leighton Parody, PA-C   3 Units at 07/31/18 1229  . insulin aspart (novoLOG) injection 0-15 Units  0-15 Units Subcutaneous QHS PRN Gary Fleet, PA-C   8 Units at 07/30/18 2300  . lisinopril (PRINIVIL,ZESTRIL) tablet 10 mg  10 mg Oral q morning - 10a Leighton Parody, PA-C   10 mg at 07/31/18 2542  . loratadine (CLARITIN) tablet 10 mg  10 mg Oral Daily Leighton Parody, PA-C   10 mg at 07/31/18 0936  . Melatonin TABS 3 mg  3 mg Oral QHS Leighton Parody, PA-C   3 mg at 07/30/18 2230  . menthol-cetylpyridinium (CEPACOL) lozenge 3 mg  1 lozenge Oral PRN Leighton Parody, PA-C   3 mg at 07/31/18 0935   Or  . phenol (CHLORASEPTIC) mouth spray 1 spray  1 spray Mouth/Throat PRN Leighton Parody, PA-C      . metFORMIN (GLUCOPHAGE) tablet 500 mg  500 mg Oral Q breakfast Leighton Parody, PA-C   500 mg at 07/31/18 0805  . methocarbamol (ROBAXIN) tablet 500 mg  500 mg Oral Q6H PRN Leighton Parody, PA-C   500 mg at 07/31/18 0805   Or  . methocarbamol (ROBAXIN) 500 mg in dextrose 5 % 50 mL IVPB  500 mg Intravenous Q6H PRN Leighton Parody, PA-C   Stopped at 07/30/18 1346  . metoCLOPramide (REGLAN) tablet 5-10 mg  5-10 mg Oral Q8H PRN Leighton Parody, PA-C       Or  . metoCLOPramide (REGLAN) injection 5-10 mg  5-10 mg Intravenous Q8H PRN Leighton Parody, PA-C      . mirtazapine (REMERON) tablet 15 mg  15 mg Oral QHS Leighton Parody, PA-C   15 mg at 07/30/18 2230  . nadolol (CORGARD) tablet 40 mg  40 mg Oral QHS Leighton Parody, PA-C   40 mg at 07/30/18 2230  . ondansetron (ZOFRAN) tablet 4 mg  4 mg Oral Q6H PRN Leighton Parody, PA-C       Or  . ondansetron [facility]) injection 4 mg  4 mg Intravenous Q6H PRN Leighton Parody, PA-C      . oxyCODONE (Oxy IR/ROXICODONE) immediate release tablet 5-10 mg  5-10 mg Oral Q4H PRN Leighton Parody, PA-C   5 mg at 07/31/18 0538  . pantoprazole (PROTONIX) EC tablet 40 mg  40 mg Oral Daily Leighton Parody, PA-C   40 mg at 07/31/18 7062  .  polyethylene glycol (MIRALAX / GLYCOLAX) packet 17 g  17 g Oral Daily PRN Joanell Rising K, PA-C      . sodium phosphate (FLEET) 7-19 GM/118ML enema 1 enema  1 enema Rectal Once  PRN Leighton Parody, PA-C         Discharge Medications: Please see discharge summary for a list of discharge medications.  Relevant Imaging Results:  Relevant Lab Results:   Additional Information ssn:[phone]  Lia Hopping, LCSW

## 2018-07-31 NOTE — Progress Notes (Signed)
Physical Therapy Treatment Patient Details Name: Jared Martinez MRN: [id number] DOB: 10-11-1941 Today's Date: 07/31/2018    History of Present Illness 77 YO male s/p R TKR revision/patella on 07/30/18. PMH includes nonalcoholic hepatic cirrhosis, DJD, HTN, HLD, anxiety, sleep apnea, depression, 1st degree AV block, LE edema, DMII. Surgical history includes R shoulder arthroscopy, R TKR 2013, L TKR 2006.     PT Comments    Pt assisted with ambulating in hallway and then to recliner.  Pt with reported significant bleeding this morning.  Pt with some bleeding after ambulating so ice applied and will defer exercises to this afternoon.  RN into room end of session.   Follow Up Recommendations  Follow surgeon's recommendation for DC plan and follow-up therapies;Supervision for mobility/OOB(plans for SNF)     Equipment Recommendations  None recommended by PT    Recommendations for Other Services       Precautions / Restrictions Precautions Precautions: Fall;Knee Restrictions Weight Bearing Restrictions: No Other Position/Activity Restrictions: WBAT     Mobility  Bed Mobility Overal bed mobility: Needs Assistance Bed Mobility: Supine to Sit     Supine to sit: Min assist     General bed mobility comments: assist for R LE  Transfers Overall transfer level: Needs assistance Equipment used: Rolling walker (2 wheeled) Transfers: Sit to/from Stand Sit to Stand: Min guard;From elevated surface         General transfer comment: min/guard for safety, verbal cues for UE and LE positioning, slightly dizzy with sitting however resolved  Ambulation/Gait Ambulation/Gait assistance: Min guard Gait Distance (Feet): 140 Feet Assistive device: Rolling walker (2 wheeled) Gait Pattern/deviations: Step-to pattern;Trunk flexed;Decreased stance time - right     General Gait Details: verbal cues for sequence, RW positioning, step length, pt denies dizziness with ambulation   Stairs              Wheelchair Mobility    Modified Rankin (Stroke Patients Only)       Balance                                            Cognition Arousal/Alertness: Awake/alert Behavior During Therapy: WFL for tasks assessed/performed Overall Cognitive Status: Within Functional Limits for tasks assessed                                        Exercises      General Comments        Pertinent Vitals/Pain Pain Assessment: 0-10 Pain Score: 3  Pain Location: R knee Pain Descriptors / Indicators: Sore;Aching Pain Intervention(s): Repositioned;Limited activity within patient's tolerance;Monitored during session    Home Living                      Prior Function            PT Goals (current goals can now be found in the care plan section) Progress towards PT goals: Progressing toward goals    Frequency    7X/week      PT Plan Current plan remains appropriate    Co-evaluation              AM-PAC PT "6 Clicks" Daily Activity  Outcome Measure  Difficulty turning over in bed (including adjusting bedclothes, sheets and blankets)?:  A Lot Difficulty moving from lying on back to sitting on the side of the bed? : Unable Difficulty sitting down on and standing up from a chair with arms (e.g., wheelchair, bedside commode, etc,.)?: Unable Help needed moving to and from a bed to chair (including a wheelchair)?: A Little Help needed walking in hospital room?: A Little Help needed climbing 3-5 steps with a railing? : A Lot 6 Click Score: 12    End of Session Equipment Utilized During Treatment: Gait belt Activity Tolerance: Patient tolerated treatment well Patient left: in chair;with chair alarm set;with call bell/phone within reach;with nursing/sitter in room Nurse Communication: Mobility status PT Visit Diagnosis: Other abnormalities of gait and mobility (R26.89);Difficulty in walking, not elsewhere classified (R26.2)      Time: 3646-8032 PT Time Calculation (min) (ACUTE ONLY): 30 min  Charges:  $Gait Training: 23-37 mins                    Carmelia Bake, PT, DPT Acute Rehabilitation Services Office: [phone] Pager: [phone]  Trena Platt 07/31/2018, 11:19 AM

## 2018-07-31 NOTE — Progress Notes (Signed)
Physical Therapy Treatment Patient Details Name: Jared Martinez MRN: [id number] DOB: 10-21-1940 Today's Date: 07/31/2018    History of Present Illness 77 YO male s/p R TKR revision/patella on 07/30/18. PMH includes nonalcoholic hepatic cirrhosis, DJD, HTN, HLD, anxiety, sleep apnea, depression, 1st degree AV block, LE edema, DMII. Surgical history includes R shoulder arthroscopy, R TKR 2013, L TKR 2006.     PT Comments    Pt ambulated in hallway and assisted back to bed.  Pt performed LE exercises in supine.  Pt with some bleeding on padding and RN into room end of session for redressing R knee.   Follow Up Recommendations  Follow surgeon's recommendation for DC plan and follow-up therapies;Supervision for mobility/OOB(plans for SNF)     Equipment Recommendations  None recommended by PT    Recommendations for Other Services       Precautions / Restrictions Precautions Precautions: Fall;Knee Required Braces or Orthoses: Knee Immobilizer - Right Restrictions Other Position/Activity Restrictions: WBAT     Mobility  Bed Mobility Overal bed mobility: Needs Assistance Bed Mobility: Sit to Supine     Supine to sit: Min assist Sit to supine: Min assist   General bed mobility comments: assist for R LE  Transfers Overall transfer level: Needs assistance Equipment used: Rolling walker (2 wheeled) Transfers: Sit to/from Stand Sit to Stand: Min assist         General transfer comment: verbal cues for UE and LE positioning, assist to rise from lower surface recliner  Ambulation/Gait Ambulation/Gait assistance: Min guard Gait Distance (Feet): 160 Feet Assistive device: Rolling walker (2 wheeled) Gait Pattern/deviations: Step-to pattern;Trunk flexed;Decreased stance time - right;Step-through pattern     General Gait Details: verbal cues for sequence, RW positioning, step length   Stairs             Wheelchair Mobility    Modified Rankin (Stroke Patients  Only)       Balance                                            Cognition Arousal/Alertness: Awake/alert Behavior During Therapy: WFL for tasks assessed/performed Overall Cognitive Status: Within Functional Limits for tasks assessed                                        Exercises Total Joint Exercises Ankle Circles/Pumps: AROM;Both;10 reps Quad Sets: AROM;10 reps;Both Short Arc Quad: AROM;10 reps;Right Heel Slides: AAROM;10 reps;Right Hip ABduction/ADduction: AROM;10 reps;Right Straight Leg Raises: AROM;10 reps;Right    General Comments        Pertinent Vitals/Pain Pain Assessment: 0-10 Pain Score: 5  Pain Location: R knee Pain Descriptors / Indicators: Sore;Aching Pain Intervention(s): Limited activity within patient's tolerance;Repositioned;Monitored during session    Home Living                      Prior Function            PT Goals (current goals can now be found in the care plan section) Progress towards PT goals: Progressing toward goals    Frequency    7X/week      PT Plan Current plan remains appropriate    Co-evaluation              AM-PAC PT "6  Clicks" Daily Activity  Outcome Measure  Difficulty turning over in bed (including adjusting bedclothes, sheets and blankets)?: A Lot Difficulty moving from lying on back to sitting on the side of the bed? : Unable Difficulty sitting down on and standing up from a chair with arms (e.g., wheelchair, bedside commode, etc,.)?: Unable Help needed moving to and from a bed to chair (including a wheelchair)?: A Little Help needed walking in hospital room?: A Little Help needed climbing 3-5 steps with a railing? : A Lot 6 Click Score: 12    End of Session Equipment Utilized During Treatment: Gait belt Activity Tolerance: Patient tolerated treatment well Patient left: in bed;with call bell/phone within reach;with nursing/sitter in room(RN in room,  redressing R knee) Nurse Communication: Mobility status PT Visit Diagnosis: Other abnormalities of gait and mobility (R26.89);Difficulty in walking, not elsewhere classified (R26.2)     Time: 5093-2671 PT Time Calculation (min) (ACUTE ONLY): 20 min  Charges:   $Therapeutic Exercise: 8-22 mins                     Carmelia Bake, PT, DPT Acute Rehabilitation Services Office: [phone] Pager: [phone]  Trena Platt 07/31/2018, 2:51 PM

## 2018-07-31 NOTE — Progress Notes (Signed)
PATIENT ID: Jared Martinez  MRN: [id number]  DOB/AGE:  06/20/1941 / 77 y.o.  1 Day Post-Op Procedure(s) (LRB): RIGHT TOTAL KNEE REVISION OF PATELLA AND TBIAL BEARING (Right)    PROGRESS NOTE Subjective: Patient is alert, oriented, No Nausea, No Vomiting, yes passing gas. Taking PO Well. Denies SOB, Chest or Calf Pain. Using Incentive Spirometer, PAS in place. Ambulate In room, Patient reports pain as 3/10 .    Objective: Vital signs in last 24 hours: Vitals:   07/30/18 1713 07/30/18 2215 07/31/18 0151 07/31/18 0524  BP: (Abnormal) 155/81 139/82 (Abnormal) 146/81 (Abnormal) 152/106  Pulse: 70 63 60 (Abnormal) 56  Resp: 16  17   Temp: 97.7 F (36.5 C) 98.2 F (36.8 C)    TempSrc: Oral Oral    SpO2: 97% 99% 95% 100%  Weight:      Height:          Intake/Output from previous day: I/O last 3 completed shifts: In: 3386.7 [P.O.:480; I.V.:2556.7; IV Piggyback:350] Out: 1400 [Urine:1300; Blood:100]   Intake/Output this shift: No intake/output data recorded.   LABORATORY DATA: Recent Labs    07/30/18 1312  07/30/18 1434 07/30/18 1713 07/30/18 2231 07/31/18 0532  WBC  --   --   --   --   --  6.5  HGB  --   --   --   --   --  12.8*  HCT  --   --   --   --   --  38.1*  PLT  --   --   --   --   --  69*  NA  --   --  142  --   --  136  K  --   --  3.7  --   --  4.4  CL  --   --  105  --   --  106  CO2  --   --  26  --   --  23  BUN  --   --  18  --   --  21  CREATININE  --   --  1.31*  --   --  1.22  GLUCOSE  --   --  152*  --   --  259*  GLUCAP 126*  --   --  209* 283*  --   CALCIUM  --    < > 8.7*  --   --  8.4*   < > = values in this interval not displayed.    Examination: Neurologically intact ABD soft Neurovascular intact Sensation intact distally Intact pulses distally Dorsiflexion/Plantar flexion intact Incision: moderate drainage No cellulitis present Compartment soft dressing taken down.  Wound appeared clean, no active bleeding cleansed skin with hydrogen  peroxide and applied new 4 x 4 and Ace wrap dressing.  Patient is resting comfortably in a 0 bone foam}  Assessment:   1 Day Post-Op Procedure(s) (LRB): RIGHT TOTAL KNEE REVISION OF PATELLA AND TBIAL BEARING (Right) ADDITIONAL DIAGNOSIS: Expected Acute Blood Loss Anemia, Diabetes, Hypertension and Sleep Apnea, cirrhosis, Thrombocytopenia Anticipated LOS equal to or greater than 2 midnights due to - Age 55 and older with one or more of the following:  - Obesity  - Expected need for hospital services (PT, OT, Nursing) required for safe  discharge  - Anticipated need for postoperative skilled nursing care or inpatient rehab  - Active co-morbidities: Diabetes, Anemia and Advanced Liver Disease    Plan: PT/OT WBAT, AROM and PROM  DVT  Prophylaxis:  SCDx72hrs, We will discontinue aspirin because of thrombocytopenia and oozing postop DISCHARGE PLAN: Home DISCHARGE NEEDS: HHPT, Walker and 3-in-1 comode seat     Jared Martinez 07/31/2018, 7:23 AM Patient ID: Jared Martinez, male   DOB: 12-22-1940, 76 y.o.   MRN: [id number]

## 2018-07-31 NOTE — Clinical Social Work Note (Signed)
Clinical Social Work Assessment  Patient Details  Name: Jared Martinez MRN: [id number] Date of Birth: November 18, 1940  Date of referral:  07/31/18               Reason for consult:  Discharge Planning                Permission sought to share information with:  Facility Art therapist granted to share information::  Yes, Verbal Permission Granted  Name::        Agency::  SNF   Relationship::   Daughter   Contact Information:     Housing/Transportation Living arrangements for the past 2 months:  Single Family Home Source of Information:  Patient Patient Interpreter Needed:  None Criminal Activity/Legal Involvement Pertinent to Current Situation/Hospitalization:  No - Comment as needed Significant Relationships:  Dependent Children Lives with:  Self Do you feel safe going back to the place where you live?  No Need for family participation in patient care:  Yes (Comment)  Care giving concerns:   Painful right knee arthroplasty/patella/ Right total knee revision.  SNF placement for short rehab.   Social Worker assessment / plan:  CSW discussed discharge plan to SNF with patient. Patient reports he will not have assistance with his care at home.  Patient reports he discussed facility options with his daughter and both prefer a facility in [place], [place]. Patient is agreeable to [facility]. Patient reports he can bathe, feed and dress self without assistance. Patient is hopeful to regain his strength and return home to his dogs.  FL2 completed.  Patient PASRR complete. PASRR is a level II (will need 30 day note signed by the physician. CSW left 30 day note with the nurse)  Plan: SNF ([facility])  Employment status:  Retired Insurance underwriter information:  Medicare PT Recommendations:  [place] / Referral to community resources:  [place]  Patient/Family's Response to care:  Agreeable and  Responding well to care.   Patient/Family's Understanding of and Emotional Response to Diagnosis, Current Treatment, and Prognosis:  "Its not a good idea to go home by myself." Patient has a fairly understands his diagnosis and follow up care at [facility]. Patient daughter is involved in patient care.   Emotional Assessment Appearance:  Appears younger than stated age Attitude/Demeanor/Rapport:    Affect (typically observed):  Accepting, Pleasant Orientation:  Oriented to Self, Oriented to Situation, Oriented to Place, Oriented to  Time Alcohol / Substance use:  Not Applicable Psych involvement (Current and /or in the community):  No (Comment)  Discharge Needs  Concerns to be addressed:  Discharge Planning Concerns Readmission within the last 30 days:  No Current discharge risk:  Dependent with Mobility Barriers to Discharge:  Continued Medical Work up, Ship broker, Programmer, applications ([place])   Lia Hopping, LCSW 07/31/2018, 3:56 PM

## 2018-08-01 ENCOUNTER — Inpatient Hospital Stay (HOSPITAL_COMMUNITY): Payer: Medicare Other

## 2018-08-01 DIAGNOSIS — T8484XD Pain due to internal orthopedic prosthetic devices, implants and grafts, subsequent encounter: Secondary | ICD-10-CM

## 2018-08-01 DIAGNOSIS — D649 Anemia, unspecified: Secondary | ICD-10-CM

## 2018-08-01 DIAGNOSIS — Z96651 Presence of right artificial knee joint: Secondary | ICD-10-CM

## 2018-08-01 DIAGNOSIS — H538 Other visual disturbances: Secondary | ICD-10-CM

## 2018-08-01 DIAGNOSIS — I10 Essential (primary) hypertension: Secondary | ICD-10-CM

## 2018-08-01 LAB — IRON AND TIBC
Iron: 133 ug/dL (ref 45–182)
Saturation Ratios: 51 % — ABNORMAL HIGH (ref 17.9–39.5)
TIBC: 260 ug/dL (ref 250–450)
UIBC: 127 ug/dL

## 2018-08-01 LAB — CBC
HCT: 36.9 % — ABNORMAL LOW (ref 39.0–52.0)
Hemoglobin: 12.6 g/dL — ABNORMAL LOW (ref 13.0–17.0)
MCH: 34.2 pg — ABNORMAL HIGH (ref 26.0–34.0)
MCHC: 34.1 g/dL (ref 30.0–36.0)
MCV: 100.3 fL — ABNORMAL HIGH (ref 80.0–100.0)
Platelets: 73 10*3/uL — ABNORMAL LOW (ref 150–400)
RBC: 3.68 MIL/uL — ABNORMAL LOW (ref 4.22–5.81)
RDW: 12.6 % (ref 11.5–15.5)
WBC: 5.8 10*3/uL (ref 4.0–10.5)
nRBC: 0 % (ref 0.0–0.2)

## 2018-08-01 LAB — FERRITIN: Ferritin: 128 ng/mL (ref 24–336)

## 2018-08-01 LAB — GLUCOSE, CAPILLARY
Glucose-Capillary: 124 mg/dL — ABNORMAL HIGH (ref 70–99)
Glucose-Capillary: 119 mg/dL — ABNORMAL HIGH (ref 70–99)
Glucose-Capillary: 147 mg/dL — ABNORMAL HIGH (ref 70–99)
Glucose-Capillary: 136 mg/dL — ABNORMAL HIGH (ref 70–99)

## 2018-08-01 LAB — RETICULOCYTES
Immature Retic Fract: 11.8 % (ref 2.3–15.9)
RBC.: 3.99 MIL/uL — ABNORMAL LOW (ref 4.22–5.81)
Retic Count, Absolute: 75.4 10*3/uL (ref 19.0–186.0)
Retic Ct Pct: 1.9 % (ref 0.4–3.1)

## 2018-08-01 LAB — FOLATE: Folate: 8.9 ng/mL (ref >5.9)

## 2018-08-01 LAB — VITAMIN B12: Vitamin B-12: 299 pg/mL (ref 180–914)

## 2018-08-01 NOTE — Progress Notes (Signed)
PATIENT ID: Jared Martinez  MRN: [id number]  DOB/AGE:  [birth year] / 77 y.o.  2 Days Post-Op Procedure(s) (LRB): RIGHT TOTAL KNEE REVISION OF PATELLA AND TBIAL BEARING (Right)    PROGRESS NOTE Subjective: Patient is alert, oriented, no Nausea, no Vomiting, yes passing gas. Taking PO well. Denies SOB, Chest or Calf Pain. Using Incentive Spirometer, PAS in place. Ambulate WBAT with pt walking 160 ft with therapy, Patient reports pain as 0/10 at rest, worse with activity .    Objective: Vital signs in last 24 hours: Vitals:   07/31/18 0948 07/31/18 1350 07/31/18 2104 07/31/18 2229  BP: 123/70 126/68 116/76   Pulse: 60 (!) 57 (!) 58 (!) 56  Resp: 16 16 20 20   Temp: (!) 97.5 F (36.4 C) (!) 97.4 F (36.3 C) 97.6 F (36.4 C)   TempSrc: Oral Oral Oral   SpO2: 95% 97% 94% 95%  Weight:      Height:          Intake/Output from previous day: I/O last 3 completed shifts: In: 2211.6 [P.O.:1070; I.V.:1141.6] Out: 2700 [Urine:2700]   Intake/Output this shift: Total I/O In: -  Out: 650 [Urine:650]   LABORATORY DATA: Recent Labs    07/30/18 1434  07/31/18 0532  07/31/18 1655 07/31/18 2100 08/01/18 0523 08/01/18 0736  WBC  --   --  6.5  --   --   --  5.8  --   HGB  --   --  12.8*  --   --   --  12.6*  --   HCT  --   --  38.1*  --   --   --  36.9*  --   PLT  --   --  69*  --   --   --  73*  --   NA 142  --  136  --   --   --   --   --   K 3.7  --  4.4  --   --   --   --   --   CL 105  --  106  --   --   --   --   --   CO2 26  --  23  --   --   --   --   --   BUN 18  --  21  --   --   --   --   --   CREATININE 1.31*  --  1.22  --   --   --   --   --   GLUCOSE 152*  --  259*  --   --   --   --   --   GLUCAP  --    < >  --    < > 174* 161*  --  124*  CALCIUM 8.7*  --  8.4*  --   --   --   --   --    < > = values in this interval not displayed.    Examination: Neurologically intact Neurovascular intact Sensation intact distally Intact pulses distally Dorsiflexion/Plantar  flexion intact Incision: dressing C/D/I No cellulitis present Compartment soft}  Assessment:   2 Days Post-Op Procedure(s) (LRB): RIGHT TOTAL KNEE REVISION OF PATELLA AND TBIAL BEARING (Right) ADDITIONAL DIAGNOSIS: Expected Acute Blood Loss Anemia, Diabetes, Hypertension, Sleep Apnea and cirrhosis, Thrombocytopenia Anticipated LOS equal to or greater than 2 midnights due to - Age 79 and older with one or more of  the following:  - Obesity  - Expected need for hospital services (PT, OT, Nursing) required for safe  discharge  - Anticipated need for postoperative skilled nursing care or inpatient rehab  - Active co-morbidities: Diabetes, Anemia and Advanced liver disease OR - Patient is a high risk of re-admission due to: Barriers to post-acute care (logistical, no family support in home)    Plan: PT/OT WBAT, AROM and PROM  DVT Prophylaxis:  SCDx72hrs, ASA 81 mg BID x 2 weeks DISCHARGE PLAN: Skilled Nursing Facility/Rehab DISCHARGE NEEDS: HHPT, Walker and 3-in-1 comode seat     Joanell Rising 08/01/2018, 7:57 AM

## 2018-08-01 NOTE — Progress Notes (Signed)
Pt began bleeding from the incision site after PT. There was an order to apply a Silver Hydrofiber dressing, which was applied. Night RN reported off that the pt experienced bleeding for the incision site as well. Joanell Rising, PA was notified of the drainage.

## 2018-08-01 NOTE — Consult Note (Signed)
[facility] Medical Consultation  ASKARI KINLEY INO:[id number] DOB: 1941-01-02 DOA: 07/30/2018 PCP: Monico Blitz, MD   Requesting physician: Joanell Rising, PA Date of consultation: 08/01/2018 Reason for consultation: blurred vision  HPI:  77 year old male with history of hypertension, diabetes mellitus, liver cirrhosis, prior tobacco use currently in remission, who was admitted to the orthopedic surgery service for right knee revision on Monday who complains of blurred vision.  Patient tells me that he has been having a little bit more difficulties reading small print and he noticed that started right after surgery.  He was fine prior to that.  He never reported this to anybody up until this morning, he initially thought he was going to go away but it did not.  He complains of slight dizziness since surgery as well.  Also complains of left lower extremity numbness, however this is chronic and thinks it is related to his diabetes.  No weakness, no other numbness/tingling.  Review of Systems:  As per HPI, otherwise 10 point negative   Impression/Recommendations Principal Problem:   Painful total knee replacement, right (HCC) Active Problems:   Primary osteoarthritis of right knee    1. Blurred vision -most important need to rule out perioperative CVA since this is new onset, will obtain an MRI of the brain.  If this is negative, probably okay to discharge later today and will need outpatient ophthalmology follow-up. 2. Diabetes mellitus -CBGs well-controlled, no significant hyperglycemia, continue sliding scale as well as home medications 3. Hypertension -continue home medications, blood pressure controlled 4. BPH -on finasteride 5. Liver cirrhosis -unclear etiology, appears stable and without issues 6. Macrocytic anemia -check anemia panel, hemoglobin stable 7. Thrombocytopenia -likely related to liver disease  Will follow-up on the MRI.   Past Medical History:    Diagnosis Date  . Anxiety   . Arthritis   . Bilateral edema of lower extremity    right > left --- per pt cellulitis right lower leg 2016 , has had swelling since  . BPH (benign prostatic hyperplasia)   . Depression   . First degree AV block   . Hepatic cirrhosis, unspecified hepatic cirrhosis type ([place]) dx 2016   per pt non-alcoholic fatty liver---  pt established care w/ dr fields [facility])--  04-26-2017  liver ultrasound --  cirrohoisis ,  no asites  . History of adenomatous polyp of colon    2016  tubular adenoma's  (per dr fields note)  . Hyperlipidemia   . Hypertension   . Lower urinary tract symptoms (LUTS)   . Nocturia   . OSA (obstructive sleep apnea)    per pt has CPAP but has not used since Jan 2018-  hx sleep apnea surgery's  . RBBB (right bundle branch block)   . Type 2 diabetes mellitus ([place])    followed by pcp   Past Surgical History:  Procedure Laterality Date  . APPENDECTOMY  teen  . CATARACT EXTRACTION W/ INTRAOCULAR LENS  IMPLANT, BILATERAL  2017  . COLONOSCOPY  2016   multiple sessile polyps (tubular adenomas), diverticulosis in left colon, small angiodysplastic lesion in transverse and ascending colon.  . CYSTOSCOPY N/A 05/04/2017   Procedure: Evie Lacks DILITATION;  Surgeon: Irine Seal, MD;  Location: [facility];  Service: Urology;  Laterality: N/A;  . ESOPHAGOGASTRODUODENOSCOPY  06/2016   3 columns of medium-sized varices without stigmata of bleeding. Otherwise normal.  . NASAL SEPTUM SURGERY  x2  last one yrs ago  . PILONIDAL CYST EXCISION  x3  last one 1970's  . SHOULDER ARTHROSCOPY Right 2010 approx.  Marland Kitchen TOTAL KNEE ARTHROPLASTY  01/02/2012   Procedure: TOTAL KNEE ARTHROPLASTY;  Surgeon: Lorn Junes, MD;  Location: [facility];  Service: Orthopedics;  Laterality: Right;  . TOTAL KNEE ARTHROPLASTY Left [place] , [place]  . TOTAL KNEE REVISION Right 07/30/2018   Procedure: RIGHT TOTAL KNEE REVISION OF PATELLA  AND TBIAL BEARING;  Surgeon: Frederik Pear, MD;  Location: [facility];  Service: Orthopedics;  Laterality: Right;  Adductor Block  . TRANSURETHRAL RESECTION OF PROSTATE N/A 05/04/2017   Procedure: TRANSURETHRAL RESECTION  OF THE PROSTATE;  Surgeon: Irine Seal, MD;  Location: [facility];  Service: Urology;  Laterality: N/A;  . UVULOPALATOPHARYNGOPLASTY  ?unknown date   w/  bilateral Tonsillectomy   Social History:  reports that he quit smoking about 25 years ago. His smoking use included cigarettes. He quit after 25.00 years of use. He quit smokeless tobacco use about 25 years ago.  His smokeless tobacco use included snuff. He reports that he does not drink alcohol or use drugs.  Allergies  Allergen Reactions  . Doxycycline Nausea And Vomiting  . Flomax [Tamsulosin] Other (See Comments)    "mouth breaks out"  . Oxybutynin Nausea Only and Other (See Comments)    "severe nausea"  . Adhesive [Tape] Rash   Family History  Problem Relation Age of Onset  . Alzheimer's disease Mother   . Liver cancer Father   . Arthritis Sister   . Arthritis Brother   . Colon cancer Neg Hx   . Colon polyps Neg Hx     Prior to Admission medications   Medication Sig Start Date End Date Taking? Authorizing Provider  acetaminophen (TYLENOL) 500 MG tablet Take 500 mg by mouth every 6 (six) hours as needed for moderate pain or headache.   Yes [provider]  aspirin EC 81 MG tablet Take 1 tablet (81 mg total) by mouth daily. 05/11/17  Yes Filippou, Braxton Feathers, MD  cetirizine (ZYRTEC) 10 MG tablet Take 10 mg by mouth every morning.    Yes [provider]  citalopram (CELEXA) 20 MG tablet Take 20 mg by mouth at bedtime.    Yes [provider]  cycloSPORINE (RESTASIS) 0.05 % ophthalmic emulsion Place 1 drop into both eyes 2 (two) times daily as needed (for dry eyes).    Yes [provider]  finasteride (PROSCAR) 5 MG tablet Take 5 mg by mouth every morning.    Yes  [provider]  furosemide (LASIX) 40 MG tablet Take 40 mg by mouth at bedtime.    Yes [provider]  lisinopril (PRINIVIL,ZESTRIL) 20 MG tablet Take 10 mg by mouth every morning.    Yes [provider]  Melatonin 3 MG CAPS Take 3 mg by mouth at bedtime.   Yes [provider]  metFORMIN (GLUCOPHAGE) 500 MG tablet Take 500 mg by mouth daily with breakfast.   Yes [provider]  mirtazapine (REMERON) 15 MG tablet Take 15 mg by mouth at bedtime.   Yes [provider]  nadolol (CORGARD) 40 MG tablet TAKE 1 TABLET EVERY EVENING Patient taking differently: Take 40 mg by mouth at bedtime.  10/27/17  Yes Mahala Menghini, PA-C  OVER THE COUNTER MEDICATION Apply 1 application topically daily as needed (for infection). Emuaid Cream   Yes [provider]  aspirin EC 81 MG tablet Take 1 tablet (81 mg total) by mouth  2 (two) times daily. 07/30/18   Leighton Parody, PA-C  oxyCODONE-acetaminophen (PERCOCET/ROXICET) 5-325 MG tablet Take 1 tablet by mouth every 4 (four) hours as needed for severe pain. 07/30/18   Leighton Parody, PA-C  tiZANidine (ZANAFLEX) 2 MG tablet Take 1 tablet (2 mg total) by mouth every 6 (six) hours as needed. 07/30/18   Leighton Parody, PA-C   Physical Exam: Blood pressure 127/70, pulse (!) 57, temperature 97.6 F (36.4 C), temperature source Oral, resp. rate 20, height 5\' 11"  (1.803 m), weight 120.7 kg, SpO2 95 %. Vitals:   07/31/18 2229 08/01/18 0810  BP:  127/70  Pulse: (!) 56 (!) 57  Resp: 20   Temp:    SpO2: 95%      General: No distress, sitting in chair  Eyes: Pupils equal round and reactive, no scleral icterus  Cardiovascular: Regular rate and rhythm, no murmurs heard.  Trace peripheral edema  Respiratory: Clear to auscultation bilaterally, no wheezing, no crackles  Abdomen: Soft, nontender, nondistended  Skin: No rashes seen  Psychiatric: Normal mood  Neurologic: Cranial nerves grossly  intact, strength 5 out of 5 in all 4 extremities, no visual field deficits  Labs on Admission:  Basic Metabolic Panel: Recent Labs  Lab 07/30/18 1434 07/31/18 0532  NA 142 136  K 3.7 4.4  CL 105 106  CO2 26 23  GLUCOSE 152* 259*  BUN 18 21  CREATININE 1.31* 1.22  CALCIUM 8.7* 8.4*   Liver Function Tests: No results for input(s): AST, ALT, ALKPHOS, BILITOT, PROT, ALBUMIN in the last 168 hours. No results for input(s): LIPASE, AMYLASE in the last 168 hours. No results for input(s): AMMONIA in the last 168 hours. CBC: Recent Labs  Lab 07/31/18 0532 08/01/18 0523  WBC 6.5 5.8  HGB 12.8* 12.6*  HCT 38.1* 36.9*  MCV 101.9* 100.3*  PLT 69* 73*   Cardiac Enzymes: No results for input(s): CKTOTAL, CKMB, CKMBINDEX, TROPONINI in the last 168 hours. BNP: Invalid input(s): POCBNP CBG: Recent Labs  Lab 07/31/18 0744 07/31/18 1207 07/31/18 1655 07/31/18 2100 08/01/18 0736  GLUCAP 213* 181* 174* 161* 124*    Radiological Exams on Admission: No results found.    Mar[place]etta Board [facility] Pager [phone]  If 7PM-7AM, please contact night-coverage [email or web address] Password [email or web address] 08/01/2018, 9:51 AM

## 2018-08-01 NOTE — Discharge Summary (Addendum)
Patient ID: Jared Martinez MRN: [id number] DOB/AGE: 1941/03/19 77 y.o.  Admit date: 07/30/2018 Discharge date: 08/02/2018 Admission Diagnoses:  Principal Problem:   Painful total knee replacement, right ([place]) Active Problems:   Primary osteoarthritis of right knee   Discharge Diagnoses:  Same  Past Medical History:  Diagnosis Date  . Anxiety   . Arthritis   . Bilateral edema of lower extremity    right > left --- per pt cellulitis right lower leg 2016 , has had swelling since  . BPH (benign prostatic hyperplasia)   . Depression   . First degree AV block   . Hepatic cirrhosis, unspecified hepatic cirrhosis type ([place]) dx 2016   per pt non-alcoholic fatty liver---  pt established care w/ dr fields [facility])--  04-26-2017  liver ultrasound --  cirrohoisis ,  no asites  . History of adenomatous polyp of colon    2016  tubular adenoma's  (per dr fields note)  . Hyperlipidemia   . Hypertension   . Lower urinary tract symptoms (LUTS)   . Nocturia   . OSA (obstructive sleep apnea)    per pt has CPAP but has not used since Jan 2018-  hx sleep apnea surgery's  . RBBB (right bundle branch block)   . Type 2 diabetes mellitus ([place])    followed by pcp    Surgeries: Procedure(s): RIGHT TOTAL KNEE REVISION OF PATELLA AND TBIAL BEARING on 07/30/2018   Consultants:   Discharged Condition: Improved  Hospital Course: Jared Martinez is an 77 y.o. male who was admitted 07/30/2018 for operative treatment ofPainful total knee replacement, right ([place]). Patient has severe unremitting pain that affects sleep, daily activities, and work/hobbies. After pre-op clearance the patient was taken to the operating room on 07/30/2018 and underwent  Procedure(s): RIGHT TOTAL KNEE REVISION OF PATELLA AND TBIAL BEARING.    Patient was given perioperative antibiotics:  Anti-infectives (From admission, onward)   Start     Dose/Rate Route Frequency Ordered Stop   07/30/18 0600   ceFAZolin (ANCEF) 3 g in dextrose 5 % 50 mL IVPB     3 g 100 mL/hr over 30 Minutes Intravenous On call to O.R. 07/29/18 1112 07/30/18 1115       Patient was given sequential compression devices, early ambulation, and chemoprophylaxis to prevent DVT.  Patient benefited maximally from hospital stay and there were no complications.    Recent vital signs:  Patient Vitals for the past 24 hrs:  BP Temp Temp src Pulse Resp SpO2  07/31/18 2229 - - - (!) 56 20 95 %  07/31/18 2104 116/76 97.6 F (36.4 C) Oral (!) 58 20 94 %  07/31/18 1350 126/68 (!) 97.4 F (36.3 C) Oral (!) 57 16 97 %  07/31/18 0948 123/70 (!) 97.5 F (36.4 C) Oral 60 16 95 %     Recent laboratory studies:  Recent Labs    07/30/18 1434 07/31/18 0532 08/01/18 0523  WBC  --  6.5 5.8  HGB  --  12.8* 12.6*  HCT  --  38.1* 36.9*  PLT  --  69* 73*  NA 142 136  --   K 3.7 4.4  --   CL 105 106  --   CO2 26 23  --   BUN 18 21  --   CREATININE 1.31* 1.22  --   GLUCOSE 152* 259*  --   CALCIUM 8.7* 8.4*  --      Discharge Medications:   Allergies as of 08/01/2018  Reactions   Doxycycline Nausea And Vomiting   Flomax [tamsulosin] Other (See Comments)   "mouth breaks out"   Oxybutynin Nausea Only, Other (See Comments)   "severe nausea"   Adhesive [tape] Rash      Medication List    STOP taking these medications   acetaminophen 500 MG tablet Commonly known as:  TYLENOL     TAKE these medications   aspirin EC 81 MG tablet Take 1 tablet (81 mg total) by mouth 2 (two) times daily. What changed:  when to take this   cetirizine 10 MG tablet Commonly known as:  ZYRTEC Take 10 mg by mouth every morning.   citalopram 20 MG tablet Commonly known as:  CELEXA Take 20 mg by mouth at bedtime.   cycloSPORINE 0.05 % ophthalmic emulsion Commonly known as:  RESTASIS Place 1 drop into both eyes 2 (two) times daily as needed (for dry eyes).   finasteride 5 MG tablet Commonly known as:  PROSCAR Take 5 mg by  mouth every morning.   furosemide 40 MG tablet Commonly known as:  LASIX Take 40 mg by mouth at bedtime.   lisinopril 20 MG tablet Commonly known as:  PRINIVIL,ZESTRIL Take 10 mg by mouth every morning.   Melatonin 3 MG Caps Take 3 mg by mouth at bedtime.   metFORMIN 500 MG tablet Commonly known as:  GLUCOPHAGE Take 500 mg by mouth daily with breakfast.   mirtazapine 15 MG tablet Commonly known as:  REMERON Take 15 mg by mouth at bedtime.   nadolol 40 MG tablet Commonly known as:  CORGARD TAKE 1 TABLET EVERY EVENING What changed:  when to take this   OVER THE COUNTER MEDICATION Apply 1 application topically daily as needed (for infection). Emuaid Cream   oxyCODONE-acetaminophen 5-325 MG tablet Commonly known as:  PERCOCET/ROXICET Take 1 tablet by mouth every 4 (four) hours as needed for severe pain.   tiZANidine 2 MG tablet Commonly known as:  ZANAFLEX Take 1 tablet (2 mg total) by mouth every 6 (six) hours as needed.            Durable Medical Equipment  (From admission, onward)         Start     Ordered   07/30/18 1416  DME Walker rolling  Once    Question:  Patient needs a walker to treat with the following condition  Answer:  Status post right knee replacement   07/30/18 1415   07/30/18 1416  DME 3 n 1  Once     07/30/18 1415           Discharge Care Instructions  (From admission, onward)         Start     Ordered   08/01/18 0000  Weight bearing as tolerated     08/01/18 0801          Diagnostic Studies: Dg Chest 2 View  Result Date: 07/23/2018 CLINICAL DATA:  Preoperative examination prior to right knee surgery. History of hypertension, diabetes, cirrhosis, former smoker. EXAM: CHEST - 2 VIEW COMPARISON:  Chest x-ray of December 28, 2011 FINDINGS: The lungs are adequately inflated. There is linear density in the retrosternal region likely on the right which may reflect atelectasis or scarring. The heart and pulmonary vascularity are normal.  The mediastinum is normal in width. There is no pleural effusion. The bony thorax exhibits no acute abnormality. IMPRESSION: Mild chronic bronchitic-smoking related changes. Subtle increased density in the right infrahilar region as compared to  the previous study is linear in nature and likely reflect subsegmental atelectasis or scarring. Correlation with the presence or absence of any symptoms will be needed. At minimum a repeat chest x-ray in 3-4 weeks is recommended to reassess the right infrahilar-retrosternal density given the lack of any more recent studies since March of 2013. Electronically Signed   By: David  Martinique M.D.   On: 07/23/2018 16:15    Disposition: Discharge disposition: 03-Skilled Nursing Facility       Discharge Instructions    Call MD / Call 911   Complete by:  As directed    If you experience chest pain or shortness of breath, CALL 911 and be transported to the hospital emergency room.  If you develope a fever above 101 F, pus (white drainage) or increased drainage or redness at the wound, or calf pain, call your surgeon's office.   Constipation Prevention   Complete by:  As directed    Drink plenty of fluids.  Prune juice may be helpful.  You may use a stool softener, such as Colace (over the counter) 100 mg twice a day.  Use MiraLax (over the counter) for constipation as needed.   Diet - low sodium heart healthy   Complete by:  As directed    Driving restrictions   Complete by:  As directed    No driving for 2 weeks   Increase activity slowly as tolerated   Complete by:  As directed    Patient may shower   Complete by:  As directed    You may shower without a dressing once there is no drainage.  Do not wash over the wound.  If drainage remains, cover wound with plastic wrap and then shower.   Weight bearing as tolerated   Complete by:  As directed        Contact information for follow-up providers    Frederik Pear, MD In 2 weeks.   Specialty:  Orthopedic  Surgery Contact information: [place] [address] [phone]            Contact information for after-discharge care    [facility] .   Service:  Skilled Nursing Contact information: [place] [phone]                   Signed: Joanell Rising 08/01/2018, 8:02 AM

## 2018-08-01 NOTE — Progress Notes (Addendum)
Pt c/o blurred vision. Pt reported that this is a new occurrence that has continued since he had his orthopedic surgery; however, the pt reported that I was the first person he has told about the blurred vision. PT's BP is WNL, pain is tolerable, and pt did not report any headaches, SOB or chest pain. Joanell Rising, PA was notified. Currently awaiting a callback. Will continue to monitor the pt.   PA returned call and has consulted a Hospitalist.

## 2018-08-01 NOTE — Progress Notes (Signed)
Physical Therapy Treatment Patient Details Name: Jared Martinez MRN: [id number] DOB: 04/01/1941 Today's Date: 08/01/2018    History of Present Illness 78 YO male s/p R TKR revision/patella on 07/30/18. PMH includes nonalcoholic hepatic cirrhosis, DJD, HTN, HLD, anxiety, sleep apnea, depression, 1st degree AV block, LE edema, DMII. Surgical history includes R shoulder arthroscopy, R TKR 2013, L TKR 2006.     PT Comments    Pt reports knee stiffness and eager to mobilize.  Pt assisted with ambulating in hallway and requiring min assist overall for mobility today.  Pt also performed LE exercises in recliner.  Dr. Cruzita Lederer into room end of session.  Follow Up Recommendations  Follow surgeon's recommendation for DC plan and follow-up therapies;Supervision for mobility/OOB(plans for SNF)     Equipment Recommendations  None recommended by PT    Recommendations for Other Services       Precautions / Restrictions Precautions Precautions: Fall;Knee Required Braces or Orthoses: Knee Immobilizer - Right Restrictions Other Position/Activity Restrictions: WBAT     Mobility  Bed Mobility Overal bed mobility: Needs Assistance Bed Mobility: Supine to Sit     Supine to sit: Min guard     General bed mobility comments: verbal cues for self assist  Transfers Overall transfer level: Needs assistance Equipment used: Rolling walker (2 wheeled) Transfers: Sit to/from Stand Sit to Stand: Min assist         General transfer comment: verbal cues for UE and LE positioning, assist to rise and steady  Ambulation/Gait Ambulation/Gait assistance: Min guard;Min assist Gait Distance (Feet): 160 Feet Assistive device: Rolling walker (2 wheeled) Gait Pattern/deviations: Trunk flexed;Decreased stance time - right;Step-through pattern     General Gait Details: verbal cues for sequence, RW positioning, step length   Stairs             Wheelchair Mobility    Modified Rankin (Stroke  Patients Only)       Balance                                            Cognition Arousal/Alertness: Awake/alert Behavior During Therapy: WFL for tasks assessed/performed Overall Cognitive Status: Within Functional Limits for tasks assessed                                        Exercises Total Joint Exercises Ankle Circles/Pumps: AROM;Both;10 reps Quad Sets: AROM;10 reps;Both Short Arc Quad: 10 reps;Right;AAROM Heel Slides: AAROM;10 reps;Right Hip ABduction/ADduction: AROM;10 reps;Right Straight Leg Raises: 10 reps;Right;AAROM    General Comments        Pertinent Vitals/Pain Pain Assessment: 0-10 Pain Score: 5  Pain Location: R knee Pain Descriptors / Indicators: Sore;Aching Pain Intervention(s): Limited activity within patient's tolerance;Monitored during session;Repositioned;Ice applied;Premedicated before session    Home Living                      Prior Function            PT Goals (current goals can now be found in the care plan section) Progress towards PT goals: Progressing toward goals    Frequency    7X/week      PT Plan Current plan remains appropriate    Co-evaluation  AM-PAC PT "6 Clicks" Daily Activity  Outcome Measure  Difficulty turning over in bed (including adjusting bedclothes, sheets and blankets)?: A Lot Difficulty moving from lying on back to sitting on the side of the bed? : A Lot Difficulty sitting down on and standing up from a chair with arms (e.g., wheelchair, bedside commode, etc,.)?: Unable Help needed moving to and from a bed to chair (including a wheelchair)?: A Little Help needed walking in hospital room?: A Little Help needed climbing 3-5 steps with a railing? : A Lot 6 Click Score: 13    End of Session Equipment Utilized During Treatment: Gait belt Activity Tolerance: Patient tolerated treatment well Patient left: with call bell/phone within reach;in  chair(MD in room) Nurse Communication: Mobility status PT Visit Diagnosis: Other abnormalities of gait and mobility (R26.89);Difficulty in walking, not elsewhere classified (R26.2)     Time: 0964-3838 PT Time Calculation (min) (ACUTE ONLY): 24 min  Charges:  $Gait Training: 8-22 mins $Therapeutic Exercise: 8-22 mins                    Carmelia Bake, PT, DPT Acute Rehabilitation Services Office: [phone] Pager: [phone]   Trena Platt 08/01/2018, 2:03 PM

## 2018-08-02 DIAGNOSIS — K5900 Constipation, unspecified: Secondary | ICD-10-CM | POA: Diagnosis not present

## 2018-08-02 DIAGNOSIS — K219 Gastro-esophageal reflux disease without esophagitis: Secondary | ICD-10-CM | POA: Diagnosis not present

## 2018-08-02 DIAGNOSIS — M6281 Muscle weakness (generalized): Secondary | ICD-10-CM | POA: Diagnosis not present

## 2018-08-02 DIAGNOSIS — F339 Major depressive disorder, recurrent, unspecified: Secondary | ICD-10-CM | POA: Diagnosis not present

## 2018-08-02 DIAGNOSIS — I10 Essential (primary) hypertension: Secondary | ICD-10-CM | POA: Diagnosis not present

## 2018-08-02 DIAGNOSIS — R262 Difficulty in walking, not elsewhere classified: Secondary | ICD-10-CM | POA: Diagnosis not present

## 2018-08-02 DIAGNOSIS — E785 Hyperlipidemia, unspecified: Secondary | ICD-10-CM | POA: Diagnosis not present

## 2018-08-02 DIAGNOSIS — J9811 Atelectasis: Secondary | ICD-10-CM | POA: Diagnosis not present

## 2018-08-02 DIAGNOSIS — M62838 Other muscle spasm: Secondary | ICD-10-CM | POA: Diagnosis not present

## 2018-08-02 DIAGNOSIS — F418 Other specified anxiety disorders: Secondary | ICD-10-CM | POA: Diagnosis not present

## 2018-08-02 DIAGNOSIS — M1711 Unilateral primary osteoarthritis, right knee: Secondary | ICD-10-CM | POA: Diagnosis not present

## 2018-08-02 DIAGNOSIS — Z96651 Presence of right artificial knee joint: Secondary | ICD-10-CM | POA: Diagnosis not present

## 2018-08-02 DIAGNOSIS — Z471 Aftercare following joint replacement surgery: Secondary | ICD-10-CM | POA: Diagnosis not present

## 2018-08-02 DIAGNOSIS — E119 Type 2 diabetes mellitus without complications: Secondary | ICD-10-CM | POA: Diagnosis not present

## 2018-08-02 DIAGNOSIS — F419 Anxiety disorder, unspecified: Secondary | ICD-10-CM | POA: Diagnosis not present

## 2018-08-02 DIAGNOSIS — N400 Enlarged prostate without lower urinary tract symptoms: Secondary | ICD-10-CM | POA: Diagnosis not present

## 2018-08-02 DIAGNOSIS — R609 Edema, unspecified: Secondary | ICD-10-CM | POA: Diagnosis not present

## 2018-08-02 DIAGNOSIS — K7581 Nonalcoholic steatohepatitis (NASH): Secondary | ICD-10-CM | POA: Diagnosis not present

## 2018-08-02 LAB — CBC
HCT: 40.1 % (ref 39.0–52.0)
Hemoglobin: 13.3 g/dL (ref 13.0–17.0)
MCH: 34.1 pg — ABNORMAL HIGH (ref 26.0–34.0)
MCHC: 33.2 g/dL (ref 30.0–36.0)
MCV: 102.8 fL — ABNORMAL HIGH (ref 80.0–100.0)
Platelets: 80 10*3/uL — ABNORMAL LOW (ref 150–400)
RBC: 3.9 MIL/uL — ABNORMAL LOW (ref 4.22–5.81)
RDW: 13.2 % (ref 11.5–15.5)
WBC: 6.2 10*3/uL (ref 4.0–10.5)
nRBC: 0 % (ref 0.0–0.2)

## 2018-08-02 LAB — GLUCOSE, CAPILLARY
Glucose-Capillary: 105 mg/dL — ABNORMAL HIGH (ref 70–99)
Glucose-Capillary: 149 mg/dL — ABNORMAL HIGH (ref 70–99)

## 2018-08-02 NOTE — Clinical Social Work Placement (Signed)
D/C Summary sent.  Nurse call report to: [phone] "ask for unit 2"  CLINICAL SOCIAL WORK PLACEMENT  NOTE  Date:  08/02/2018  Patient Details  Name: Jared Martinez MRN: [id number] Date of Birth: November 06, 1940  Clinical Social Work is seeking post-discharge placement for this patient at the [place] level of care (*CSW will initial, date and re-position this form in  chart as items are completed):  Yes   Patient/family provided with [facility] Work Department's list of facilities offering this level of care within the geographic area requested by the patient (or if unable, by the patient's family).  Yes   Patient/family informed of their freedom to choose among providers that offer the needed level of care, that participate in Medicare, Medicaid or managed care program needed by the patient, have an available bed and are willing to accept the patient.  Yes   Patient/family informed of [facility]'s ownership interest in [facility] and [facility], as well as of the fact that they are under no obligation to receive care at these facilities.  PASRR submitted to EDS on       PASRR number received on       Existing PASRR number confirmed on       FL2 transmitted to all facilities in geographic area requested by pt/family on       FL2 transmitted to all facilities within larger geographic area on 08/02/18     Patient informed that his/her managed care company has contracts with or will negotiate with certain facilities, including the following:        Yes   Patient/family informed of bed offers received.  Patient chooses bed at [facility] and [facility]     Physician recommends and patient chooses bed at      Patient to be transferred to [facility] and [facility] on 08/02/18.  Patient to be transferred to facility by [facility]     Patient family notified on 08/02/18 of transfer.  Name of family member notified:  Daughter  Helene Kelp     PHYSICIAN       Additional Comment:    _______________________________________________ Lia Hopping, LCSW 08/02/2018, 10:59 AM

## 2018-08-02 NOTE — Care Management Important Message (Signed)
Important Message  Patient Details  Name: ARNO CULLERS MRN: [id number] Date of Birth: Nov 01, 1940   Medicare Important Message Given:  Yes    [name] 08/02/2018, 12:54 [place] Message  Patient Details  Name: ANTONY SIAN MRN: [id number] Date of Birth: Dec 11, 1940   Medicare Important Message Given:  Yes    [name] 08/02/2018, 12:54 PM

## 2018-08-02 NOTE — Progress Notes (Signed)
PATIENT ID: Jared Martinez  MRN: [id number]  DOB/AGE:  [birth year] / 77 y.o.  3 Days Post-Op Procedure(s) (LRB): RIGHT TOTAL KNEE REVISION OF PATELLA AND TBIAL BEARING (Right)    PROGRESS NOTE Subjective: Patient is alert, oriented, no Nausea, no Vomiting, yes passing gas. Taking PO well. Denies SOB, Chest or Calf Pain. Using Incentive Spirometer, PAS in place. Ambulate 160, Patient reports pain as 2/10. Brain MRI no acute changes   Objective: Vital signs in last 24 hours: Vitals:   08/01/18 0810 08/01/18 1511 08/01/18 2354 08/02/18 0526  BP: 127/70 122/69 (Abnormal) 106/59 121/63  Pulse: (Abnormal) 57 (Abnormal) 57 (Abnormal) 58 (Abnormal) 56  Resp:  16 16 16   Temp:  (Abnormal) 97.4 F (36.3 C) 98.3 F (36.8 C)   TempSrc:  Oral Oral   SpO2:  95% 93% 96%  Weight:      Height:          Intake/Output from previous day: I/O last 3 completed shifts: In: 1620 [P.O.:1620] Out: 2500 [Urine:2500]   Intake/Output this shift: Total I/O In: 120 [P.O.:120] Out: 500 [Urine:500]   LABORATORY DATA: Recent Labs    07/30/18 1434  07/31/18 0532  08/01/18 0523  08/01/18 1719 08/01/18 2221 08/02/18 0521 08/02/18 0721  WBC  --    < > 6.5  --  5.8  --   --   --  6.2  --   HGB  --    < > 12.8*  --  12.6*  --   --   --  13.3  --   HCT  --    < > 38.1*  --  36.9*  --   --   --  40.1  --   PLT  --    < > 69*  --  73*  --   --   --  80*  --   NA 142  --  136  --   --   --   --   --   --   --   K 3.7  --  4.4  --   --   --   --   --   --   --   CL 105  --  106  --   --   --   --   --   --   --   CO2 26  --  23  --   --   --   --   --   --   --   BUN 18  --  21  --   --   --   --   --   --   --   CREATININE 1.31*  --  1.22  --   --   --   --   --   --   --   GLUCOSE 152*  --  259*  --   --   --   --   --   --   --   GLUCAP  --    < >  --    < >  --    < > 147* 136*  --  105*  CALCIUM 8.7*  --  8.4*  --   --   --   --   --   --   --    < > = values in this interval not displayed.     Examination: Neurologically intact ABD soft Neurovascular intact Sensation intact distally  Intact pulses distally Dorsiflexion/Plantar flexion intact Incision: scant drainage and moderate drainage No cellulitis present Compartment soft} New Aquacil applied  Assessment:   3 Days Post-Op Procedure(s) (LRB): RIGHT TOTAL KNEE REVISION OF PATELLA AND TBIAL BEARING (Right) ADDITIONAL DIAGNOSIS: Expected Acute Blood Loss Anemia, Hypertension Anticipated LOS equal to or greater than 2 midnights due to - Age 79 and older with one or more of the following:  - Obesity  - Expected need for hospital services (PT, OT, Nursing) required for safe  discharge  - Anticipated need for postoperative skilled nursing care or inpatient rehab  - Cirrhosis, OSA, HTN OR    - Patient is a high risk of re-admission due to: Barriers to post-acute care (logistical, no family support in home)    Plan: PT/OT WBAT, AROM and PROM  DVT Prophylaxis:  SCDx72hrs, ASA 81 mg BID x 2 weeks DISCHARGE PLAN: Skilled Nursing Facility/Rehab, today DISCHARGE NEEDS: HHPT, Walker and 3-in-1 comode seat     [name] 08/02/2018, 9:52 AM

## 2018-08-02 NOTE — Progress Notes (Signed)
Physical Therapy Treatment Patient Details Name: Jared Martinez MRN: [id number] DOB: 1941-04-17 Today's Date: 08/02/2018    History of Present Illness 77 YO male s/p R TKR revision/patella on 07/30/18. PMH includes nonalcoholic hepatic cirrhosis, DJD, HTN, HLD, anxiety, sleep apnea, depression, 1st degree AV block, LE edema, DMII. Surgical history includes R shoulder arthroscopy, R TKR 2013, L TKR 2006.     PT Comments    Pt ambulated in hallway and performed LE exercises.  Pt reports d/c to SNF today.   Follow Up Recommendations  Follow surgeon's recommendation for DC plan and follow-up therapies;Supervision for mobility/OOB     Equipment Recommendations  None recommended by PT    Recommendations for Other Services       Precautions / Restrictions Precautions Precautions: Fall;Knee Required Braces or Orthoses: Knee Immobilizer - Right Restrictions Other Position/Activity Restrictions: WBAT     Mobility  Bed Mobility               General bed mobility comments: pt up in recliner  Transfers Overall transfer level: Needs assistance Equipment used: Rolling walker (2 wheeled) Transfers: Sit to/from Stand Sit to Stand: Min guard         General transfer comment: verbal cues for UE and LE positioning, increased time and effort  Ambulation/Gait Ambulation/Gait assistance: Min guard;Min assist Gait Distance (Feet): 200 Feet Assistive device: Rolling walker (2 wheeled) Gait Pattern/deviations: Trunk flexed;Decreased stance time - right;Step-through pattern     General Gait Details: verbal cues for sequence, RW positioning, step length; pt required 3 short standing rest breaks for fatigue   Stairs             Wheelchair Mobility    Modified Rankin (Stroke Patients Only)       Balance                                            Cognition Arousal/Alertness: Awake/alert Behavior During Therapy: WFL for tasks  assessed/performed Overall Cognitive Status: Within Functional Limits for tasks assessed                                        Exercises Total Joint Exercises Ankle Circles/Pumps: AROM;Both;10 reps Quad Sets: AROM;10 reps;Both Heel Slides: AAROM;10 reps;Right Hip ABduction/ADduction: AROM;10 reps;Right Straight Leg Raises: 10 reps;Right;AAROM    General Comments        Pertinent Vitals/Pain Pain Assessment: 0-10 Pain Score: 5  Pain Location: R knee Pain Descriptors / Indicators: Sore Pain Intervention(s): Limited activity within patient's tolerance;Repositioned;Monitored during session(student RN filling ice bags)    Home Living                      Prior Function            PT Goals (current goals can now be found in the care plan section) Progress towards PT goals: Progressing toward goals    Frequency    7X/week      PT Plan Current plan remains appropriate    Co-evaluation              AM-PAC PT "6 Clicks" Daily Activity  Outcome Measure  Difficulty turning over in bed (including adjusting bedclothes, sheets and blankets)?: A Lot Difficulty moving from lying on back to sitting  on the side of the bed? : A Lot Difficulty sitting down on and standing up from a chair with arms (e.g., wheelchair, bedside commode, etc,.)?: A Lot Help needed moving to and from a bed to chair (including a wheelchair)?: A Little Help needed walking in hospital room?: A Little Help needed climbing 3-5 steps with a railing? : A Lot 6 Click Score: 14    End of Session Equipment Utilized During Treatment: Gait belt Activity Tolerance: Patient tolerated treatment well Patient left: with call bell/phone within reach;in chair(with student RN to get bathed)   PT Visit Diagnosis: Other abnormalities of gait and mobility (R26.89);Difficulty in walking, not elsewhere classified (R26.2)     Time: 7482-7078 PT Time Calculation (min) (ACUTE ONLY): 25  min  Charges:  $Gait Training: 8-22 mins $Therapeutic Exercise: 8-22 mins                     Carmelia Bake, PT, DPT Acute Rehabilitation Services Office: [phone] Pager: [phone]  Trena Platt 08/02/2018, 4:34 PM

## 2018-08-03 DIAGNOSIS — E119 Type 2 diabetes mellitus without complications: Secondary | ICD-10-CM | POA: Diagnosis not present

## 2018-08-03 DIAGNOSIS — K219 Gastro-esophageal reflux disease without esophagitis: Secondary | ICD-10-CM | POA: Diagnosis not present

## 2018-08-03 DIAGNOSIS — M1711 Unilateral primary osteoarthritis, right knee: Secondary | ICD-10-CM | POA: Diagnosis not present

## 2018-08-03 DIAGNOSIS — F418 Other specified anxiety disorders: Secondary | ICD-10-CM | POA: Diagnosis not present

## 2018-08-06 DIAGNOSIS — E119 Type 2 diabetes mellitus without complications: Secondary | ICD-10-CM | POA: Diagnosis not present

## 2018-08-06 DIAGNOSIS — M1711 Unilateral primary osteoarthritis, right knee: Secondary | ICD-10-CM | POA: Diagnosis not present

## 2018-08-06 DIAGNOSIS — F418 Other specified anxiety disorders: Secondary | ICD-10-CM | POA: Diagnosis not present

## 2018-08-06 DIAGNOSIS — I10 Essential (primary) hypertension: Secondary | ICD-10-CM | POA: Diagnosis not present

## 2018-08-14 DIAGNOSIS — Z96651 Presence of right artificial knee joint: Secondary | ICD-10-CM | POA: Diagnosis not present

## 2018-08-14 DIAGNOSIS — M1711 Unilateral primary osteoarthritis, right knee: Secondary | ICD-10-CM | POA: Diagnosis not present

## 2018-08-14 DIAGNOSIS — Z471 Aftercare following joint replacement surgery: Secondary | ICD-10-CM | POA: Diagnosis not present

## 2018-08-23 DIAGNOSIS — K219 Gastro-esophageal reflux disease without esophagitis: Secondary | ICD-10-CM | POA: Diagnosis not present

## 2018-08-23 DIAGNOSIS — I10 Essential (primary) hypertension: Secondary | ICD-10-CM | POA: Diagnosis not present

## 2018-08-23 DIAGNOSIS — E119 Type 2 diabetes mellitus without complications: Secondary | ICD-10-CM | POA: Diagnosis not present

## 2018-08-23 DIAGNOSIS — M1711 Unilateral primary osteoarthritis, right knee: Secondary | ICD-10-CM | POA: Diagnosis not present

## 2018-09-05 DIAGNOSIS — M6281 Muscle weakness (generalized): Secondary | ICD-10-CM | POA: Diagnosis not present

## 2018-09-05 DIAGNOSIS — Z96651 Presence of right artificial knee joint: Secondary | ICD-10-CM | POA: Diagnosis not present

## 2018-09-05 DIAGNOSIS — R262 Difficulty in walking, not elsewhere classified: Secondary | ICD-10-CM | POA: Diagnosis not present

## 2018-09-05 DIAGNOSIS — Z471 Aftercare following joint replacement surgery: Secondary | ICD-10-CM | POA: Diagnosis not present

## 2018-09-05 DIAGNOSIS — M25561 Pain in right knee: Secondary | ICD-10-CM | POA: Diagnosis not present

## 2018-09-05 DIAGNOSIS — R2689 Other abnormalities of gait and mobility: Secondary | ICD-10-CM | POA: Diagnosis not present

## 2018-09-05 DIAGNOSIS — M2560 Stiffness of unspecified joint, not elsewhere classified: Secondary | ICD-10-CM | POA: Diagnosis not present

## 2018-09-10 DIAGNOSIS — Z96651 Presence of right artificial knee joint: Secondary | ICD-10-CM | POA: Diagnosis not present

## 2018-09-10 DIAGNOSIS — M2560 Stiffness of unspecified joint, not elsewhere classified: Secondary | ICD-10-CM | POA: Diagnosis not present

## 2018-09-10 DIAGNOSIS — R2689 Other abnormalities of gait and mobility: Secondary | ICD-10-CM | POA: Diagnosis not present

## 2018-09-10 DIAGNOSIS — M6281 Muscle weakness (generalized): Secondary | ICD-10-CM | POA: Diagnosis not present

## 2018-09-10 DIAGNOSIS — R262 Difficulty in walking, not elsewhere classified: Secondary | ICD-10-CM | POA: Diagnosis not present

## 2018-09-10 DIAGNOSIS — Z471 Aftercare following joint replacement surgery: Secondary | ICD-10-CM | POA: Diagnosis not present

## 2018-09-10 DIAGNOSIS — M25561 Pain in right knee: Secondary | ICD-10-CM | POA: Diagnosis not present

## 2018-09-11 DIAGNOSIS — M25561 Pain in right knee: Secondary | ICD-10-CM | POA: Diagnosis not present

## 2018-09-12 DIAGNOSIS — Z471 Aftercare following joint replacement surgery: Secondary | ICD-10-CM | POA: Diagnosis not present

## 2018-09-12 DIAGNOSIS — Z96651 Presence of right artificial knee joint: Secondary | ICD-10-CM | POA: Diagnosis not present

## 2018-09-12 DIAGNOSIS — R262 Difficulty in walking, not elsewhere classified: Secondary | ICD-10-CM | POA: Diagnosis not present

## 2018-09-12 DIAGNOSIS — M25561 Pain in right knee: Secondary | ICD-10-CM | POA: Diagnosis not present

## 2018-09-12 DIAGNOSIS — M6281 Muscle weakness (generalized): Secondary | ICD-10-CM | POA: Diagnosis not present

## 2018-09-12 DIAGNOSIS — M2560 Stiffness of unspecified joint, not elsewhere classified: Secondary | ICD-10-CM | POA: Diagnosis not present

## 2018-09-12 DIAGNOSIS — R2689 Other abnormalities of gait and mobility: Secondary | ICD-10-CM | POA: Diagnosis not present

## 2018-09-17 DIAGNOSIS — R262 Difficulty in walking, not elsewhere classified: Secondary | ICD-10-CM | POA: Diagnosis not present

## 2018-09-17 DIAGNOSIS — M2560 Stiffness of unspecified joint, not elsewhere classified: Secondary | ICD-10-CM | POA: Diagnosis not present

## 2018-09-17 DIAGNOSIS — Z96651 Presence of right artificial knee joint: Secondary | ICD-10-CM | POA: Diagnosis not present

## 2018-09-17 DIAGNOSIS — M6281 Muscle weakness (generalized): Secondary | ICD-10-CM | POA: Diagnosis not present

## 2018-09-17 DIAGNOSIS — R2689 Other abnormalities of gait and mobility: Secondary | ICD-10-CM | POA: Diagnosis not present

## 2018-09-17 DIAGNOSIS — Z471 Aftercare following joint replacement surgery: Secondary | ICD-10-CM | POA: Diagnosis not present

## 2018-09-17 DIAGNOSIS — M25561 Pain in right knee: Secondary | ICD-10-CM | POA: Diagnosis not present

## 2018-09-18 DIAGNOSIS — Z471 Aftercare following joint replacement surgery: Secondary | ICD-10-CM | POA: Diagnosis not present

## 2018-09-18 DIAGNOSIS — E7800 Pure hypercholesterolemia, unspecified: Secondary | ICD-10-CM | POA: Diagnosis not present

## 2018-09-18 DIAGNOSIS — M6281 Muscle weakness (generalized): Secondary | ICD-10-CM | POA: Diagnosis not present

## 2018-09-18 DIAGNOSIS — Z96651 Presence of right artificial knee joint: Secondary | ICD-10-CM | POA: Diagnosis not present

## 2018-09-18 DIAGNOSIS — R2689 Other abnormalities of gait and mobility: Secondary | ICD-10-CM | POA: Diagnosis not present

## 2018-09-18 DIAGNOSIS — M159 Polyosteoarthritis, unspecified: Secondary | ICD-10-CM | POA: Diagnosis not present

## 2018-09-18 DIAGNOSIS — E119 Type 2 diabetes mellitus without complications: Secondary | ICD-10-CM | POA: Diagnosis not present

## 2018-09-18 DIAGNOSIS — I10 Essential (primary) hypertension: Secondary | ICD-10-CM | POA: Diagnosis not present

## 2018-09-18 DIAGNOSIS — M2560 Stiffness of unspecified joint, not elsewhere classified: Secondary | ICD-10-CM | POA: Diagnosis not present

## 2018-09-18 DIAGNOSIS — R262 Difficulty in walking, not elsewhere classified: Secondary | ICD-10-CM | POA: Diagnosis not present

## 2018-09-18 DIAGNOSIS — M25561 Pain in right knee: Secondary | ICD-10-CM | POA: Diagnosis not present

## 2018-09-21 DIAGNOSIS — R262 Difficulty in walking, not elsewhere classified: Secondary | ICD-10-CM | POA: Diagnosis not present

## 2018-09-21 DIAGNOSIS — Z96651 Presence of right artificial knee joint: Secondary | ICD-10-CM | POA: Diagnosis not present

## 2018-09-21 DIAGNOSIS — R2689 Other abnormalities of gait and mobility: Secondary | ICD-10-CM | POA: Diagnosis not present

## 2018-09-21 DIAGNOSIS — M25561 Pain in right knee: Secondary | ICD-10-CM | POA: Diagnosis not present

## 2018-09-21 DIAGNOSIS — Z471 Aftercare following joint replacement surgery: Secondary | ICD-10-CM | POA: Diagnosis not present

## 2018-09-21 DIAGNOSIS — M2560 Stiffness of unspecified joint, not elsewhere classified: Secondary | ICD-10-CM | POA: Diagnosis not present

## 2018-09-21 DIAGNOSIS — M6281 Muscle weakness (generalized): Secondary | ICD-10-CM | POA: Diagnosis not present

## 2018-09-24 DIAGNOSIS — M2560 Stiffness of unspecified joint, not elsewhere classified: Secondary | ICD-10-CM | POA: Diagnosis not present

## 2018-09-24 DIAGNOSIS — R2689 Other abnormalities of gait and mobility: Secondary | ICD-10-CM | POA: Diagnosis not present

## 2018-09-24 DIAGNOSIS — Z96651 Presence of right artificial knee joint: Secondary | ICD-10-CM | POA: Diagnosis not present

## 2018-09-24 DIAGNOSIS — R262 Difficulty in walking, not elsewhere classified: Secondary | ICD-10-CM | POA: Diagnosis not present

## 2018-09-24 DIAGNOSIS — Z471 Aftercare following joint replacement surgery: Secondary | ICD-10-CM | POA: Diagnosis not present

## 2018-09-24 DIAGNOSIS — M25561 Pain in right knee: Secondary | ICD-10-CM | POA: Diagnosis not present

## 2018-09-24 DIAGNOSIS — M6281 Muscle weakness (generalized): Secondary | ICD-10-CM | POA: Diagnosis not present

## 2018-09-26 DIAGNOSIS — L03115 Cellulitis of right lower limb: Secondary | ICD-10-CM | POA: Diagnosis not present

## 2018-09-26 DIAGNOSIS — M6281 Muscle weakness (generalized): Secondary | ICD-10-CM | POA: Diagnosis not present

## 2018-09-26 DIAGNOSIS — M2560 Stiffness of unspecified joint, not elsewhere classified: Secondary | ICD-10-CM | POA: Diagnosis not present

## 2018-09-26 DIAGNOSIS — R2689 Other abnormalities of gait and mobility: Secondary | ICD-10-CM | POA: Diagnosis not present

## 2018-09-26 DIAGNOSIS — M25561 Pain in right knee: Secondary | ICD-10-CM | POA: Diagnosis not present

## 2018-09-26 DIAGNOSIS — Z96651 Presence of right artificial knee joint: Secondary | ICD-10-CM | POA: Diagnosis not present

## 2018-09-26 DIAGNOSIS — R262 Difficulty in walking, not elsewhere classified: Secondary | ICD-10-CM | POA: Diagnosis not present

## 2018-09-26 DIAGNOSIS — Z471 Aftercare following joint replacement surgery: Secondary | ICD-10-CM | POA: Diagnosis not present

## 2018-09-28 ENCOUNTER — Telehealth: Payer: Self-pay | Admitting: Gastroenterology

## 2018-09-28 DIAGNOSIS — M6281 Muscle weakness (generalized): Secondary | ICD-10-CM | POA: Diagnosis not present

## 2018-09-28 DIAGNOSIS — M25561 Pain in right knee: Secondary | ICD-10-CM | POA: Diagnosis not present

## 2018-09-28 DIAGNOSIS — Z96651 Presence of right artificial knee joint: Secondary | ICD-10-CM | POA: Diagnosis not present

## 2018-09-28 DIAGNOSIS — M2560 Stiffness of unspecified joint, not elsewhere classified: Secondary | ICD-10-CM | POA: Diagnosis not present

## 2018-09-28 DIAGNOSIS — R2689 Other abnormalities of gait and mobility: Secondary | ICD-10-CM | POA: Diagnosis not present

## 2018-09-28 DIAGNOSIS — Z471 Aftercare following joint replacement surgery: Secondary | ICD-10-CM | POA: Diagnosis not present

## 2018-09-28 DIAGNOSIS — R262 Difficulty in walking, not elsewhere classified: Secondary | ICD-10-CM | POA: Diagnosis not present

## 2018-09-28 NOTE — Telephone Encounter (Signed)
Per last RUQ will arrange in 6 months at next OV. Patient scheduled 10/25/18.

## 2018-09-28 NOTE — Telephone Encounter (Signed)
RECALL FOR ULTRASOUND 

## 2018-10-01 DIAGNOSIS — M2560 Stiffness of unspecified joint, not elsewhere classified: Secondary | ICD-10-CM | POA: Diagnosis not present

## 2018-10-01 DIAGNOSIS — M6281 Muscle weakness (generalized): Secondary | ICD-10-CM | POA: Diagnosis not present

## 2018-10-01 DIAGNOSIS — Z96651 Presence of right artificial knee joint: Secondary | ICD-10-CM | POA: Diagnosis not present

## 2018-10-01 DIAGNOSIS — Z471 Aftercare following joint replacement surgery: Secondary | ICD-10-CM | POA: Diagnosis not present

## 2018-10-01 DIAGNOSIS — M25561 Pain in right knee: Secondary | ICD-10-CM | POA: Diagnosis not present

## 2018-10-01 DIAGNOSIS — R2689 Other abnormalities of gait and mobility: Secondary | ICD-10-CM | POA: Diagnosis not present

## 2018-10-01 DIAGNOSIS — R262 Difficulty in walking, not elsewhere classified: Secondary | ICD-10-CM | POA: Diagnosis not present

## 2018-10-03 DIAGNOSIS — R2689 Other abnormalities of gait and mobility: Secondary | ICD-10-CM | POA: Diagnosis not present

## 2018-10-03 DIAGNOSIS — Z471 Aftercare following joint replacement surgery: Secondary | ICD-10-CM | POA: Diagnosis not present

## 2018-10-03 DIAGNOSIS — Z96651 Presence of right artificial knee joint: Secondary | ICD-10-CM | POA: Diagnosis not present

## 2018-10-03 DIAGNOSIS — M2560 Stiffness of unspecified joint, not elsewhere classified: Secondary | ICD-10-CM | POA: Diagnosis not present

## 2018-10-03 DIAGNOSIS — M6281 Muscle weakness (generalized): Secondary | ICD-10-CM | POA: Diagnosis not present

## 2018-10-03 DIAGNOSIS — M25561 Pain in right knee: Secondary | ICD-10-CM | POA: Diagnosis not present

## 2018-10-03 DIAGNOSIS — R262 Difficulty in walking, not elsewhere classified: Secondary | ICD-10-CM | POA: Diagnosis not present

## 2018-10-04 DIAGNOSIS — R262 Difficulty in walking, not elsewhere classified: Secondary | ICD-10-CM | POA: Diagnosis not present

## 2018-10-04 DIAGNOSIS — M2560 Stiffness of unspecified joint, not elsewhere classified: Secondary | ICD-10-CM | POA: Diagnosis not present

## 2018-10-04 DIAGNOSIS — Z96651 Presence of right artificial knee joint: Secondary | ICD-10-CM | POA: Diagnosis not present

## 2018-10-04 DIAGNOSIS — R2689 Other abnormalities of gait and mobility: Secondary | ICD-10-CM | POA: Diagnosis not present

## 2018-10-04 DIAGNOSIS — M6281 Muscle weakness (generalized): Secondary | ICD-10-CM | POA: Diagnosis not present

## 2018-10-04 DIAGNOSIS — Z471 Aftercare following joint replacement surgery: Secondary | ICD-10-CM | POA: Diagnosis not present

## 2018-10-04 DIAGNOSIS — M25561 Pain in right knee: Secondary | ICD-10-CM | POA: Diagnosis not present

## 2018-10-08 DIAGNOSIS — M25561 Pain in right knee: Secondary | ICD-10-CM | POA: Diagnosis not present

## 2018-10-08 DIAGNOSIS — M2560 Stiffness of unspecified joint, not elsewhere classified: Secondary | ICD-10-CM | POA: Diagnosis not present

## 2018-10-08 DIAGNOSIS — Z96651 Presence of right artificial knee joint: Secondary | ICD-10-CM | POA: Diagnosis not present

## 2018-10-08 DIAGNOSIS — R2689 Other abnormalities of gait and mobility: Secondary | ICD-10-CM | POA: Diagnosis not present

## 2018-10-08 DIAGNOSIS — M6281 Muscle weakness (generalized): Secondary | ICD-10-CM | POA: Diagnosis not present

## 2018-10-08 DIAGNOSIS — Z471 Aftercare following joint replacement surgery: Secondary | ICD-10-CM | POA: Diagnosis not present

## 2018-10-08 DIAGNOSIS — R262 Difficulty in walking, not elsewhere classified: Secondary | ICD-10-CM | POA: Diagnosis not present

## 2018-10-11 DIAGNOSIS — M25561 Pain in right knee: Secondary | ICD-10-CM | POA: Diagnosis not present

## 2018-10-11 DIAGNOSIS — M2560 Stiffness of unspecified joint, not elsewhere classified: Secondary | ICD-10-CM | POA: Diagnosis not present

## 2018-10-11 DIAGNOSIS — R2689 Other abnormalities of gait and mobility: Secondary | ICD-10-CM | POA: Diagnosis not present

## 2018-10-11 DIAGNOSIS — R262 Difficulty in walking, not elsewhere classified: Secondary | ICD-10-CM | POA: Diagnosis not present

## 2018-10-11 DIAGNOSIS — Z471 Aftercare following joint replacement surgery: Secondary | ICD-10-CM | POA: Diagnosis not present

## 2018-10-11 DIAGNOSIS — Z96651 Presence of right artificial knee joint: Secondary | ICD-10-CM | POA: Diagnosis not present

## 2018-10-11 DIAGNOSIS — M6281 Muscle weakness (generalized): Secondary | ICD-10-CM | POA: Diagnosis not present

## 2018-10-12 DIAGNOSIS — Z96651 Presence of right artificial knee joint: Secondary | ICD-10-CM | POA: Diagnosis not present

## 2018-10-12 DIAGNOSIS — M6281 Muscle weakness (generalized): Secondary | ICD-10-CM | POA: Diagnosis not present

## 2018-10-12 DIAGNOSIS — R262 Difficulty in walking, not elsewhere classified: Secondary | ICD-10-CM | POA: Diagnosis not present

## 2018-10-12 DIAGNOSIS — R2689 Other abnormalities of gait and mobility: Secondary | ICD-10-CM | POA: Diagnosis not present

## 2018-10-12 DIAGNOSIS — M25561 Pain in right knee: Secondary | ICD-10-CM | POA: Diagnosis not present

## 2018-10-12 DIAGNOSIS — M2560 Stiffness of unspecified joint, not elsewhere classified: Secondary | ICD-10-CM | POA: Diagnosis not present

## 2018-10-12 DIAGNOSIS — Z471 Aftercare following joint replacement surgery: Secondary | ICD-10-CM | POA: Diagnosis not present

## 2018-10-13 DIAGNOSIS — J189 Pneumonia, unspecified organism: Secondary | ICD-10-CM | POA: Diagnosis not present

## 2018-10-16 DIAGNOSIS — J189 Pneumonia, unspecified organism: Secondary | ICD-10-CM | POA: Diagnosis not present

## 2018-10-19 DIAGNOSIS — F329 Major depressive disorder, single episode, unspecified: Secondary | ICD-10-CM | POA: Diagnosis not present

## 2018-10-19 DIAGNOSIS — Z6838 Body mass index (BMI) 38.0-38.9, adult: Secondary | ICD-10-CM | POA: Diagnosis not present

## 2018-10-19 DIAGNOSIS — J189 Pneumonia, unspecified organism: Secondary | ICD-10-CM | POA: Diagnosis not present

## 2018-10-19 DIAGNOSIS — Z87891 Personal history of nicotine dependence: Secondary | ICD-10-CM | POA: Diagnosis not present

## 2018-10-19 DIAGNOSIS — Z299 Encounter for prophylactic measures, unspecified: Secondary | ICD-10-CM | POA: Diagnosis not present

## 2018-10-19 DIAGNOSIS — I10 Essential (primary) hypertension: Secondary | ICD-10-CM | POA: Diagnosis not present

## 2018-10-25 ENCOUNTER — Encounter: Payer: Self-pay | Admitting: Gastroenterology

## 2018-10-25 ENCOUNTER — Ambulatory Visit (INDEPENDENT_AMBULATORY_CARE_PROVIDER_SITE_OTHER): Payer: Medicare Other | Admitting: Gastroenterology

## 2018-10-25 VITALS — BP 154/74 | HR 56 | Temp 97.2°F | Ht 71.0 in | Wt 267.2 lb

## 2018-10-25 DIAGNOSIS — M159 Polyosteoarthritis, unspecified: Secondary | ICD-10-CM | POA: Diagnosis not present

## 2018-10-25 DIAGNOSIS — E119 Type 2 diabetes mellitus without complications: Secondary | ICD-10-CM | POA: Diagnosis not present

## 2018-10-25 DIAGNOSIS — K7460 Unspecified cirrhosis of liver: Secondary | ICD-10-CM | POA: Diagnosis not present

## 2018-10-25 DIAGNOSIS — E7800 Pure hypercholesterolemia, unspecified: Secondary | ICD-10-CM | POA: Diagnosis not present

## 2018-10-25 DIAGNOSIS — I10 Essential (primary) hypertension: Secondary | ICD-10-CM | POA: Diagnosis not present

## 2018-10-25 NOTE — Patient Instructions (Signed)
We have ordered the routine ultrasound.  Let me know if the discomfort continues, and I will order more extensive imaging.  We will see you in 6 months!  I enjoyed seeing you again today! As you know, I value our relationship and want to provide genuine, compassionate, and quality care. I welcome your feedback. If you receive a survey regarding your visit,  I greatly appreciate you taking time to fill this out. See you next time!  Annitta Needs, PhD, ANP-BC [facility]

## 2018-10-25 NOTE — Assessment & Plan Note (Signed)
78 year old very pleasant male with likely NASH cirrhosis, fairly well compensated. Recently underwent knee replacement and did well. Needs [place] now. Remains on Nadolol for bleeding prophylaxis. Reports lower abdominal discomfort when sitting down, suprapubic region. No obvious acute findings on exam; he does have a chronic umbilical hernia, but this is not the site of discomfort. Discussed looser fitting clothes, call if no improvement. Ordering RUQ ultrasound for routine purposes. Will request labs from Dr. Manuella Ghazi to have on file. Return in 6 months.

## 2018-10-25 NOTE — Progress Notes (Addendum)
REVIEWED-NO ADDITIONAL RECOMMENDATIONS.  Referring Provider: Monico Blitz, MD Primary Care Physician:  Monico Blitz, MD  Primary GI: Dr. Oneida Alar   Chief Complaint  Patient presents with  . Cirrhosis    f/u    HPI:   Jared Martinez is a 77 y.o. male presenting today with a history of likely NASH cirrhosis. Previously followed by Dr. Britta Mccreedy. Established care with [facility] in July 2018.Labs with negative Hep B surface antibody and core antibody, Hep C antibody negative. ANA negative. Ferritin 84. Hep A total antibody negative. Alpha-1 antitrypsin normal at 112. Hep B surface antigen negative.Completed Hep A and B vaccination in the past.Last colonoscopy in 2016 by Dr. Britta Mccreedy with multiple sessile polyps (tubular adenomas), diverticulosis in left colon, small angiodysplastic lesion in transverse and ascending colon. Last EGD in Sept 2017 with 3 columns of medium-sized varices without stigmata of bleeding. Otherwise normal. On Nadolol for bleeding prophylaxis.    Notes pain in lower abdomen intermittently, exacerbated with movement, usually happens when first sits down.  No constipation. No rectal bleeding. No changes from urinary baseline. Has chronic incontinence. No fever or chills. No N/V. Just getting over pneumonia. Eating "off and on". Appetite is "so/so", chronic. Feels like abdomen is tight.   Past Medical History:  Diagnosis Date  . Anxiety   . Arthritis   . Bilateral edema of lower extremity    right > left --- per pt cellulitis right lower leg 2016 , has had swelling since  . BPH (benign prostatic hyperplasia)   . Depression   . First degree AV block   . Hepatic cirrhosis, unspecified hepatic cirrhosis type ([place]) dx 2016   per pt non-alcoholic fatty liver---  pt established care w/ dr fields [facility])--  04-26-2017  liver ultrasound --  cirrohoisis ,  no asites  . History of adenomatous polyp of colon    2016  tubular adenoma's  (per dr fields note)  .  Hyperlipidemia   . Hypertension   . Lower urinary tract symptoms (LUTS)   . Nocturia   . OSA (obstructive sleep apnea)    per pt has CPAP but has not used since Jan 2018-  hx sleep apnea surgery's  . RBBB (right bundle branch block)   . Type 2 diabetes mellitus ([place])    followed by pcp    Past Surgical History:  Procedure Laterality Date  . APPENDECTOMY  teen  . CATARACT EXTRACTION W/ INTRAOCULAR LENS  IMPLANT, BILATERAL  2017  . COLONOSCOPY  2016   multiple sessile polyps (tubular adenomas), diverticulosis in left colon, small angiodysplastic lesion in transverse and ascending colon.  . CYSTOSCOPY N/A 05/04/2017   Procedure: Evie Lacks DILITATION;  Surgeon: Irine Seal, MD;  Location: [facility];  Service: Urology;  Laterality: N/A;  . ESOPHAGOGASTRODUODENOSCOPY  06/2016   3 columns of medium-sized varices without stigmata of bleeding. Otherwise normal.  . NASAL SEPTUM SURGERY  x2  last one yrs ago  . PILONIDAL CYST EXCISION  x3  last one 1970's  . SHOULDER ARTHROSCOPY Right 2010 approx.  Marland Kitchen TOTAL KNEE ARTHROPLASTY  01/02/2012   Procedure: TOTAL KNEE ARTHROPLASTY;  Surgeon: Lorn Junes, MD;  Location: [facility];  Service: Orthopedics;  Laterality: Right;  . TOTAL KNEE ARTHROPLASTY Left [place] , [place]  . TOTAL KNEE REVISION Right 07/30/2018   Procedure: RIGHT TOTAL KNEE REVISION OF PATELLA AND TBIAL BEARING;  Surgeon: Frederik Pear, MD;  Location: [facility];  Service: Orthopedics;  Laterality: Right;  Adductor Block  . TRANSURETHRAL RESECTION OF PROSTATE N/A 05/04/2017   Procedure: TRANSURETHRAL RESECTION  OF THE PROSTATE;  Surgeon: Irine Seal, MD;  Location: [facility];  Service: Urology;  Laterality: N/A;  . UVULOPALATOPHARYNGOPLASTY  ?unknown date   w/  bilateral Tonsillectomy    Current Outpatient Medications  Medication Sig Dispense Refill  . aspirin EC 81 MG tablet Take 1 tablet (81 mg total) by mouth 2 (two) times daily. 60 tablet 0   . cetirizine (ZYRTEC) 10 MG tablet Take 10 mg by mouth every morning.     . citalopram (CELEXA) 20 MG tablet Take 20 mg by mouth at bedtime.     . finasteride (PROSCAR) 5 MG tablet Take 5 mg by mouth every morning.     . fluticasone furoate-vilanterol (BREO ELLIPTA) 100-25 MCG/INH AEPB Inhale 1 puff into the lungs 2 (two) times daily.    . furosemide (LASIX) 40 MG tablet Take 40 mg by mouth at bedtime.     Marland Kitchen lisinopril (PRINIVIL,ZESTRIL) 20 MG tablet Take 10 mg by mouth every morning.     . Melatonin 3 MG CAPS Take 3 mg by mouth at bedtime.    . metFORMIN (GLUCOPHAGE) 500 MG tablet Take 500 mg by mouth 2 (two) times daily with a meal.     . mirtazapine (REMERON) 15 MG tablet Take 15 mg by mouth at bedtime.    . nadolol (CORGARD) 40 MG tablet TAKE 1 TABLET EVERY EVENING (Patient taking differently: Take 40 mg by mouth at bedtime. ) 90 tablet 3  . OVER THE COUNTER MEDICATION Apply 1 application topically daily as needed (for infection). Emuaid Cream     No current facility-administered medications for this visit.     Allergies as of 10/25/2018 - Review Complete 10/25/2018  Allergen Reaction Noted  . Doxycycline Nausea And Vomiting 04/24/2018  . Flomax [tamsulosin] Other (See Comments) 04/27/2017  . Oxybutynin Nausea Only and Other (See Comments) 04/27/2017  . Adhesive [tape] Rash 12/19/2011    Family History  Problem Relation Age of Onset  . Alzheimer's disease Mother   . Liver cancer Father   . Arthritis Sister   . Arthritis Brother   . Colon cancer Neg Hx   . Colon polyps Neg Hx     Social History   Socioeconomic History  . Marital status: Divorced    Spouse name: Not on file  . Number of children: Not on file  . Years of education: Not on file  . Highest education level: Not on file  Occupational History  . 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 Financial resource strain: Not on file  . Food insecurity:    Worry: Not on file     Inability: Not on file  . Transportation needs:    Medical: Not on file    Non-medical: Not on file  Tobacco Use  . Smoking status: Former Smoker    Years: 25.00    Types: Cigarettes    Last attempt to quit: 12/26/1992    Years since quitting: 25.8  . Smokeless tobacco: Former Systems developer    Types: Snuff    Quit date: 04/27/1993  Substance and Sexual Activity  . Alcohol use: No  . Drug use: Never  . Sexual activity: Not on file  Lifestyle  . Physical activity:    Days per week: Not on file    Minutes per session:  Not on file  . Stress: Not on file  Relationships  . Social connections:    Talks on phone: Not on file    Gets together: Not on file    Attends religious service: Not on file    Active member of club or organization: Not on file    Attends meetings of clubs or organizations: Not on file    Relationship status: Not on file  Other Topics Concern  . Not on file  Social History Narrative  . Not on file    Review of Systems: Gen: Denies fever, chills, anorexia. Denies fatigue, weakness, weight loss.  CV: Denies chest pain, palpitations, syncope, peripheral edema, and claudication. Resp: Denies dyspnea at rest, cough, wheezing, coughing up blood, and pleurisy. GI: see HPI  Derm: Denies rash, itching, dry skin Psych: Denies depression, anxiety, memory loss, confusion. No homicidal or suicidal ideation.  Heme: Denies bruising, bleeding, and enlarged lymph nodes.  Physical Exam: BP (!) 154/74   Pulse (!) 56   Temp (!) 97.2 F (36.2 C) (Oral)   Ht 5\' 11"  (1.803 m)   Wt 267 lb 3.2 oz (121.2 kg)   BMI 37.27 kg/m  General:   Alert and oriented. No distress noted. Pleasant and cooperative.  Head:  Normocephalic and atraumatic. Eyes:  Conjuctiva clear without scleral icterus. Mouth:  Oral mucosa pink and moist.  Abdomen:  +BS, soft, obese but non-distended. Protruding umbilical hernia, soft, chronic.  No rebound or guarding. No HSM  Msk:  Symmetrical without gross  deformities. Normal posture. Extremities:  With 1+ edema bilateral lower extremities  Neurologic:  Alert and  oriented x4 Psych:  Alert and cooperative. Normal mood and affect.

## 2018-10-26 DIAGNOSIS — Z299 Encounter for prophylactic measures, unspecified: Secondary | ICD-10-CM | POA: Diagnosis not present

## 2018-10-26 DIAGNOSIS — Z6838 Body mass index (BMI) 38.0-38.9, adult: Secondary | ICD-10-CM | POA: Diagnosis not present

## 2018-10-26 DIAGNOSIS — J189 Pneumonia, unspecified organism: Secondary | ICD-10-CM | POA: Diagnosis not present

## 2018-10-26 DIAGNOSIS — I739 Peripheral vascular disease, unspecified: Secondary | ICD-10-CM | POA: Diagnosis not present

## 2018-10-26 DIAGNOSIS — E1165 Type 2 diabetes mellitus with hyperglycemia: Secondary | ICD-10-CM | POA: Diagnosis not present

## 2018-10-26 DIAGNOSIS — E1142 Type 2 diabetes mellitus with diabetic polyneuropathy: Secondary | ICD-10-CM | POA: Diagnosis not present

## 2018-10-26 DIAGNOSIS — I10 Essential (primary) hypertension: Secondary | ICD-10-CM | POA: Diagnosis not present

## 2018-10-26 NOTE — Progress Notes (Signed)
CC'ED TO PCP 

## 2018-10-30 ENCOUNTER — Ambulatory Visit (HOSPITAL_COMMUNITY)
Admission: RE | Admit: 2018-10-30 | Discharge: 2018-10-30 | Disposition: A | Discharge status: Home / Self Care | Payer: Medicare Other | Source: Ambulatory Visit | Attending: Gastroenterology | Admitting: Gastroenterology

## 2018-10-30 DIAGNOSIS — K7460 Unspecified cirrhosis of liver: Secondary | ICD-10-CM | POA: Insufficient documentation

## 2018-11-01 NOTE — Progress Notes (Signed)
LMOM to call.

## 2018-11-01 NOTE — Progress Notes (Signed)
Cirrhosis as known, no gallstones. Repeat in 6 months.

## 2018-11-21 DIAGNOSIS — I10 Essential (primary) hypertension: Secondary | ICD-10-CM | POA: Diagnosis not present

## 2018-11-21 DIAGNOSIS — E7800 Pure hypercholesterolemia, unspecified: Secondary | ICD-10-CM | POA: Diagnosis not present

## 2018-11-21 DIAGNOSIS — M159 Polyosteoarthritis, unspecified: Secondary | ICD-10-CM | POA: Diagnosis not present

## 2018-11-21 DIAGNOSIS — E119 Type 2 diabetes mellitus without complications: Secondary | ICD-10-CM | POA: Diagnosis not present

## 2018-11-26 DIAGNOSIS — E1142 Type 2 diabetes mellitus with diabetic polyneuropathy: Secondary | ICD-10-CM | POA: Diagnosis not present

## 2018-11-26 DIAGNOSIS — I10 Essential (primary) hypertension: Secondary | ICD-10-CM | POA: Diagnosis not present

## 2018-11-26 DIAGNOSIS — R05 Cough: Secondary | ICD-10-CM | POA: Diagnosis not present

## 2018-11-26 DIAGNOSIS — Z299 Encounter for prophylactic measures, unspecified: Secondary | ICD-10-CM | POA: Diagnosis not present

## 2018-11-26 DIAGNOSIS — E1165 Type 2 diabetes mellitus with hyperglycemia: Secondary | ICD-10-CM | POA: Diagnosis not present

## 2018-11-26 DIAGNOSIS — I739 Peripheral vascular disease, unspecified: Secondary | ICD-10-CM | POA: Diagnosis not present

## 2018-12-24 ENCOUNTER — Telehealth: Payer: Self-pay | Admitting: Gastroenterology

## 2018-12-24 DIAGNOSIS — Z96651 Presence of right artificial knee joint: Secondary | ICD-10-CM | POA: Diagnosis not present

## 2018-12-24 DIAGNOSIS — M2560 Stiffness of unspecified joint, not elsewhere classified: Secondary | ICD-10-CM | POA: Diagnosis not present

## 2018-12-24 DIAGNOSIS — E119 Type 2 diabetes mellitus without complications: Secondary | ICD-10-CM | POA: Diagnosis not present

## 2018-12-24 DIAGNOSIS — R262 Difficulty in walking, not elsewhere classified: Secondary | ICD-10-CM | POA: Diagnosis not present

## 2018-12-24 DIAGNOSIS — M25561 Pain in right knee: Secondary | ICD-10-CM | POA: Diagnosis not present

## 2018-12-24 DIAGNOSIS — M6281 Muscle weakness (generalized): Secondary | ICD-10-CM | POA: Diagnosis not present

## 2018-12-24 DIAGNOSIS — Z471 Aftercare following joint replacement surgery: Secondary | ICD-10-CM | POA: Diagnosis not present

## 2018-12-24 DIAGNOSIS — R2689 Other abnormalities of gait and mobility: Secondary | ICD-10-CM | POA: Diagnosis not present

## 2018-12-24 DIAGNOSIS — M159 Polyosteoarthritis, unspecified: Secondary | ICD-10-CM | POA: Diagnosis not present

## 2018-12-24 DIAGNOSIS — I10 Essential (primary) hypertension: Secondary | ICD-10-CM | POA: Diagnosis not present

## 2018-12-24 DIAGNOSIS — E7800 Pure hypercholesterolemia, unspecified: Secondary | ICD-10-CM | POA: Diagnosis not present

## 2018-12-24 NOTE — Telephone Encounter (Signed)
Received outside labs from Oct 19, 2018.   Hgb 15, Hct 42, Platelets 123, Tbili 0.9, Alk Phos 97, AST 55, ALT 39, Creatinine 1.24, GFR 55, A1c 6.5,

## 2018-12-26 DIAGNOSIS — N3941 Urge incontinence: Secondary | ICD-10-CM | POA: Diagnosis not present

## 2018-12-26 DIAGNOSIS — N401 Enlarged prostate with lower urinary tract symptoms: Secondary | ICD-10-CM | POA: Diagnosis not present

## 2018-12-26 DIAGNOSIS — R351 Nocturia: Secondary | ICD-10-CM | POA: Diagnosis not present

## 2018-12-27 DIAGNOSIS — Z96651 Presence of right artificial knee joint: Secondary | ICD-10-CM | POA: Diagnosis not present

## 2018-12-27 DIAGNOSIS — R262 Difficulty in walking, not elsewhere classified: Secondary | ICD-10-CM | POA: Diagnosis not present

## 2018-12-27 DIAGNOSIS — M25561 Pain in right knee: Secondary | ICD-10-CM | POA: Diagnosis not present

## 2018-12-27 DIAGNOSIS — Z471 Aftercare following joint replacement surgery: Secondary | ICD-10-CM | POA: Diagnosis not present

## 2018-12-27 DIAGNOSIS — R2689 Other abnormalities of gait and mobility: Secondary | ICD-10-CM | POA: Diagnosis not present

## 2018-12-27 DIAGNOSIS — M2560 Stiffness of unspecified joint, not elsewhere classified: Secondary | ICD-10-CM | POA: Diagnosis not present

## 2018-12-27 DIAGNOSIS — M6281 Muscle weakness (generalized): Secondary | ICD-10-CM | POA: Diagnosis not present

## 2018-12-31 DIAGNOSIS — L820 Inflamed seborrheic keratosis: Secondary | ICD-10-CM | POA: Diagnosis not present

## 2018-12-31 DIAGNOSIS — L905 Scar conditions and fibrosis of skin: Secondary | ICD-10-CM | POA: Diagnosis not present

## 2018-12-31 DIAGNOSIS — L853 Xerosis cutis: Secondary | ICD-10-CM | POA: Diagnosis not present

## 2018-12-31 DIAGNOSIS — L821 Other seborrheic keratosis: Secondary | ICD-10-CM | POA: Diagnosis not present

## 2018-12-31 DIAGNOSIS — B009 Herpesviral infection, unspecified: Secondary | ICD-10-CM | POA: Diagnosis not present

## 2018-12-31 DIAGNOSIS — Z85828 Personal history of other malignant neoplasm of skin: Secondary | ICD-10-CM | POA: Diagnosis not present

## 2018-12-31 DIAGNOSIS — L570 Actinic keratosis: Secondary | ICD-10-CM | POA: Diagnosis not present

## 2018-12-31 DIAGNOSIS — C44622 Squamous cell carcinoma of skin of right upper limb, including shoulder: Secondary | ICD-10-CM | POA: Diagnosis not present

## 2018-12-31 DIAGNOSIS — D485 Neoplasm of uncertain behavior of skin: Secondary | ICD-10-CM | POA: Diagnosis not present

## 2019-01-01 DIAGNOSIS — M6281 Muscle weakness (generalized): Secondary | ICD-10-CM | POA: Diagnosis not present

## 2019-01-01 DIAGNOSIS — M2560 Stiffness of unspecified joint, not elsewhere classified: Secondary | ICD-10-CM | POA: Diagnosis not present

## 2019-01-01 DIAGNOSIS — M25561 Pain in right knee: Secondary | ICD-10-CM | POA: Diagnosis not present

## 2019-01-01 DIAGNOSIS — R262 Difficulty in walking, not elsewhere classified: Secondary | ICD-10-CM | POA: Diagnosis not present

## 2019-01-01 DIAGNOSIS — Z471 Aftercare following joint replacement surgery: Secondary | ICD-10-CM | POA: Diagnosis not present

## 2019-01-01 DIAGNOSIS — Z96651 Presence of right artificial knee joint: Secondary | ICD-10-CM | POA: Diagnosis not present

## 2019-01-01 DIAGNOSIS — R2689 Other abnormalities of gait and mobility: Secondary | ICD-10-CM | POA: Diagnosis not present

## 2019-01-02 DIAGNOSIS — C44622 Squamous cell carcinoma of skin of right upper limb, including shoulder: Secondary | ICD-10-CM | POA: Diagnosis not present

## 2019-01-02 DIAGNOSIS — L905 Scar conditions and fibrosis of skin: Secondary | ICD-10-CM | POA: Diagnosis not present

## 2019-01-03 DIAGNOSIS — R2689 Other abnormalities of gait and mobility: Secondary | ICD-10-CM | POA: Diagnosis not present

## 2019-01-03 DIAGNOSIS — R262 Difficulty in walking, not elsewhere classified: Secondary | ICD-10-CM | POA: Diagnosis not present

## 2019-01-03 DIAGNOSIS — M2560 Stiffness of unspecified joint, not elsewhere classified: Secondary | ICD-10-CM | POA: Diagnosis not present

## 2019-01-03 DIAGNOSIS — M6281 Muscle weakness (generalized): Secondary | ICD-10-CM | POA: Diagnosis not present

## 2019-01-03 DIAGNOSIS — Z471 Aftercare following joint replacement surgery: Secondary | ICD-10-CM | POA: Diagnosis not present

## 2019-01-03 DIAGNOSIS — Z96651 Presence of right artificial knee joint: Secondary | ICD-10-CM | POA: Diagnosis not present

## 2019-01-03 DIAGNOSIS — M25561 Pain in right knee: Secondary | ICD-10-CM | POA: Diagnosis not present

## 2019-01-07 DIAGNOSIS — M2560 Stiffness of unspecified joint, not elsewhere classified: Secondary | ICD-10-CM | POA: Diagnosis not present

## 2019-01-07 DIAGNOSIS — R262 Difficulty in walking, not elsewhere classified: Secondary | ICD-10-CM | POA: Diagnosis not present

## 2019-01-07 DIAGNOSIS — R2689 Other abnormalities of gait and mobility: Secondary | ICD-10-CM | POA: Diagnosis not present

## 2019-01-07 DIAGNOSIS — Z96651 Presence of right artificial knee joint: Secondary | ICD-10-CM | POA: Diagnosis not present

## 2019-01-07 DIAGNOSIS — Z471 Aftercare following joint replacement surgery: Secondary | ICD-10-CM | POA: Diagnosis not present

## 2019-01-07 DIAGNOSIS — M25561 Pain in right knee: Secondary | ICD-10-CM | POA: Diagnosis not present

## 2019-01-07 DIAGNOSIS — M6281 Muscle weakness (generalized): Secondary | ICD-10-CM | POA: Diagnosis not present

## 2019-01-08 DIAGNOSIS — N3941 Urge incontinence: Secondary | ICD-10-CM | POA: Diagnosis not present

## 2019-01-10 DIAGNOSIS — M6281 Muscle weakness (generalized): Secondary | ICD-10-CM | POA: Diagnosis not present

## 2019-01-10 DIAGNOSIS — Z471 Aftercare following joint replacement surgery: Secondary | ICD-10-CM | POA: Diagnosis not present

## 2019-01-10 DIAGNOSIS — R262 Difficulty in walking, not elsewhere classified: Secondary | ICD-10-CM | POA: Diagnosis not present

## 2019-01-10 DIAGNOSIS — Z96651 Presence of right artificial knee joint: Secondary | ICD-10-CM | POA: Diagnosis not present

## 2019-01-10 DIAGNOSIS — M2560 Stiffness of unspecified joint, not elsewhere classified: Secondary | ICD-10-CM | POA: Diagnosis not present

## 2019-01-10 DIAGNOSIS — M25561 Pain in right knee: Secondary | ICD-10-CM | POA: Diagnosis not present

## 2019-01-10 DIAGNOSIS — R2689 Other abnormalities of gait and mobility: Secondary | ICD-10-CM | POA: Diagnosis not present

## 2019-01-14 DIAGNOSIS — M6281 Muscle weakness (generalized): Secondary | ICD-10-CM | POA: Diagnosis not present

## 2019-01-14 DIAGNOSIS — R262 Difficulty in walking, not elsewhere classified: Secondary | ICD-10-CM | POA: Diagnosis not present

## 2019-01-14 DIAGNOSIS — Z471 Aftercare following joint replacement surgery: Secondary | ICD-10-CM | POA: Diagnosis not present

## 2019-01-14 DIAGNOSIS — M25561 Pain in right knee: Secondary | ICD-10-CM | POA: Diagnosis not present

## 2019-01-14 DIAGNOSIS — M2560 Stiffness of unspecified joint, not elsewhere classified: Secondary | ICD-10-CM | POA: Diagnosis not present

## 2019-01-14 DIAGNOSIS — R2689 Other abnormalities of gait and mobility: Secondary | ICD-10-CM | POA: Diagnosis not present

## 2019-01-14 DIAGNOSIS — Z96651 Presence of right artificial knee joint: Secondary | ICD-10-CM | POA: Diagnosis not present

## 2019-01-15 DIAGNOSIS — N3941 Urge incontinence: Secondary | ICD-10-CM | POA: Diagnosis not present

## 2019-01-15 DIAGNOSIS — R3915 Urgency of urination: Secondary | ICD-10-CM | POA: Diagnosis not present

## 2019-01-17 DIAGNOSIS — Z96651 Presence of right artificial knee joint: Secondary | ICD-10-CM | POA: Diagnosis not present

## 2019-01-17 DIAGNOSIS — M2560 Stiffness of unspecified joint, not elsewhere classified: Secondary | ICD-10-CM | POA: Diagnosis not present

## 2019-01-17 DIAGNOSIS — M25561 Pain in right knee: Secondary | ICD-10-CM | POA: Diagnosis not present

## 2019-01-17 DIAGNOSIS — R2689 Other abnormalities of gait and mobility: Secondary | ICD-10-CM | POA: Diagnosis not present

## 2019-01-17 DIAGNOSIS — Z471 Aftercare following joint replacement surgery: Secondary | ICD-10-CM | POA: Diagnosis not present

## 2019-01-17 DIAGNOSIS — M6281 Muscle weakness (generalized): Secondary | ICD-10-CM | POA: Diagnosis not present

## 2019-01-17 DIAGNOSIS — R262 Difficulty in walking, not elsewhere classified: Secondary | ICD-10-CM | POA: Diagnosis not present

## 2019-01-21 DIAGNOSIS — Z471 Aftercare following joint replacement surgery: Secondary | ICD-10-CM | POA: Diagnosis not present

## 2019-01-21 DIAGNOSIS — R2689 Other abnormalities of gait and mobility: Secondary | ICD-10-CM | POA: Diagnosis not present

## 2019-01-21 DIAGNOSIS — Z96651 Presence of right artificial knee joint: Secondary | ICD-10-CM | POA: Diagnosis not present

## 2019-01-21 DIAGNOSIS — M2560 Stiffness of unspecified joint, not elsewhere classified: Secondary | ICD-10-CM | POA: Diagnosis not present

## 2019-01-21 DIAGNOSIS — M6281 Muscle weakness (generalized): Secondary | ICD-10-CM | POA: Diagnosis not present

## 2019-01-21 DIAGNOSIS — M25561 Pain in right knee: Secondary | ICD-10-CM | POA: Diagnosis not present

## 2019-01-21 DIAGNOSIS — R262 Difficulty in walking, not elsewhere classified: Secondary | ICD-10-CM | POA: Diagnosis not present

## 2019-01-22 DIAGNOSIS — R3915 Urgency of urination: Secondary | ICD-10-CM | POA: Diagnosis not present

## 2019-01-22 DIAGNOSIS — N3941 Urge incontinence: Secondary | ICD-10-CM | POA: Diagnosis not present

## 2019-01-24 DIAGNOSIS — H04123 Dry eye syndrome of bilateral lacrimal glands: Secondary | ICD-10-CM | POA: Diagnosis not present

## 2019-01-24 DIAGNOSIS — R2689 Other abnormalities of gait and mobility: Secondary | ICD-10-CM | POA: Diagnosis not present

## 2019-01-24 DIAGNOSIS — M6281 Muscle weakness (generalized): Secondary | ICD-10-CM | POA: Diagnosis not present

## 2019-01-24 DIAGNOSIS — M2560 Stiffness of unspecified joint, not elsewhere classified: Secondary | ICD-10-CM | POA: Diagnosis not present

## 2019-01-24 DIAGNOSIS — M25561 Pain in right knee: Secondary | ICD-10-CM | POA: Diagnosis not present

## 2019-01-24 DIAGNOSIS — H353131 Nonexudative age-related macular degeneration, bilateral, early dry stage: Secondary | ICD-10-CM | POA: Diagnosis not present

## 2019-01-24 DIAGNOSIS — R262 Difficulty in walking, not elsewhere classified: Secondary | ICD-10-CM | POA: Diagnosis not present

## 2019-01-24 DIAGNOSIS — H40013 Open angle with borderline findings, low risk, bilateral: Secondary | ICD-10-CM | POA: Diagnosis not present

## 2019-01-24 DIAGNOSIS — E119 Type 2 diabetes mellitus without complications: Secondary | ICD-10-CM | POA: Diagnosis not present

## 2019-01-24 DIAGNOSIS — Z471 Aftercare following joint replacement surgery: Secondary | ICD-10-CM | POA: Diagnosis not present

## 2019-01-24 DIAGNOSIS — H524 Presbyopia: Secondary | ICD-10-CM | POA: Diagnosis not present

## 2019-01-24 DIAGNOSIS — Z96651 Presence of right artificial knee joint: Secondary | ICD-10-CM | POA: Diagnosis not present

## 2019-01-29 DIAGNOSIS — N3941 Urge incontinence: Secondary | ICD-10-CM | POA: Diagnosis not present

## 2019-01-29 DIAGNOSIS — R3915 Urgency of urination: Secondary | ICD-10-CM | POA: Diagnosis not present

## 2019-01-31 DIAGNOSIS — R2689 Other abnormalities of gait and mobility: Secondary | ICD-10-CM | POA: Diagnosis not present

## 2019-01-31 DIAGNOSIS — R262 Difficulty in walking, not elsewhere classified: Secondary | ICD-10-CM | POA: Diagnosis not present

## 2019-01-31 DIAGNOSIS — M2560 Stiffness of unspecified joint, not elsewhere classified: Secondary | ICD-10-CM | POA: Diagnosis not present

## 2019-01-31 DIAGNOSIS — Z471 Aftercare following joint replacement surgery: Secondary | ICD-10-CM | POA: Diagnosis not present

## 2019-01-31 DIAGNOSIS — M6281 Muscle weakness (generalized): Secondary | ICD-10-CM | POA: Diagnosis not present

## 2019-01-31 DIAGNOSIS — M25561 Pain in right knee: Secondary | ICD-10-CM | POA: Diagnosis not present

## 2019-01-31 DIAGNOSIS — Z96651 Presence of right artificial knee joint: Secondary | ICD-10-CM | POA: Diagnosis not present

## 2019-02-05 DIAGNOSIS — R3915 Urgency of urination: Secondary | ICD-10-CM | POA: Diagnosis not present

## 2019-02-05 DIAGNOSIS — Z96651 Presence of right artificial knee joint: Secondary | ICD-10-CM | POA: Diagnosis not present

## 2019-02-05 DIAGNOSIS — M6281 Muscle weakness (generalized): Secondary | ICD-10-CM | POA: Diagnosis not present

## 2019-02-05 DIAGNOSIS — R262 Difficulty in walking, not elsewhere classified: Secondary | ICD-10-CM | POA: Diagnosis not present

## 2019-02-05 DIAGNOSIS — N3941 Urge incontinence: Secondary | ICD-10-CM | POA: Diagnosis not present

## 2019-02-05 DIAGNOSIS — M25561 Pain in right knee: Secondary | ICD-10-CM | POA: Diagnosis not present

## 2019-02-05 DIAGNOSIS — R2689 Other abnormalities of gait and mobility: Secondary | ICD-10-CM | POA: Diagnosis not present

## 2019-02-05 DIAGNOSIS — Z471 Aftercare following joint replacement surgery: Secondary | ICD-10-CM | POA: Diagnosis not present

## 2019-02-05 DIAGNOSIS — M2560 Stiffness of unspecified joint, not elsewhere classified: Secondary | ICD-10-CM | POA: Diagnosis not present

## 2019-02-07 DIAGNOSIS — Z96651 Presence of right artificial knee joint: Secondary | ICD-10-CM | POA: Diagnosis not present

## 2019-02-07 DIAGNOSIS — M2560 Stiffness of unspecified joint, not elsewhere classified: Secondary | ICD-10-CM | POA: Diagnosis not present

## 2019-02-07 DIAGNOSIS — M6281 Muscle weakness (generalized): Secondary | ICD-10-CM | POA: Diagnosis not present

## 2019-02-07 DIAGNOSIS — Z471 Aftercare following joint replacement surgery: Secondary | ICD-10-CM | POA: Diagnosis not present

## 2019-02-07 DIAGNOSIS — R2689 Other abnormalities of gait and mobility: Secondary | ICD-10-CM | POA: Diagnosis not present

## 2019-02-07 DIAGNOSIS — M25561 Pain in right knee: Secondary | ICD-10-CM | POA: Diagnosis not present

## 2019-02-07 DIAGNOSIS — R262 Difficulty in walking, not elsewhere classified: Secondary | ICD-10-CM | POA: Diagnosis not present

## 2019-02-12 DIAGNOSIS — N3941 Urge incontinence: Secondary | ICD-10-CM | POA: Diagnosis not present

## 2019-02-12 DIAGNOSIS — R3915 Urgency of urination: Secondary | ICD-10-CM | POA: Diagnosis not present

## 2019-02-12 DIAGNOSIS — Z96651 Presence of right artificial knee joint: Secondary | ICD-10-CM | POA: Diagnosis not present

## 2019-02-12 DIAGNOSIS — M2560 Stiffness of unspecified joint, not elsewhere classified: Secondary | ICD-10-CM | POA: Diagnosis not present

## 2019-02-12 DIAGNOSIS — M25561 Pain in right knee: Secondary | ICD-10-CM | POA: Diagnosis not present

## 2019-02-12 DIAGNOSIS — R262 Difficulty in walking, not elsewhere classified: Secondary | ICD-10-CM | POA: Diagnosis not present

## 2019-02-12 DIAGNOSIS — M6281 Muscle weakness (generalized): Secondary | ICD-10-CM | POA: Diagnosis not present

## 2019-02-12 DIAGNOSIS — R2689 Other abnormalities of gait and mobility: Secondary | ICD-10-CM | POA: Diagnosis not present

## 2019-02-12 DIAGNOSIS — Z471 Aftercare following joint replacement surgery: Secondary | ICD-10-CM | POA: Diagnosis not present

## 2019-02-13 DIAGNOSIS — E7800 Pure hypercholesterolemia, unspecified: Secondary | ICD-10-CM | POA: Diagnosis not present

## 2019-02-13 DIAGNOSIS — E119 Type 2 diabetes mellitus without complications: Secondary | ICD-10-CM | POA: Diagnosis not present

## 2019-02-13 DIAGNOSIS — M159 Polyosteoarthritis, unspecified: Secondary | ICD-10-CM | POA: Diagnosis not present

## 2019-02-13 DIAGNOSIS — I10 Essential (primary) hypertension: Secondary | ICD-10-CM | POA: Diagnosis not present

## 2019-02-14 DIAGNOSIS — M2560 Stiffness of unspecified joint, not elsewhere classified: Secondary | ICD-10-CM | POA: Diagnosis not present

## 2019-02-14 DIAGNOSIS — M6281 Muscle weakness (generalized): Secondary | ICD-10-CM | POA: Diagnosis not present

## 2019-02-14 DIAGNOSIS — R2689 Other abnormalities of gait and mobility: Secondary | ICD-10-CM | POA: Diagnosis not present

## 2019-02-14 DIAGNOSIS — Z471 Aftercare following joint replacement surgery: Secondary | ICD-10-CM | POA: Diagnosis not present

## 2019-02-14 DIAGNOSIS — Z96651 Presence of right artificial knee joint: Secondary | ICD-10-CM | POA: Diagnosis not present

## 2019-02-14 DIAGNOSIS — R262 Difficulty in walking, not elsewhere classified: Secondary | ICD-10-CM | POA: Diagnosis not present

## 2019-02-14 DIAGNOSIS — M25561 Pain in right knee: Secondary | ICD-10-CM | POA: Diagnosis not present

## 2019-02-19 DIAGNOSIS — M25561 Pain in right knee: Secondary | ICD-10-CM | POA: Diagnosis not present

## 2019-02-19 DIAGNOSIS — R262 Difficulty in walking, not elsewhere classified: Secondary | ICD-10-CM | POA: Diagnosis not present

## 2019-02-19 DIAGNOSIS — Z96651 Presence of right artificial knee joint: Secondary | ICD-10-CM | POA: Diagnosis not present

## 2019-02-19 DIAGNOSIS — R2689 Other abnormalities of gait and mobility: Secondary | ICD-10-CM | POA: Diagnosis not present

## 2019-02-19 DIAGNOSIS — M6281 Muscle weakness (generalized): Secondary | ICD-10-CM | POA: Diagnosis not present

## 2019-02-19 DIAGNOSIS — Z471 Aftercare following joint replacement surgery: Secondary | ICD-10-CM | POA: Diagnosis not present

## 2019-02-19 DIAGNOSIS — R3915 Urgency of urination: Secondary | ICD-10-CM | POA: Diagnosis not present

## 2019-02-19 DIAGNOSIS — N3941 Urge incontinence: Secondary | ICD-10-CM | POA: Diagnosis not present

## 2019-02-19 DIAGNOSIS — M2560 Stiffness of unspecified joint, not elsewhere classified: Secondary | ICD-10-CM | POA: Diagnosis not present

## 2019-02-21 DIAGNOSIS — M6281 Muscle weakness (generalized): Secondary | ICD-10-CM | POA: Diagnosis not present

## 2019-02-21 DIAGNOSIS — Z96651 Presence of right artificial knee joint: Secondary | ICD-10-CM | POA: Diagnosis not present

## 2019-02-21 DIAGNOSIS — Z471 Aftercare following joint replacement surgery: Secondary | ICD-10-CM | POA: Diagnosis not present

## 2019-02-21 DIAGNOSIS — R262 Difficulty in walking, not elsewhere classified: Secondary | ICD-10-CM | POA: Diagnosis not present

## 2019-02-21 DIAGNOSIS — R2689 Other abnormalities of gait and mobility: Secondary | ICD-10-CM | POA: Diagnosis not present

## 2019-02-21 DIAGNOSIS — M2560 Stiffness of unspecified joint, not elsewhere classified: Secondary | ICD-10-CM | POA: Diagnosis not present

## 2019-02-21 DIAGNOSIS — M25561 Pain in right knee: Secondary | ICD-10-CM | POA: Diagnosis not present

## 2019-02-26 DIAGNOSIS — N3941 Urge incontinence: Secondary | ICD-10-CM | POA: Diagnosis not present

## 2019-02-27 DIAGNOSIS — J9601 Acute respiratory failure with hypoxia: Secondary | ICD-10-CM | POA: Diagnosis not present

## 2019-02-27 DIAGNOSIS — Z299 Encounter for prophylactic measures, unspecified: Secondary | ICD-10-CM | POA: Diagnosis not present

## 2019-02-27 DIAGNOSIS — Z7984 Long term (current) use of oral hypoglycemic drugs: Secondary | ICD-10-CM | POA: Diagnosis not present

## 2019-02-27 DIAGNOSIS — Z79899 Other long term (current) drug therapy: Secondary | ICD-10-CM | POA: Diagnosis not present

## 2019-02-27 DIAGNOSIS — N179 Acute kidney failure, unspecified: Secondary | ICD-10-CM | POA: Diagnosis not present

## 2019-02-27 DIAGNOSIS — E119 Type 2 diabetes mellitus without complications: Secondary | ICD-10-CM | POA: Diagnosis present

## 2019-02-27 DIAGNOSIS — Z03818 Encounter for observation for suspected exposure to other biological agents ruled out: Secondary | ICD-10-CM | POA: Diagnosis not present

## 2019-02-27 DIAGNOSIS — N400 Enlarged prostate without lower urinary tract symptoms: Secondary | ICD-10-CM | POA: Diagnosis present

## 2019-02-27 DIAGNOSIS — R7881 Bacteremia: Secondary | ICD-10-CM | POA: Diagnosis present

## 2019-02-27 DIAGNOSIS — J1520 Pneumonia due to staphylococcus, unspecified: Secondary | ICD-10-CM | POA: Diagnosis not present

## 2019-02-27 DIAGNOSIS — E785 Hyperlipidemia, unspecified: Secondary | ICD-10-CM | POA: Diagnosis present

## 2019-02-27 DIAGNOSIS — R600 Localized edema: Secondary | ICD-10-CM | POA: Diagnosis not present

## 2019-02-27 DIAGNOSIS — K7460 Unspecified cirrhosis of liver: Secondary | ICD-10-CM | POA: Diagnosis not present

## 2019-02-27 DIAGNOSIS — B957 Other staphylococcus as the cause of diseases classified elsewhere: Secondary | ICD-10-CM | POA: Diagnosis present

## 2019-02-27 DIAGNOSIS — J189 Pneumonia, unspecified organism: Secondary | ICD-10-CM

## 2019-02-27 DIAGNOSIS — R0600 Dyspnea, unspecified: Secondary | ICD-10-CM | POA: Diagnosis not present

## 2019-02-27 DIAGNOSIS — L03115 Cellulitis of right lower limb: Secondary | ICD-10-CM | POA: Diagnosis present

## 2019-02-27 DIAGNOSIS — K7581 Nonalcoholic steatohepatitis (NASH): Secondary | ICD-10-CM | POA: Diagnosis present

## 2019-02-27 DIAGNOSIS — F329 Major depressive disorder, single episode, unspecified: Secondary | ICD-10-CM | POA: Diagnosis present

## 2019-02-27 DIAGNOSIS — R0602 Shortness of breath: Secondary | ICD-10-CM | POA: Diagnosis not present

## 2019-02-27 DIAGNOSIS — R509 Fever, unspecified: Secondary | ICD-10-CM | POA: Diagnosis not present

## 2019-02-27 DIAGNOSIS — Z20828 Contact with and (suspected) exposure to other viral communicable diseases: Secondary | ICD-10-CM | POA: Diagnosis not present

## 2019-02-27 DIAGNOSIS — I10 Essential (primary) hypertension: Secondary | ICD-10-CM | POA: Diagnosis present

## 2019-02-27 DIAGNOSIS — R0989 Other specified symptoms and signs involving the circulatory and respiratory systems: Secondary | ICD-10-CM | POA: Diagnosis not present

## 2019-02-27 DIAGNOSIS — Z6838 Body mass index (BMI) 38.0-38.9, adult: Secondary | ICD-10-CM | POA: Diagnosis not present

## 2019-02-27 DIAGNOSIS — Z1159 Encounter for screening for other viral diseases: Secondary | ICD-10-CM | POA: Diagnosis not present

## 2019-02-27 HISTORY — DX: Pneumonia, unspecified organism: J18.9

## 2019-03-12 DIAGNOSIS — E1165 Type 2 diabetes mellitus with hyperglycemia: Secondary | ICD-10-CM | POA: Diagnosis not present

## 2019-03-12 DIAGNOSIS — Z299 Encounter for prophylactic measures, unspecified: Secondary | ICD-10-CM | POA: Diagnosis not present

## 2019-03-12 DIAGNOSIS — R600 Localized edema: Secondary | ICD-10-CM | POA: Diagnosis not present

## 2019-03-12 DIAGNOSIS — I10 Essential (primary) hypertension: Secondary | ICD-10-CM | POA: Diagnosis not present

## 2019-03-12 DIAGNOSIS — Z6838 Body mass index (BMI) 38.0-38.9, adult: Secondary | ICD-10-CM | POA: Diagnosis not present

## 2019-03-12 DIAGNOSIS — J189 Pneumonia, unspecified organism: Secondary | ICD-10-CM | POA: Diagnosis not present

## 2019-03-13 ENCOUNTER — Encounter: Payer: Self-pay | Admitting: Gastroenterology

## 2019-03-19 DIAGNOSIS — N3941 Urge incontinence: Secondary | ICD-10-CM | POA: Diagnosis not present

## 2019-03-20 DIAGNOSIS — L821 Other seborrheic keratosis: Secondary | ICD-10-CM | POA: Diagnosis not present

## 2019-03-20 DIAGNOSIS — L905 Scar conditions and fibrosis of skin: Secondary | ICD-10-CM | POA: Diagnosis not present

## 2019-03-20 DIAGNOSIS — L02212 Cutaneous abscess of back [any part, except buttock]: Secondary | ICD-10-CM | POA: Diagnosis not present

## 2019-03-20 DIAGNOSIS — Z85828 Personal history of other malignant neoplasm of skin: Secondary | ICD-10-CM | POA: Diagnosis not present

## 2019-03-26 DIAGNOSIS — N3941 Urge incontinence: Secondary | ICD-10-CM | POA: Diagnosis not present

## 2019-03-28 DIAGNOSIS — M25461 Effusion, right knee: Secondary | ICD-10-CM | POA: Diagnosis not present

## 2019-03-28 DIAGNOSIS — Z96651 Presence of right artificial knee joint: Secondary | ICD-10-CM | POA: Diagnosis not present

## 2019-03-28 DIAGNOSIS — M25561 Pain in right knee: Secondary | ICD-10-CM | POA: Diagnosis not present

## 2019-04-04 DIAGNOSIS — M25561 Pain in right knee: Secondary | ICD-10-CM | POA: Diagnosis not present

## 2019-04-04 DIAGNOSIS — N3941 Urge incontinence: Secondary | ICD-10-CM | POA: Diagnosis not present

## 2019-04-09 ENCOUNTER — Telehealth: Payer: Self-pay | Admitting: Gastroenterology

## 2019-04-09 DIAGNOSIS — R3915 Urgency of urination: Secondary | ICD-10-CM | POA: Diagnosis not present

## 2019-04-09 DIAGNOSIS — N3941 Urge incontinence: Secondary | ICD-10-CM | POA: Diagnosis not present

## 2019-04-09 NOTE — Telephone Encounter (Signed)
RECALL FOR ULTRASOUND 

## 2019-04-09 NOTE — Telephone Encounter (Signed)
Letter sent.

## 2019-04-16 ENCOUNTER — Telehealth: Payer: Self-pay | Admitting: Gastroenterology

## 2019-04-16 DIAGNOSIS — K7460 Unspecified cirrhosis of liver: Secondary | ICD-10-CM

## 2019-04-16 NOTE — Telephone Encounter (Signed)
U/s scheduled for 7/31 at 9:30am, arrival time 9:15am, npo after midnight  Patient aware of appt detail. Letter mailed.

## 2019-04-16 NOTE — Telephone Encounter (Signed)
Pt received letter from [place] that it was time to schedule his U/S. He has OV with [place] on 7/31 at 1030am and was requesting to have it done the same day so he wouldn't have to drive as much. Please call [phone]

## 2019-04-18 DIAGNOSIS — Z7189 Other specified counseling: Secondary | ICD-10-CM | POA: Diagnosis not present

## 2019-04-18 DIAGNOSIS — Z6838 Body mass index (BMI) 38.0-38.9, adult: Secondary | ICD-10-CM | POA: Diagnosis not present

## 2019-04-18 DIAGNOSIS — Z79899 Other long term (current) drug therapy: Secondary | ICD-10-CM | POA: Diagnosis not present

## 2019-04-18 DIAGNOSIS — Z0000 Encounter for general adult medical examination without abnormal findings: Secondary | ICD-10-CM | POA: Diagnosis not present

## 2019-04-18 DIAGNOSIS — Z125 Encounter for screening for malignant neoplasm of prostate: Secondary | ICD-10-CM | POA: Diagnosis not present

## 2019-04-18 DIAGNOSIS — I10 Essential (primary) hypertension: Secondary | ICD-10-CM | POA: Diagnosis not present

## 2019-04-18 DIAGNOSIS — Z1211 Encounter for screening for malignant neoplasm of colon: Secondary | ICD-10-CM | POA: Diagnosis not present

## 2019-04-18 DIAGNOSIS — Z299 Encounter for prophylactic measures, unspecified: Secondary | ICD-10-CM | POA: Diagnosis not present

## 2019-04-18 DIAGNOSIS — Z1331 Encounter for screening for depression: Secondary | ICD-10-CM | POA: Diagnosis not present

## 2019-04-18 DIAGNOSIS — Z1339 Encounter for screening examination for other mental health and behavioral disorders: Secondary | ICD-10-CM | POA: Diagnosis not present

## 2019-04-18 DIAGNOSIS — E7800 Pure hypercholesterolemia, unspecified: Secondary | ICD-10-CM | POA: Diagnosis not present

## 2019-04-18 DIAGNOSIS — R5383 Other fatigue: Secondary | ICD-10-CM | POA: Diagnosis not present

## 2019-04-25 ENCOUNTER — Ambulatory Visit: Payer: Medicare Other | Admitting: Gastroenterology

## 2019-04-25 DIAGNOSIS — H6120 Impacted cerumen, unspecified ear: Secondary | ICD-10-CM | POA: Diagnosis not present

## 2019-04-25 DIAGNOSIS — Z299 Encounter for prophylactic measures, unspecified: Secondary | ICD-10-CM | POA: Diagnosis not present

## 2019-04-25 DIAGNOSIS — S99929A Unspecified injury of unspecified foot, initial encounter: Secondary | ICD-10-CM | POA: Diagnosis not present

## 2019-04-25 DIAGNOSIS — I10 Essential (primary) hypertension: Secondary | ICD-10-CM | POA: Diagnosis not present

## 2019-04-25 DIAGNOSIS — Z6839 Body mass index (BMI) 39.0-39.9, adult: Secondary | ICD-10-CM | POA: Diagnosis not present

## 2019-04-25 DIAGNOSIS — R600 Localized edema: Secondary | ICD-10-CM | POA: Diagnosis not present

## 2019-04-30 DIAGNOSIS — N3941 Urge incontinence: Secondary | ICD-10-CM | POA: Diagnosis not present

## 2019-04-30 DIAGNOSIS — R3915 Urgency of urination: Secondary | ICD-10-CM | POA: Diagnosis not present

## 2019-05-03 DIAGNOSIS — E119 Type 2 diabetes mellitus without complications: Secondary | ICD-10-CM | POA: Diagnosis not present

## 2019-05-03 DIAGNOSIS — E7800 Pure hypercholesterolemia, unspecified: Secondary | ICD-10-CM | POA: Diagnosis not present

## 2019-05-03 DIAGNOSIS — M159 Polyosteoarthritis, unspecified: Secondary | ICD-10-CM | POA: Diagnosis not present

## 2019-05-03 DIAGNOSIS — I10 Essential (primary) hypertension: Secondary | ICD-10-CM | POA: Diagnosis not present

## 2019-05-09 DIAGNOSIS — E1165 Type 2 diabetes mellitus with hyperglycemia: Secondary | ICD-10-CM | POA: Diagnosis not present

## 2019-05-09 DIAGNOSIS — Z6838 Body mass index (BMI) 38.0-38.9, adult: Secondary | ICD-10-CM | POA: Diagnosis not present

## 2019-05-09 DIAGNOSIS — I739 Peripheral vascular disease, unspecified: Secondary | ICD-10-CM | POA: Diagnosis not present

## 2019-05-09 DIAGNOSIS — R600 Localized edema: Secondary | ICD-10-CM | POA: Diagnosis not present

## 2019-05-09 DIAGNOSIS — I10 Essential (primary) hypertension: Secondary | ICD-10-CM | POA: Diagnosis not present

## 2019-05-09 DIAGNOSIS — Z299 Encounter for prophylactic measures, unspecified: Secondary | ICD-10-CM | POA: Diagnosis not present

## 2019-05-17 ENCOUNTER — Ambulatory Visit (HOSPITAL_COMMUNITY)
Admission: RE | Admit: 2019-05-17 | Discharge: 2019-05-17 | Disposition: A | Discharge status: Home / Self Care | Payer: Medicare Other | Source: Ambulatory Visit | Attending: Gastroenterology | Admitting: Gastroenterology

## 2019-05-17 ENCOUNTER — Encounter: Payer: Self-pay | Admitting: Gastroenterology

## 2019-05-17 ENCOUNTER — Ambulatory Visit (INDEPENDENT_AMBULATORY_CARE_PROVIDER_SITE_OTHER): Payer: Medicare Other | Admitting: Gastroenterology

## 2019-05-17 ENCOUNTER — Telehealth: Payer: Self-pay

## 2019-05-17 ENCOUNTER — Other Ambulatory Visit: Payer: Self-pay

## 2019-05-17 VITALS — BP 150/75 | HR 61 | Temp 97.8°F | Ht 71.0 in | Wt 271.8 lb

## 2019-05-17 DIAGNOSIS — K828 Other specified diseases of gallbladder: Secondary | ICD-10-CM | POA: Diagnosis not present

## 2019-05-17 DIAGNOSIS — R195 Other fecal abnormalities: Secondary | ICD-10-CM

## 2019-05-17 DIAGNOSIS — K7460 Unspecified cirrhosis of liver: Secondary | ICD-10-CM

## 2019-05-17 MED ORDER — PEG 3350-KCL-NA BICARB-NACL 420 G PO SOLR: 4000.0000 mL | ORAL | 0 refills | Status: DC

## 2019-05-17 NOTE — Telephone Encounter (Signed)
TCS w/Propofol w/SLF scheduled during OV for 08/13/19 at 8:30am. AB advised for pt to hold Metformin the morning of procedure. Noted on instructions.

## 2019-05-17 NOTE — Patient Instructions (Signed)
I will get the blood work from Dr. Manuella Ghazi.  We are arranging a colonoscopy with Dr. Oneida Alar in the near future.   We will arrange a follow-up visit with Dr. Oneida Alar in 6 months!  I enjoyed seeing you again today! As you know, I value our relationship and want to provide genuine, compassionate, and quality care. I welcome your feedback. If you receive a survey regarding your visit,  I greatly appreciate you taking time to fill this out. See you next time!  Annitta Needs, PhD, ANP-BC [facility]

## 2019-05-17 NOTE — Progress Notes (Addendum)
REVIEWED-NO ADDITIONAL RECOMMENDATIONS.  Referring Provider: Monico Blitz, MD Primary Care Physician:  Monico Blitz, MD Primary GI: Dr. Oneida Alar   Chief Complaint  Patient presents with  . Cirrhosis  . blood in stool    + at PCP office    HPI:   Jared Martinez is a 78 y.o. male presenting today with a history of likely NASH cirrhosis. Previously followed by Dr. Britta Mccreedy. Established care with [facility] in July 2018.Labs with negative Hep B surface antibody and core antibody, Hep C antibody negative. ANA negative. Ferritin 84. Hep A total antibody negative. Alpha-1 antitrypsin normal at 112. Hep B surface antigen negative.Completed Hep A and B vaccination in the past.Last colonoscopy in 2016 by Dr. Britta Mccreedy with multiple sessile polyps (tubular adenomas), diverticulosis in left colon, small angiodysplastic lesion in transverse and ascending colon. Last EGD in Sept 2017 with 3 columns of medium-sized varices without stigmata of bleeding. Otherwise normal. On Nadolol for bleeding prophylaxis.  US abdomen completed this month. Surveillance in 6 months.  Reports recently heme positive through PCP but no overt GI bleeding. Left lower abdomen discomfort with movement and worsened when first sitting down. Unable to describe. Intermittent. Nothing relieves. Pain will feel about 7/10. Will occasionally have fecal urgency. Undergoing neurostimulation for urinary urgency. No confusion or mental status changes. Eats too much.   Past Medical History:  Diagnosis Date  . Anxiety   . Arthritis   . Bilateral edema of lower extremity    right > left --- per pt cellulitis right lower leg 2016 , has had swelling since  . BPH (benign prostatic hyperplasia)   . Depression   . First degree AV block   . Hepatic cirrhosis, unspecified hepatic cirrhosis type ([name]) dx 2016   per pt non-alcoholic fatty liver---  pt established care w/ dr fields [facility])--  04-26-2017  liver ultrasound --   cirrohoisis ,  no asites  . History of adenomatous polyp of colon    2016  tubular adenoma's  (per dr fields note)  . Hyperlipidemia   . Hypertension   . Lower urinary tract symptoms (LUTS)   . Nocturia   . OSA (obstructive sleep apnea)    per pt has CPAP but has not used since Jan 2018-  hx sleep apnea surgery's  . RBBB (right bundle branch block)   . Type 2 diabetes mellitus ([place])    followed by pcp    Past Surgical History:  Procedure Laterality Date  . APPENDECTOMY  teen  . CATARACT EXTRACTION W/ INTRAOCULAR LENS  IMPLANT, BILATERAL  2017  . COLONOSCOPY  2016   multiple sessile polyps (tubular adenomas), diverticulosis in left colon, small angiodysplastic lesion in transverse and ascending colon.  . CYSTOSCOPY N/A 05/04/2017   Procedure: Evie Lacks DILITATION;  Surgeon: Irine Seal, MD;  Location: [facility];  Service: Urology;  Laterality: N/A;  . ESOPHAGOGASTRODUODENOSCOPY  06/2016   3 columns of medium-sized varices without stigmata of bleeding. Otherwise normal.  . NASAL SEPTUM SURGERY  x2  last one yrs ago  . PILONIDAL CYST EXCISION  x3  last one 1970's  . SHOULDER ARTHROSCOPY Right 2010 approx.  Marland Kitchen TOTAL KNEE ARTHROPLASTY  01/02/2012   Procedure: TOTAL KNEE ARTHROPLASTY;  Surgeon: Lorn Junes, MD;  Location: [facility];  Service: Orthopedics;  Laterality: Right;  . TOTAL KNEE ARTHROPLASTY Left [place] , [place]  . TOTAL KNEE REVISION Right 07/30/2018   Procedure: RIGHT TOTAL KNEE REVISION OF PATELLA  AND TBIAL BEARING;  Surgeon: Frederik Pear, MD;  Location: [facility];  Service: Orthopedics;  Laterality: Right;  Adductor Block  . TRANSURETHRAL RESECTION OF PROSTATE N/A 05/04/2017   Procedure: TRANSURETHRAL RESECTION  OF THE PROSTATE;  Surgeon: Irine Seal, MD;  Location: [facility];  Service: Urology;  Laterality: N/A;  . UVULOPALATOPHARYNGOPLASTY  ?unknown date   w/  bilateral Tonsillectomy    Current Outpatient Medications   Medication Sig Dispense Refill  . aspirin EC 81 MG tablet Take 1 tablet (81 mg total) by mouth 2 (two) times daily. (Patient taking differently: Take 81 mg by mouth daily. ) 60 tablet 0  . cetirizine (ZYRTEC) 10 MG tablet Take 10 mg by mouth every morning.     . citalopram (CELEXA) 20 MG tablet Take 20 mg by mouth at bedtime.     . cycloSPORINE (RESTASIS) 0.05 % ophthalmic emulsion 1 drop as needed.    . finasteride (PROSCAR) 5 MG tablet Take 5 mg by mouth every morning.     . furosemide (LASIX) 40 MG tablet Take 40 mg by mouth daily.     Marland Kitchen lisinopril (PRINIVIL,ZESTRIL) 20 MG tablet Take 20 mg by mouth daily.     . Melatonin 3 MG CAPS Take 3 mg by mouth at bedtime.    . metFORMIN (GLUCOPHAGE) 500 MG tablet Take 500 mg by mouth daily.     . mirtazapine (REMERON) 15 MG tablet Take 15 mg by mouth at bedtime.    . nadolol (CORGARD) 40 MG tablet TAKE 1 TABLET EVERY EVENING (Patient taking differently: Take 40 mg by mouth at bedtime. ) 90 tablet 3  . OVER THE COUNTER MEDICATION Apply 1 application topically daily as needed (for infection). Emuaid Cream    . fluticasone furoate-vilanterol (BREO ELLIPTA) 100-25 MCG/INH AEPB Inhale 1 puff into the lungs 2 (two) times daily.     No current facility-administered medications for this visit.     Allergies as of 05/17/2019 - Review Complete 05/17/2019  Allergen Reaction Noted  . Doxycycline Nausea And Vomiting 04/24/2018  . Flomax [tamsulosin] Other (See Comments) 04/27/2017  . Oxybutynin Nausea Only and Other (See Comments) 04/27/2017  . Adhesive [tape] Rash 12/19/2011    Family History  Problem Relation Age of Onset  . Alzheimer's disease Mother   . Liver cancer Father   . Arthritis Sister   . Arthritis Brother   . Colon cancer Neg Hx   . Colon polyps Neg Hx     Social History   Socioeconomic History  . Marital status: Divorced    Spouse name: Not on file  . Number of children: Not on file  . Years of education: Not on file  . Highest  education level: Not on file  Occupational History  . 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 Financial resource strain: Not on file  . Food insecurity    Worry: Not on file    Inability: Not on file  . Transportation needs    Medical: Not on file    Non-medical: Not on file  Tobacco Use  . Smoking status: Former Smoker    Years: 25.00    Types: Cigarettes    Quit date: 12/26/1992    Years since quitting: 26.4  . Smokeless tobacco: Former Systems developer    Types: Snuff    Quit date: 04/27/1993  Substance and Sexual Activity  . Alcohol use: No  . Drug  use: Never  . Sexual activity: Not on file  Lifestyle  . Physical activity    Days per week: Not on file    Minutes per session: Not on file  . Stress: Not on file  Relationships  . Social Herbalist on phone: Not on file    Gets together: Not on file    Attends religious service: Not on file    Active member of club or organization: Not on file    Attends meetings of clubs or organizations: Not on file    Relationship status: Not on file  Other Topics Concern  . Not on file  Social History Narrative  . Not on file    Review of Systems: Gen: Denies fever, chills, anorexia. Denies fatigue, weakness, weight loss.  CV: Denies chest pain, palpitations, syncope, peripheral edema, and claudication. Resp: Denies dyspnea at rest, cough, wheezing, coughing up blood, and pleurisy. GI: see HPI Derm: Denies rash, itching, dry skin Psych: Denies depression, anxiety, memory loss, confusion. No homicidal or suicidal ideation.  Heme: Denies bruising, bleeding, and enlarged lymph nodes.  Physical Exam: BP (!) 150/75   Pulse 61   Temp 97.8 F (36.6 C) (Temporal)   Ht 5\' 11"  (1.803 m)   Wt 271 lb 12.8 oz (123.3 kg)   BMI 37.91 kg/m  General:   Alert and oriented. No distress noted. Pleasant and cooperative.  Head:  Normocephalic and atraumatic. Eyes:  Conjuctiva clear without scleral  icterus. Mouth:  Oral mucosa pink and moist.  Lungs: clear bilaterally Cardiac: S1 S2 present with systolic murmur Abdomen:  +BS, soft, non-tender and non-distended. Obese, No rebound or guarding. Small umbilical hernia Msk:  Symmetrical without gross deformities. Normal posture. Extremities:  With chronic lower extremity edema Neurologic:  Alert and  oriented x4 Psych:  Alert and cooperative. Normal mood and affect.

## 2019-05-20 ENCOUNTER — Encounter: Payer: Self-pay | Admitting: *Deleted

## 2019-05-21 NOTE — Assessment & Plan Note (Signed)
78 year old male recently found to be heme positive through PCP's office, without any overt GI bleeding. Last colonoscopy in 2016 by Dr. Britta Mccreedy with multiple sessile polyps (tubular adenomas), diverticulosis in left colon, small angiodysplastic lesion in transverse and ascending colon. Discussed surveillance, and in this case diagnostic, colonoscopy at his age. Although multiple co-morbidities, he has compensated NASH cirrhosis and other medical conditions stable. He desires a colonoscopy after much discussion of risks and benefits.   Proceed with colonoscopy utilizing Propofol with Dr. Oneida Alar in the near future. The risks, benefits, and alternatives have been discussed in detail with the patient. They state understanding and desire to proceed.  No metformin day of procedure.

## 2019-05-21 NOTE — Progress Notes (Signed)
cc'ed to pcp °

## 2019-05-21 NOTE — Progress Notes (Signed)
Stable changes of cirrhosis. Repeat in 6 months.

## 2019-05-21 NOTE — Assessment & Plan Note (Addendum)
Compensated NASH cirrhosis, remaining on Nadolol for bleeding prophylaxis with last EGD in 2017. [place] up-to-date and due again in 6 months. Request blood work from PCP. Notes lower abdominal discomfort only with sitting and exacerbated by movement. Likely musculoskeletal in etiology. Pursue CT if persistent. Return in 6 months for follow-up.   ADDENDUM: labs from July 2020 received. Hgb 13.9, Hct 39.9, platelets 73, creatinine 1.29, BUN 17, Tbili 1.5, Alk Phos 73, ASt 41, ALT 28

## 2019-05-21 NOTE — Progress Notes (Signed)
LMOM to call.

## 2019-05-22 NOTE — Progress Notes (Signed)
ON RECALL  °

## 2019-05-23 DIAGNOSIS — Z299 Encounter for prophylactic measures, unspecified: Secondary | ICD-10-CM | POA: Diagnosis not present

## 2019-05-23 DIAGNOSIS — R197 Diarrhea, unspecified: Secondary | ICD-10-CM | POA: Diagnosis not present

## 2019-05-23 DIAGNOSIS — R600 Localized edema: Secondary | ICD-10-CM | POA: Diagnosis not present

## 2019-05-23 DIAGNOSIS — Z20828 Contact with and (suspected) exposure to other viral communicable diseases: Secondary | ICD-10-CM | POA: Diagnosis not present

## 2019-05-23 DIAGNOSIS — E1142 Type 2 diabetes mellitus with diabetic polyneuropathy: Secondary | ICD-10-CM | POA: Diagnosis not present

## 2019-05-23 DIAGNOSIS — Z6839 Body mass index (BMI) 39.0-39.9, adult: Secondary | ICD-10-CM | POA: Diagnosis not present

## 2019-05-23 DIAGNOSIS — I10 Essential (primary) hypertension: Secondary | ICD-10-CM | POA: Diagnosis not present

## 2019-05-28 DIAGNOSIS — N3941 Urge incontinence: Secondary | ICD-10-CM | POA: Diagnosis not present

## 2019-05-28 DIAGNOSIS — R3915 Urgency of urination: Secondary | ICD-10-CM | POA: Diagnosis not present

## 2019-06-07 DIAGNOSIS — E7800 Pure hypercholesterolemia, unspecified: Secondary | ICD-10-CM | POA: Diagnosis not present

## 2019-06-07 DIAGNOSIS — E119 Type 2 diabetes mellitus without complications: Secondary | ICD-10-CM | POA: Diagnosis not present

## 2019-06-07 DIAGNOSIS — I10 Essential (primary) hypertension: Secondary | ICD-10-CM | POA: Diagnosis not present

## 2019-06-07 DIAGNOSIS — M159 Polyosteoarthritis, unspecified: Secondary | ICD-10-CM | POA: Diagnosis not present

## 2019-06-18 DIAGNOSIS — R3915 Urgency of urination: Secondary | ICD-10-CM | POA: Diagnosis not present

## 2019-06-18 DIAGNOSIS — Z299 Encounter for prophylactic measures, unspecified: Secondary | ICD-10-CM | POA: Diagnosis not present

## 2019-06-18 DIAGNOSIS — E1142 Type 2 diabetes mellitus with diabetic polyneuropathy: Secondary | ICD-10-CM | POA: Diagnosis not present

## 2019-06-18 DIAGNOSIS — I10 Essential (primary) hypertension: Secondary | ICD-10-CM | POA: Diagnosis not present

## 2019-06-18 DIAGNOSIS — E1165 Type 2 diabetes mellitus with hyperglycemia: Secondary | ICD-10-CM | POA: Diagnosis not present

## 2019-06-18 DIAGNOSIS — Z6839 Body mass index (BMI) 39.0-39.9, adult: Secondary | ICD-10-CM | POA: Diagnosis not present

## 2019-06-18 DIAGNOSIS — N3941 Urge incontinence: Secondary | ICD-10-CM | POA: Diagnosis not present

## 2019-06-18 DIAGNOSIS — M25561 Pain in right knee: Secondary | ICD-10-CM | POA: Diagnosis not present

## 2019-06-25 DIAGNOSIS — E1159 Type 2 diabetes mellitus with other circulatory complications: Secondary | ICD-10-CM | POA: Diagnosis not present

## 2019-06-25 DIAGNOSIS — E1140 Type 2 diabetes mellitus with diabetic neuropathy, unspecified: Secondary | ICD-10-CM | POA: Diagnosis not present

## 2019-06-25 DIAGNOSIS — E1165 Type 2 diabetes mellitus with hyperglycemia: Secondary | ICD-10-CM | POA: Diagnosis not present

## 2019-06-26 IMAGING — US US ABDOMEN LIMITED
1 series · 14 of 25 positions shown · non-contrast
Comparison: No recent prior.

CLINICAL DATA: Cirrhosis.  Surveillance exam.

EXAM:
ULTRASOUND ABDOMEN LIMITED RIGHT UPPER QUADRANT

[Series 1: us abdomen limited · 0.25mm/px · 14 of 40 slices shown]
[im 1/40]
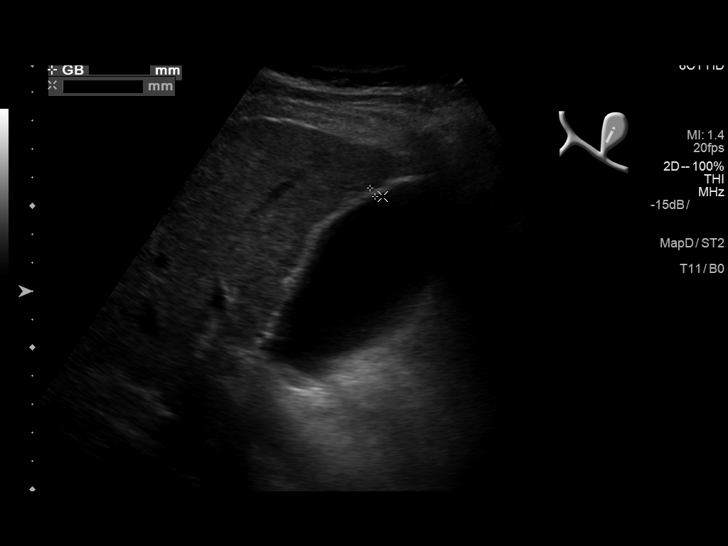
[im 4/40]
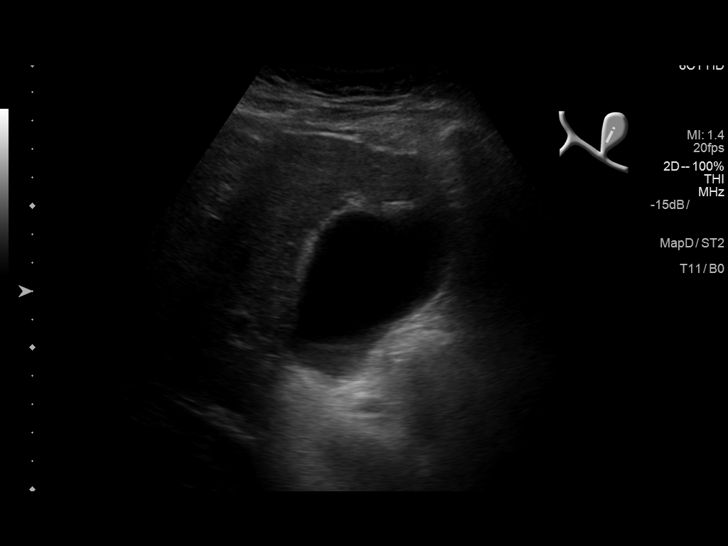
[im 7/40]
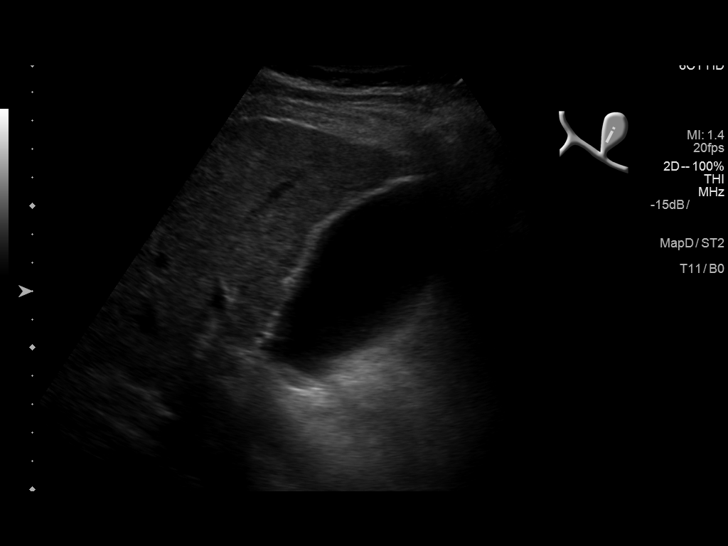
[im 10/40]
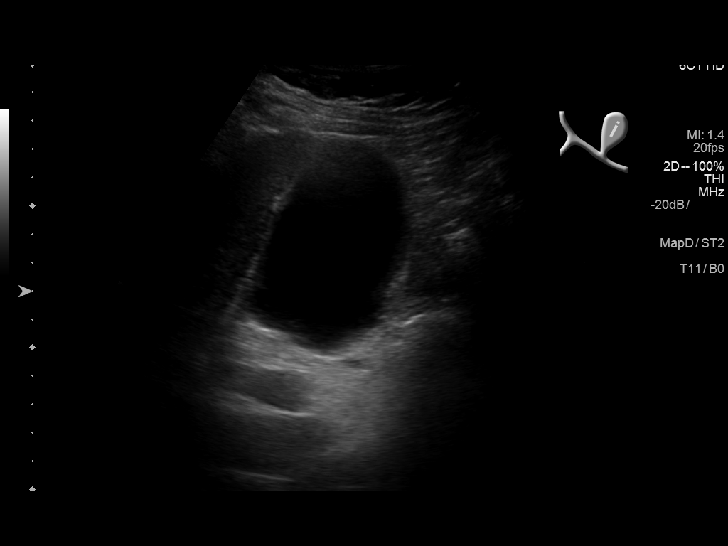
[im 14/40]
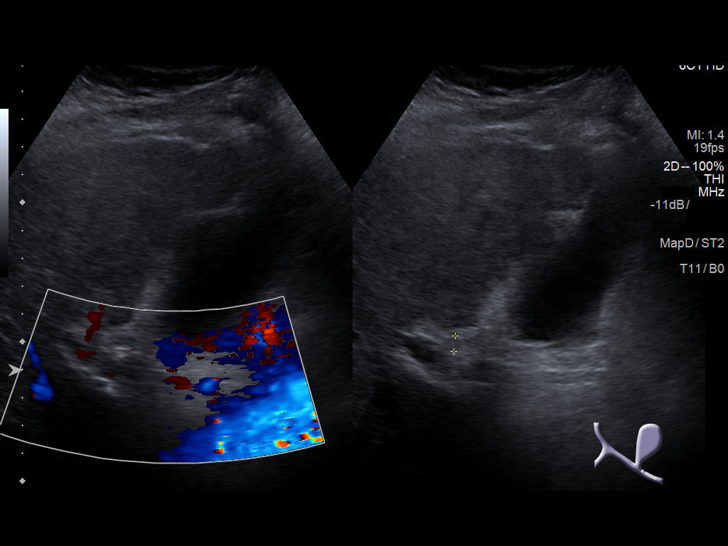
[im 15/40]
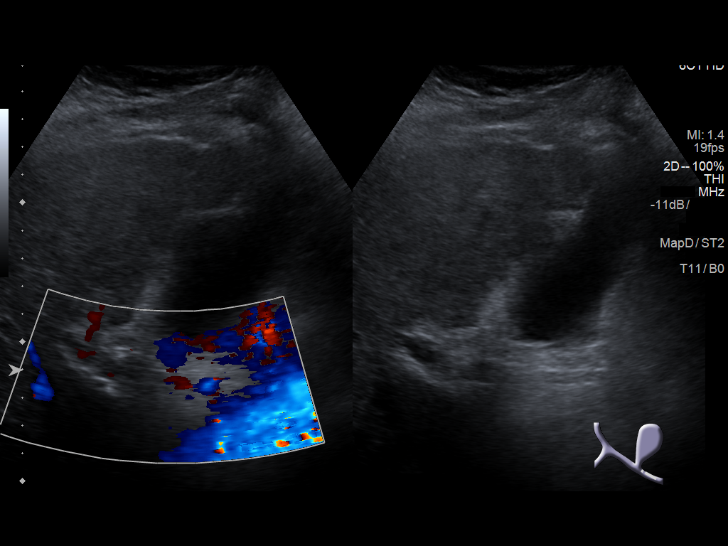
[im 18/40]
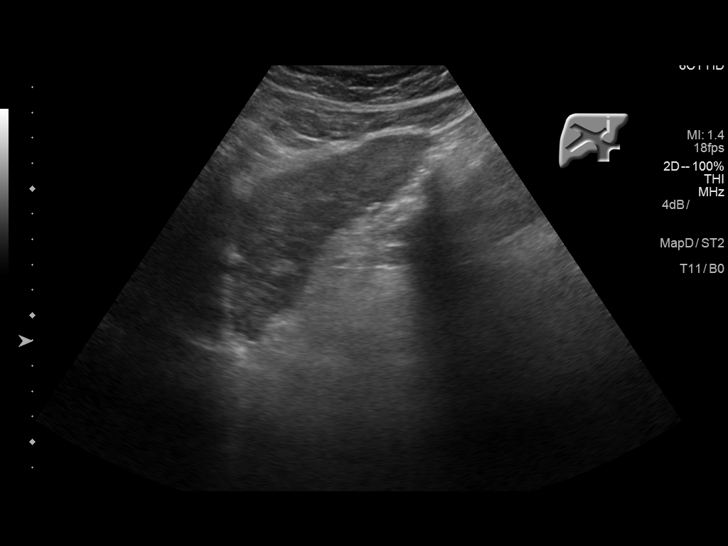
[im 22/40]
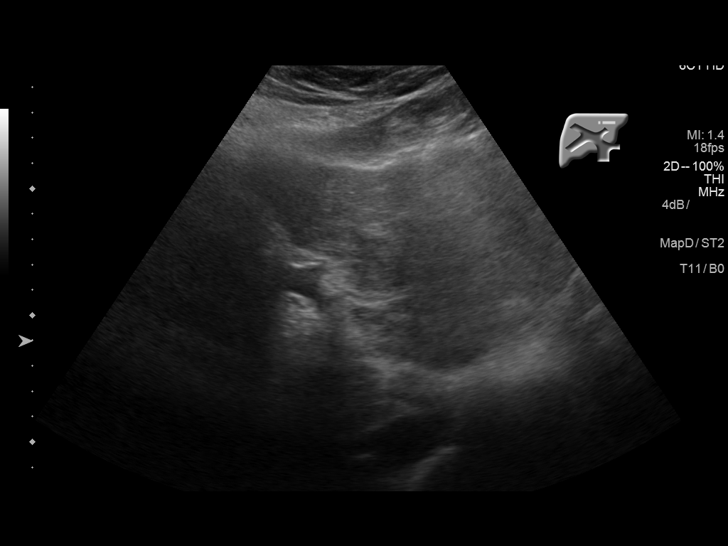
[im 25/40]
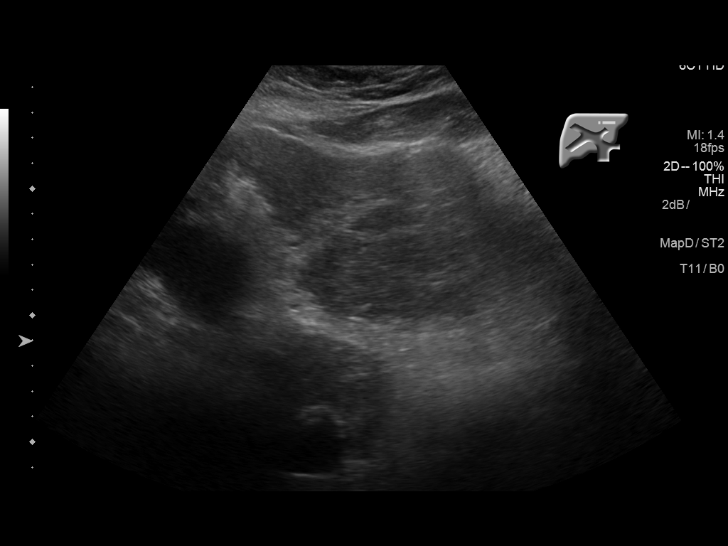
[im 27/40]
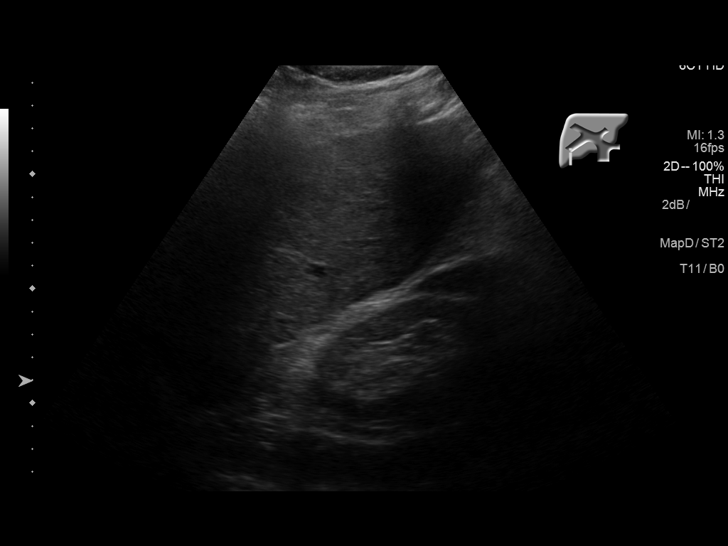
[im 30/40]
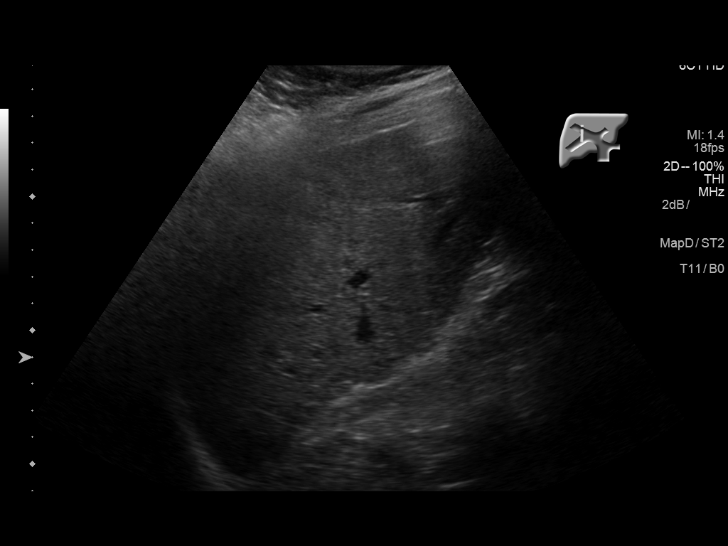
[im 33/40]
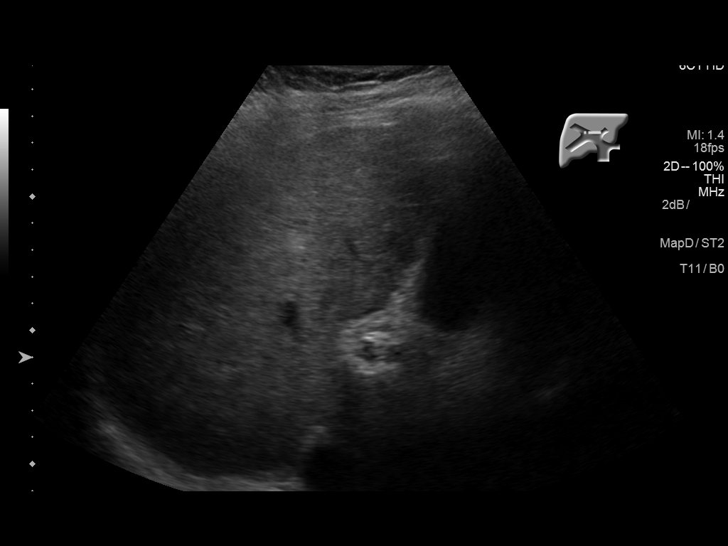
[im 36/40]
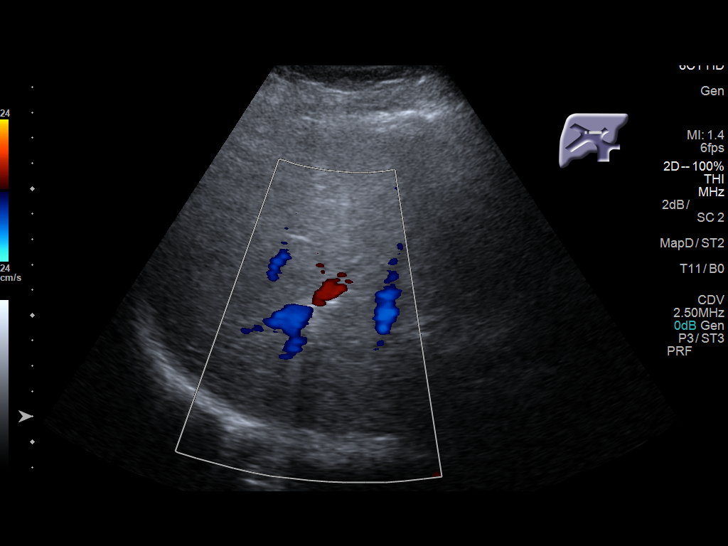
[im 40/40]
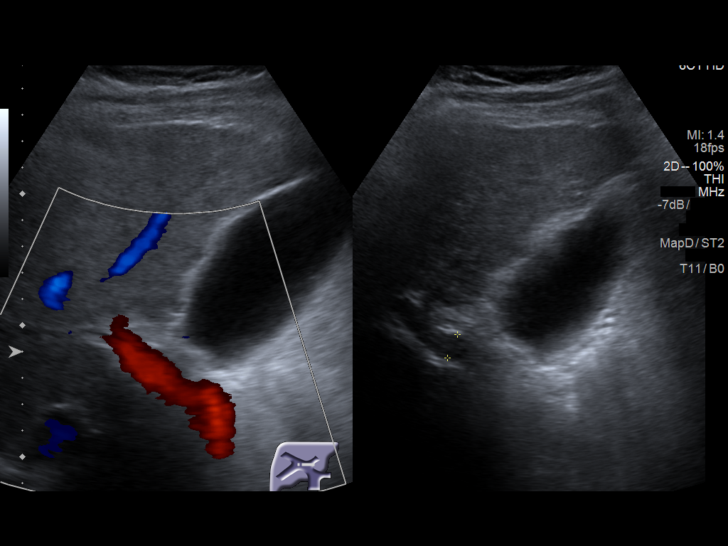

[14 of 25 positions shown; findings below may reference images not displayed]

FINDINGS: Gallbladder:

No gallstones or wall thickening visualized. No sonographic Murphy
sign noted by sonographer.

Common bile duct:

Diameter: 6 mm

Liver:

Heterogeneous hepatic echogenicity with nodular hepatic contour.
These findings consistent patient's known cirrhosis. Portal vein is
patent on color Doppler imaging with normal direction of blood flow
towards the liver.
IMPRESSION: 1. Heterogeneous hepatic echogenicity with nodular contour. These
findings are consistent patient's known cirrhosis.

2.  No gallstones or biliary distention.

## 2019-07-05 DIAGNOSIS — E1142 Type 2 diabetes mellitus with diabetic polyneuropathy: Secondary | ICD-10-CM | POA: Diagnosis not present

## 2019-07-05 DIAGNOSIS — I10 Essential (primary) hypertension: Secondary | ICD-10-CM | POA: Diagnosis not present

## 2019-07-05 DIAGNOSIS — Z6839 Body mass index (BMI) 39.0-39.9, adult: Secondary | ICD-10-CM | POA: Diagnosis not present

## 2019-07-05 DIAGNOSIS — Z299 Encounter for prophylactic measures, unspecified: Secondary | ICD-10-CM | POA: Diagnosis not present

## 2019-07-05 DIAGNOSIS — E1165 Type 2 diabetes mellitus with hyperglycemia: Secondary | ICD-10-CM | POA: Diagnosis not present

## 2019-07-11 ENCOUNTER — Other Ambulatory Visit: Payer: Self-pay | Admitting: Orthopedic Surgery

## 2019-07-11 DIAGNOSIS — Z96651 Presence of right artificial knee joint: Secondary | ICD-10-CM | POA: Diagnosis not present

## 2019-07-11 DIAGNOSIS — M25561 Pain in right knee: Secondary | ICD-10-CM | POA: Diagnosis not present

## 2019-07-16 DIAGNOSIS — N3941 Urge incontinence: Secondary | ICD-10-CM | POA: Diagnosis not present

## 2019-07-17 ENCOUNTER — Other Ambulatory Visit: Payer: Self-pay

## 2019-07-17 ENCOUNTER — Encounter (HOSPITAL_BASED_OUTPATIENT_CLINIC_OR_DEPARTMENT_OTHER): Payer: Self-pay | Admitting: *Deleted

## 2019-07-17 NOTE — Progress Notes (Signed)
Spoke w/ via phone for pre-op interview--- [place]---- I stat 8, ekg           COVID test ------07-18-2019 [place] Arrive at -------730 NPO after ------630 am (eras) Medications to take morning of surgery -----finasteride, certrizine Diabetic medication -----metformin, none day of surgery Patient Special Instructions ----- Pre-Op special Istructions ----- Patient verbalized understanding of instructions that were given at this phone interview. Patient denies shortness of breath, chest pain, fever, cough a this phone interview.

## 2019-07-18 ENCOUNTER — Other Ambulatory Visit (HOSPITAL_COMMUNITY)
Admission: RE | Admit: 2019-07-18 | Discharge: 2019-07-18 | Disposition: A | Discharge status: Home / Self Care | Payer: Medicare Other | Source: Ambulatory Visit | Attending: Orthopedic Surgery | Admitting: Orthopedic Surgery

## 2019-07-18 DIAGNOSIS — Z20828 Contact with and (suspected) exposure to other viral communicable diseases: Secondary | ICD-10-CM | POA: Diagnosis not present

## 2019-07-18 DIAGNOSIS — I4510 Unspecified right bundle-branch block: Secondary | ICD-10-CM | POA: Diagnosis not present

## 2019-07-18 DIAGNOSIS — Z01818 Encounter for other preprocedural examination: Secondary | ICD-10-CM | POA: Diagnosis not present

## 2019-07-18 LAB — SARS CORONAVIRUS 2 (TAT 6-24 HRS): SARS Coronavirus 2: NEGATIVE

## 2019-07-19 NOTE — H&P (Signed)
Jared Martinez is an 77 y.o. male.   Chief Complaint:  Right Knee Pain  HPI: Jared Martinez is here today for follow-up on right knee pain.  Recall, this patient is status post right total knee revision in October 2019.  At that time we upsized the patella from a 35-38 mm button on a Media planner.  He was seen on 03/28/19 at which time he had an aspiration and steroid injection for a possible fibrous band syndrome.  He states this injection provided at least 75% relief initially and now he only has a proximally 50% relief.  The patient was recently seen at [facility] and was instructed to follow-up with Dr. Mayer Camel concerning his continued knee pain.  We have discussed a knee arthroscopy in the past which he declined and elected to use watchful waiting.  He does use a hinged knee brace for comfort.  He denies any new injuries.  He denies any fevers chills night sweats or other signs of infection.    Past Medical History:  Diagnosis Date  . Anxiety   . Arthritis   . Bilateral edema of lower extremity    right > left --- per pt cellulitis right lower leg 2016 , has had swelling since  . BPH (benign prostatic hyperplasia)   . Depression   . First degree AV block   . Hepatic cirrhosis, unspecified hepatic cirrhosis type ([place]) dx 2016   per pt non-alcoholic fatty liver---  pt established care w/ dr fields [facility])--  04-26-2017  liver ultrasound --  cirrohoisis ,  no asites  . History of adenomatous polyp of colon    2016  tubular adenoma's  (per dr fields note)  . Hyperlipidemia   . Hypertension   . Lower urinary tract symptoms (LUTS)   . Nocturia   . OSA (obstructive sleep apnea)    per pt has CPAP but has not used since Jan 2018-  hx sleep apnea surgery's  . Pneumonia 02/27/2019   pt recovered now  . RBBB (right bundle branch block)   . Type 2 diabetes mellitus ([place])    followed by pcp    Past Surgical History:  Procedure Laterality Date  .  APPENDECTOMY  teen  . CATARACT EXTRACTION W/ INTRAOCULAR LENS  IMPLANT, BILATERAL  2017  . COLONOSCOPY  2016   multiple sessile polyps (tubular adenomas), diverticulosis in left colon, small angiodysplastic lesion in transverse and ascending colon.  . CYSTOSCOPY N/A 05/04/2017   Procedure: Evie Lacks DILITATION;  Surgeon: Irine Seal, MD;  Location: [facility];  Service: Urology;  Laterality: N/A;  . ESOPHAGOGASTRODUODENOSCOPY  06/2016   3 columns of medium-sized varices without stigmata of bleeding. Otherwise normal.  . NASAL SEPTUM SURGERY  x2  last one yrs ago  . PILONIDAL CYST EXCISION  x3  last one 1970's  . SHOULDER ARTHROSCOPY Right 2010 approx.   rotator repair  . TOTAL KNEE ARTHROPLASTY  01/02/2012   Procedure: TOTAL KNEE ARTHROPLASTY;  Surgeon: Lorn Junes, MD;  Location: [facility];  Service: Orthopedics;  Laterality: Right;  . TOTAL KNEE ARTHROPLASTY Left [place] , [place]  . TOTAL KNEE REVISION Right 07/30/2018   Procedure: RIGHT TOTAL KNEE REVISION OF PATELLA AND TBIAL BEARING;  Surgeon: Frederik Pear, MD;  Location: [facility];  Service: Orthopedics;  Laterality: Right;  Adductor Block  . TRANSURETHRAL RESECTION OF PROSTATE N/A 05/04/2017   Procedure: TRANSURETHRAL RESECTION  OF THE PROSTATE;  Surgeon:  Irine Seal, MD;  Location: [facility];  Service: Urology;  Laterality: N/A;  . UVULOPALATOPHARYNGOPLASTY  ?unknown date   w/  bilateral Tonsillectomy    Family History  Problem Relation Age of Onset  . Alzheimer's disease Mother   . Liver cancer Father   . Arthritis Sister   . Arthritis Brother   . Colon cancer Neg Hx   . Colon polyps Neg Hx    Social History:  reports that he quit smoking about 26 years ago. His smoking use included cigarettes. He quit after 25.00 years of use. He quit smokeless tobacco use about 26 years ago.  His smokeless tobacco use included snuff. He reports that he does not drink alcohol or use  drugs.  Allergies:  Allergies  Allergen Reactions  . Doxycycline Nausea And Vomiting  . Flomax [Tamsulosin] Other (See Comments)    "mouth breaks out"  . Oxybutynin Nausea Only and Other (See Comments)    "severe nausea"  . Adhesive [Tape] Rash    No medications prior to admission.    Results for orders placed or performed during the hospital encounter of 07/18/19 (from the past 48 hour(s))  SARS CORONAVIRUS 2 (TAT 6-24 HRS) Nasopharyngeal Nasopharyngeal Swab     Status: None   Collection Time: 07/18/19  7:15 AM   Specimen: Nasopharyngeal Swab  Result Value Ref Range   SARS Coronavirus 2 NEGATIVE NEGATIVE    Comment: (NOTE) SARS-CoV-2 target nucleic acids are NOT DETECTED. The SARS-CoV-2 RNA is generally detectable in upper and lower respiratory specimens during the acute phase of infection. Negative results do not preclude SARS-CoV-2 infection, do not rule out co-infections with other pathogens, and should not be used as the sole basis for treatment or other patient management decisions. Negative results must be combined with clinical observations, patient history, and epidemiological information. The expected result is Negative. Fact Sheet for Patients: SugarRoll.be Fact Sheet for Healthcare Providers: https://www.woods-mathews.com/ This test is not yet approved or cleared by the [place] FDA and  has been authorized for detection and/or diagnosis of SARS-CoV-2 by FDA under an Emergency Use Authorization (EUA). This EUA will remain  in effect (meaning this test can be used) for the duration of the COVID-19 declaration under Section 56 4(b)(1) of the Act, 21 U.S.C. section 360bbb-3(b)(1), unless the authorization is terminated or revoked sooner. Performed at [facility], [place] [address] [place], [place] [address]    No results found.  Review of Systems  Constitutional: Negative.   HENT: Positive for hearing loss  and tinnitus.   Eyes:       Glasses  Respiratory:       Sleep apnea  Cardiovascular:       HTN  Stroke  Gastrointestinal: Negative.   Genitourinary: Negative.   Musculoskeletal: Positive for joint pain.  Skin: Negative.   Neurological: Negative.   Endo/Heme/Allergies: Negative.   Psychiatric/Behavioral: Positive for depression.    Height 5\' 11"  (1.803 m), weight 124.7 kg. Physical Exam  Constitutional: He is oriented to person, place, and time. He appears well-developed and well-nourished.  HENT:  Head: Normocephalic and atraumatic.  Eyes: Pupils are equal, round, and reactive to light.  Neck: Normal range of motion. Neck supple.  Cardiovascular: Intact distal pulses.  Respiratory: Effort normal.  Musculoskeletal:        General: Tenderness present.     Comments: Today, the patient does have moderate edema 2+ in bilateral lower extremities.  He has mild effusion on the right  knee.  Overall his range of motion from roughly 0-115.  No instability.  Mild crepitus/pop crunch with the patient going into flexion.  Neurological: He is alert and oriented to person, place, and time.  Skin: Skin is warm and dry.  Psychiatric: He has a normal mood and affect. His behavior is normal. Judgment and thought content normal.     Assessment/Plan  Assess: Possible fibrous band syndrome revision right total knee arthroplasty with good initial relief from steroid injection  Plan: Options are once can discussed with the patient.  Again this patient does have a prior history of cirrhosis.  If the patient does wish to consider surgery this will need to be done at [facility] long outpatient surgery center or possibly the main hospital.  He will need to have clearance is from his primary care provider.  A posting slip has been completed and he is to discuss timing with Marlin Canary our surgery scheduler.  No medications were asked for or given today.  Benefits risks and potential complications of surgery  are discussed this includes infection and blood clots.  No medications were asked for or given today.  We anticipate seeing this patient back at the time of surgical intervention.  He is to call with any issues.  Again we reiterated that this would likely help with his knee pain but not do anything for his pain that radiates into his foot.  Joanell Rising, PA-C 07/19/2019, 12:46 PM

## 2019-07-22 ENCOUNTER — Ambulatory Visit (HOSPITAL_BASED_OUTPATIENT_CLINIC_OR_DEPARTMENT_OTHER): Payer: Medicare Other | Admitting: Anesthesiology

## 2019-07-22 ENCOUNTER — Other Ambulatory Visit: Payer: Self-pay

## 2019-07-22 ENCOUNTER — Encounter (HOSPITAL_BASED_OUTPATIENT_CLINIC_OR_DEPARTMENT_OTHER)
Admission: RE | Disposition: A | Discharge status: Home / Self Care | Payer: Self-pay | Source: Home / Self Care | Attending: Orthopedic Surgery

## 2019-07-22 ENCOUNTER — Ambulatory Visit (HOSPITAL_BASED_OUTPATIENT_CLINIC_OR_DEPARTMENT_OTHER)
Admission: RE | Admit: 2019-07-22 | Discharge: 2019-07-22 | Disposition: A | Discharge status: Home / Self Care | Payer: Medicare Other | Attending: Orthopedic Surgery | Admitting: Orthopedic Surgery

## 2019-07-22 ENCOUNTER — Encounter (HOSPITAL_BASED_OUTPATIENT_CLINIC_OR_DEPARTMENT_OTHER): Payer: Self-pay | Admitting: Anesthesiology

## 2019-07-22 DIAGNOSIS — Z888 Allergy status to other drugs, medicaments and biological substances status: Secondary | ICD-10-CM | POA: Insufficient documentation

## 2019-07-22 DIAGNOSIS — I10 Essential (primary) hypertension: Secondary | ICD-10-CM | POA: Insufficient documentation

## 2019-07-22 DIAGNOSIS — K760 Fatty (change of) liver, not elsewhere classified: Secondary | ICD-10-CM | POA: Insufficient documentation

## 2019-07-22 DIAGNOSIS — T8484XA Pain due to internal orthopedic prosthetic devices, implants and grafts, initial encounter: Secondary | ICD-10-CM | POA: Diagnosis present

## 2019-07-22 DIAGNOSIS — T8484XD Pain due to internal orthopedic prosthetic devices, implants and grafts, subsequent encounter: Secondary | ICD-10-CM

## 2019-07-22 DIAGNOSIS — G4733 Obstructive sleep apnea (adult) (pediatric): Secondary | ICD-10-CM | POA: Diagnosis not present

## 2019-07-22 DIAGNOSIS — T85828A Fibrosis due to other internal prosthetic devices, implants and grafts, initial encounter: Secondary | ICD-10-CM | POA: Diagnosis not present

## 2019-07-22 DIAGNOSIS — M238X1 Other internal derangements of right knee: Secondary | ICD-10-CM | POA: Diagnosis not present

## 2019-07-22 DIAGNOSIS — E119 Type 2 diabetes mellitus without complications: Secondary | ICD-10-CM | POA: Diagnosis not present

## 2019-07-22 DIAGNOSIS — Z87891 Personal history of nicotine dependence: Secondary | ICD-10-CM | POA: Insufficient documentation

## 2019-07-22 DIAGNOSIS — Z96651 Presence of right artificial knee joint: Secondary | ICD-10-CM | POA: Insufficient documentation

## 2019-07-22 DIAGNOSIS — E785 Hyperlipidemia, unspecified: Secondary | ICD-10-CM | POA: Diagnosis not present

## 2019-07-22 DIAGNOSIS — M1990 Unspecified osteoarthritis, unspecified site: Secondary | ICD-10-CM | POA: Insufficient documentation

## 2019-07-22 DIAGNOSIS — T8482XA Fibrosis due to internal orthopedic prosthetic devices, implants and grafts, initial encounter: Secondary | ICD-10-CM | POA: Diagnosis not present

## 2019-07-22 DIAGNOSIS — K7460 Unspecified cirrhosis of liver: Secondary | ICD-10-CM | POA: Insufficient documentation

## 2019-07-22 HISTORY — PX: KNEE ARTHROSCOPY: SHX127

## 2019-07-22 LAB — POCT I-STAT, CHEM 8
BUN: 21 mg/dL (ref 8–23)
Calcium, Ion: 1.2 mmol/L (ref 1.15–1.40)
Chloride: 102 mmol/L (ref 98–111)
Creatinine, Ser: 1.1 mg/dL (ref 0.61–1.24)
Glucose, Bld: 117 mg/dL — ABNORMAL HIGH (ref 70–99)
HCT: 48 % (ref 39.0–52.0)
Hemoglobin: 16.3 g/dL (ref 13.0–17.0)
Potassium: 5.9 mmol/L — ABNORMAL HIGH (ref 3.5–5.1)
Sodium: 139 mmol/L (ref 135–145)
TCO2: 32 mmol/L (ref 22–32)

## 2019-07-22 LAB — GLUCOSE, CAPILLARY: Glucose-Capillary: 114 mg/dL — ABNORMAL HIGH (ref 70–99)

## 2019-07-22 SURGERY — ARTHROSCOPY, KNEE
Anesthesia: General | Site: Knee | Laterality: Right

## 2019-07-22 MED ORDER — CEFAZOLIN SODIUM-DEXTROSE 2-4 GM/100ML-% IV SOLN
2.0000 g | INTRAVENOUS | Status: AC
  Administered 2019-07-22: 2 g via INTRAVENOUS
  Filled 2019-07-22: qty 100

## 2019-07-22 MED ORDER — OXYCODONE HCL 5 MG/5ML PO SOLN
5.0000 mg | Freq: Once | ORAL | Status: DC | PRN
  Filled 2019-07-22: qty 5

## 2019-07-22 MED ORDER — PROPOFOL 10 MG/ML IV BOLUS
INTRAVENOUS | Status: AC
  Filled 2019-07-22: qty 40

## 2019-07-22 MED ORDER — SODIUM CHLORIDE 0.9 % IR SOLN
Status: DC | PRN
  Administered 2019-07-22 (×2): 3000 mL

## 2019-07-22 MED ORDER — ONDANSETRON HCL 4 MG/2ML IJ SOLN
4.0000 mg | Freq: Once | INTRAMUSCULAR | Status: DC | PRN
  Filled 2019-07-22: qty 2

## 2019-07-22 MED ORDER — KETOROLAC TROMETHAMINE 30 MG/ML IJ SOLN
INTRAMUSCULAR | Status: DC | PRN
  Administered 2019-07-22: 30 mg via INTRAVENOUS

## 2019-07-22 MED ORDER — FENTANYL CITRATE (PF) 100 MCG/2ML IJ SOLN
25.0000 ug | INTRAMUSCULAR | Status: DC | PRN
  Filled 2019-07-22: qty 1

## 2019-07-22 MED ORDER — HYDROCODONE-ACETAMINOPHEN 5-325 MG PO TABS: 1.0000 | ORAL_TABLET | Freq: Four times a day (QID) | ORAL | 0 refills | Status: AC | PRN

## 2019-07-22 MED ORDER — BUPIVACAINE-EPINEPHRINE 0.5% -1:200000 IJ SOLN
INTRAMUSCULAR | Status: DC | PRN
  Administered 2019-07-22: 20 mL

## 2019-07-22 MED ORDER — KETOROLAC TROMETHAMINE 30 MG/ML IJ SOLN
INTRAMUSCULAR | Status: AC
  Filled 2019-07-22: qty 1

## 2019-07-22 MED ORDER — ACETAMINOPHEN 500 MG PO TABS
ORAL_TABLET | ORAL | Status: AC
  Filled 2019-07-22: qty 2

## 2019-07-22 MED ORDER — DEXAMETHASONE SODIUM PHOSPHATE 10 MG/ML IJ SOLN
INTRAMUSCULAR | Status: DC | PRN
  Administered 2019-07-22: 5 mg via INTRAVENOUS

## 2019-07-22 MED ORDER — OXYCODONE HCL 5 MG PO TABS
5.0000 mg | ORAL_TABLET | Freq: Once | ORAL | Status: DC | PRN
  Filled 2019-07-22: qty 1

## 2019-07-22 MED ORDER — LACTATED RINGERS IV SOLN
INTRAVENOUS | Status: DC
  Filled 2019-07-22: qty 1000

## 2019-07-22 MED ORDER — CEFAZOLIN SODIUM-DEXTROSE 2-4 GM/100ML-% IV SOLN
INTRAVENOUS | Status: AC
  Filled 2019-07-22: qty 100

## 2019-07-22 MED ORDER — EPINEPHRINE PF 1 MG/ML IJ SOLN
INTRAMUSCULAR | Status: DC | PRN
  Administered 2019-07-22 (×2): 1 mg

## 2019-07-22 MED ORDER — DEXAMETHASONE SODIUM PHOSPHATE 10 MG/ML IJ SOLN
INTRAMUSCULAR | Status: AC
  Filled 2019-07-22: qty 1

## 2019-07-22 MED ORDER — LIDOCAINE 2% (20 MG/ML) 5 ML SYRINGE
INTRAMUSCULAR | Status: DC | PRN
  Administered 2019-07-22: 100 mg via INTRAVENOUS

## 2019-07-22 MED ORDER — LACTATED RINGERS IV SOLN
INTRAVENOUS | Status: DC
  Administered 2019-07-22: 08:00:00 via INTRAVENOUS
  Filled 2019-07-22: qty 1000

## 2019-07-22 MED ORDER — ONDANSETRON HCL 4 MG/2ML IJ SOLN
INTRAMUSCULAR | Status: DC | PRN
  Administered 2019-07-22: 4 mg via INTRAVENOUS

## 2019-07-22 MED ORDER — PROPOFOL 10 MG/ML IV BOLUS
INTRAVENOUS | Status: DC | PRN
  Administered 2019-07-22: 50 mg via INTRAVENOUS
  Administered 2019-07-22: 150 mg via INTRAVENOUS

## 2019-07-22 MED ORDER — ACETAMINOPHEN 325 MG PO TABS
ORAL_TABLET | ORAL | Status: DC | PRN
  Administered 2019-07-22: 1000 mg via ORAL

## 2019-07-22 MED ORDER — CHLORHEXIDINE GLUCONATE 4 % EX LIQD
60.0000 mL | Freq: Once | CUTANEOUS | Status: DC
  Filled 2019-07-22: qty 118

## 2019-07-22 MED ORDER — ONDANSETRON HCL 4 MG/2ML IJ SOLN
INTRAMUSCULAR | Status: AC
  Filled 2019-07-22: qty 2

## 2019-07-22 MED ORDER — FENTANYL CITRATE (PF) 100 MCG/2ML IJ SOLN
INTRAMUSCULAR | Status: AC
  Filled 2019-07-22: qty 2

## 2019-07-22 MED ORDER — FENTANYL CITRATE (PF) 100 MCG/2ML IJ SOLN
INTRAMUSCULAR | Status: DC | PRN
  Administered 2019-07-22: 25 ug via INTRAVENOUS

## 2019-07-22 SURGICAL SUPPLY — 48 items
BANDAGE ELASTIC 6 VELCRO NS (GAUZE/BANDAGES/DRESSINGS) ×3 IMPLANT
BLADE CUTTER GATOR 3.5 (BLADE) ×3 IMPLANT
BNDG CMPR STD VLCR NS LF 5.8X6 (GAUZE/BANDAGES/DRESSINGS) ×1
BNDG ELASTIC 6X5.8 VLCR NS LF (GAUZE/BANDAGES/DRESSINGS) ×2 IMPLANT
COVER WAND RF STERILE (DRAPES) ×3 IMPLANT
CUFF TOURN SGL QUICK 42 (TOURNIQUET CUFF) ×2 IMPLANT
DRAPE ARTHROSCOPY W/POUCH 114 (DRAPES) ×3 IMPLANT
DRSG PAD ABDOMINAL 8X10 ST (GAUZE/BANDAGES/DRESSINGS) ×4 IMPLANT
DURAPREP 26ML APPLICATOR (WOUND CARE) ×3 IMPLANT
ELECT REM PT RETURN 9FT ADLT (ELECTROSURGICAL)
ELECTRODE REM PT RTRN 9FT ADLT (ELECTROSURGICAL) IMPLANT
EXCALIBUR 3.8MM X 13CM (MISCELLANEOUS) ×2 IMPLANT
GAUZE SPONGE 4X4 12PLY STRL (GAUZE/BANDAGES/DRESSINGS) ×3 IMPLANT
GAUZE XEROFORM 1X8 LF (GAUZE/BANDAGES/DRESSINGS) ×3 IMPLANT
GLOVE BIO SURGEON STRL SZ7 (GLOVE) ×2 IMPLANT
GLOVE BIO SURGEON STRL SZ8.5 (GLOVE) ×3 IMPLANT
GLOVE BIOGEL PI IND STRL 7.0 (GLOVE) IMPLANT
GLOVE BIOGEL PI IND STRL 9 (GLOVE) ×1 IMPLANT
GLOVE BIOGEL PI INDICATOR 7.0 (GLOVE) ×2
GLOVE BIOGEL PI INDICATOR 9 (GLOVE) ×2
GLOVE ECLIPSE 7.5 STRL STRAW (GLOVE) ×3 IMPLANT
GLOVE INDICATOR 8.0 STRL GRN (GLOVE) ×3 IMPLANT
GOWN STRL REUS W/ TWL LRG LVL3 (GOWN DISPOSABLE) IMPLANT
GOWN STRL REUS W/TWL LRG LVL3 (GOWN DISPOSABLE) ×6 IMPLANT
GOWN STRL REUS W/TWL XL LVL3 (GOWN DISPOSABLE) ×3 IMPLANT
KIT TURNOVER CYSTO (KITS) ×3 IMPLANT
KNEE WRAP E Z 3 GEL PACK (MISCELLANEOUS) ×3 IMPLANT
MANIFOLD NEPTUNE II (INSTRUMENTS) ×3 IMPLANT
NDL FILTER BLUNT 18X1 1/2 (NEEDLE) ×1 IMPLANT
NDL SAFETY ECLIPSE 18X1.5 (NEEDLE) ×1 IMPLANT
NEEDLE FILTER BLUNT 18X 1/2SAF (NEEDLE) ×2
NEEDLE FILTER BLUNT 18X1 1/2 (NEEDLE) ×1 IMPLANT
NEEDLE HYPO 18GX1.5 SHARP (NEEDLE) ×2
PACK ARTHROSCOPY DSU (CUSTOM PROCEDURE TRAY) ×3 IMPLANT
PACK BASIN DAY SURGERY FS (CUSTOM PROCEDURE TRAY) ×3 IMPLANT
PAD ABD 8X10 STRL (GAUZE/BANDAGES/DRESSINGS) ×2 IMPLANT
PAD CAST 3X4 CTTN HI CHSV (CAST SUPPLIES) IMPLANT
PADDING CAST COTTON 3X4 STRL (CAST SUPPLIES) ×3
PADDING CAST COTTON 6X4 STRL (CAST SUPPLIES) ×3 IMPLANT
PORT APPOLLO RF 90DEGREE MULTI (SURGICAL WAND) ×2 IMPLANT
PROBE BIPOLAR ATHRO 135MM 90D (MISCELLANEOUS) IMPLANT
SYR 10ML LL (SYRINGE) ×3 IMPLANT
TOWEL OR 17X26 10 PK STRL BLUE (TOWEL DISPOSABLE) ×3 IMPLANT
TUBE CONNECTING 12'X1/4 (SUCTIONS)
TUBE CONNECTING 12X1/4 (SUCTIONS) IMPLANT
UNDERPAD 30X30 (UNDERPADS AND DIAPERS) IMPLANT
WATER STERILE IRR 500ML POUR (IV SOLUTION) ×3 IMPLANT
WRAP KNEE MAXI GEL POST OP (GAUZE/BANDAGES/DRESSINGS) ×2 IMPLANT

## 2019-07-22 NOTE — Anesthesia Preprocedure Evaluation (Addendum)
Anesthesia Evaluation  Patient identified by MRN, date of birth, ID band Patient awake    Reviewed: Allergy & Precautions, NPO status , Patient's Chart, lab work & pertinent test results, reviewed documented beta blocker date and time   History of Anesthesia Complications (+) DIFFICULT IV STICK / SPECIAL LINENegative for: history of anesthetic complications  Airway Mallampati: II  TM Distance: >3 FB Neck ROM: Limited    Dental  (+) Teeth Intact, Dental Advisory Given   Pulmonary sleep apnea , former smoker,    Pulmonary exam normal        Cardiovascular hypertension, Pt. on medications and Pt. on home beta blockers Normal cardiovascular exam     Neuro/Psych PSYCHIATRIC DISORDERS Anxiety Depression negative neurological ROS     GI/Hepatic GERD  ,(+) Cirrhosis       ,   Endo/Other  diabetes, Type 2, Oral Hypoglycemic Agents  Renal/GU negative Renal ROS  negative genitourinary   Musculoskeletal  (+) Arthritis , Osteoarthritis,    Abdominal   Peds  Hematology negative hematology ROS (+)   Anesthesia Other Findings   Reproductive/Obstetrics                          Anesthesia Physical Anesthesia Plan  ASA: III  Anesthesia Plan: General   Post-op Pain Management:    Induction: Intravenous  PONV Risk Score and Plan: 2 and Ondansetron, Dexamethasone and Treatment may vary due to age or medical condition  Airway Management Planned: LMA  Additional Equipment: None  Intra-op Plan:   Post-operative Plan: Extubation in OR  Informed Consent: I have reviewed the patients History and Physical, chart, labs and discussed the procedure including the risks, benefits and alternatives for the proposed anesthesia with the patient or authorized representative who has indicated his/her understanding and acceptance.     Dental advisory given  Plan Discussed with:   Anesthesia Plan Comments:         Anesthesia Quick Evaluation

## 2019-07-22 NOTE — Interval H&P Note (Signed)
History and Physical Interval Note:  07/22/2019 7:29 AM  Jared Martinez  has presented today for surgery, with the diagnosis of [place].  The various methods of treatment have been discussed with the patient and family. After consideration of risks, benefits and other options for treatment, the patient has consented to  Procedure(s): RIGHT KNEE ARTHROSCOPY WITH REMOVAL OF FIBROUS BAND (Right) as a surgical intervention.  The patient's history has been reviewed, patient examined, no change in status, stable for surgery.  I have reviewed the patient's chart and labs.  Questions were answered to the patient's satisfaction.     [name]

## 2019-07-22 NOTE — Transfer of Care (Signed)
Immediate Anesthesia Transfer of Care Note  Patient: Jared Martinez  Procedure(s) Performed: RIGHT KNEE ARTHROSCOPY WITH REMOVAL OF FIBROUS BAND (Right Knee)  Patient Location: PACU  Anesthesia Type:General  Level of Consciousness: awake, alert , oriented and patient cooperative  Airway & Oxygen Therapy: Patient Spontanous Breathing and Patient connected to nasal cannula oxygen  Post-op Assessment: Report given to RN and Post -op Vital signs reviewed and stable  Post vital signs: Reviewed and stable  Last Vitals:  Vitals Value Taken Time  BP    Temp    Pulse 63 07/22/19 1004  Resp 14 07/22/19 1004  SpO2 91 % 07/22/19 1004  Vitals shown include unvalidated device data.  Last Pain:  Vitals:   07/22/19 0716  TempSrc: Oral         Complications: No apparent anesthesia complications

## 2019-07-22 NOTE — Anesthesia Postprocedure Evaluation (Signed)
Anesthesia Post Note  Patient: Jared Martinez  Procedure(s) Performed: RIGHT KNEE ARTHROSCOPY WITH REMOVAL OF FIBROUS BAND (Right Knee)     Patient location during evaluation: PACU Anesthesia Type: General Level of consciousness: awake and alert Pain management: pain level controlled Vital Signs Assessment: post-procedure vital signs reviewed and stable Respiratory status: spontaneous breathing, nonlabored ventilation and respiratory function stable Cardiovascular status: blood pressure returned to baseline and stable Postop Assessment: no apparent nausea or vomiting Anesthetic complications: no    Last Vitals:  Vitals:   07/22/19 1015 07/22/19 1030  BP: (!) 145/70 (!) 158/72  Pulse: (!) 56 (!) 57  Resp: (!) 21 10  Temp:    SpO2: 94% 92%    Last Pain:  Vitals:   07/22/19 1030  TempSrc:   PainSc: 0-No pain                 Lidia Collum

## 2019-07-22 NOTE — Discharge Instructions (Signed)

## 2019-07-22 NOTE — Anesthesia Procedure Notes (Signed)
Procedure Name: LMA Insertion Date/Time: 07/22/2019 9:13 AM Performed by: Wanita Chamberlain, CRNA Pre-anesthesia Checklist: Patient identified, Emergency Drugs available, Suction available and Patient being monitored Patient Re-evaluated:Patient Re-evaluated prior to induction Oxygen Delivery Method: Circle system utilized Preoxygenation: Pre-oxygenation with 100% oxygen Induction Type: IV induction Ventilation: Mask ventilation without difficulty LMA: LMA inserted LMA Size: 5.0 Number of attempts: 1

## 2019-07-22 NOTE — Op Note (Signed)
Pre-Op Dx: R TKA Fibrous Band  Postop Dx: Same  Procedure: Arthroscopic removal of fibrous bands peripatellar right total knee  Surgeon: Kathalene Frames. Mayer Camel M.D.  Assist: Kerry Hough. Barton Dubois  (present throughout entire procedure and necessary for timely completion of the procedure) Anes: General LMA  EBL: Minimal  Fluids: 800 cc  Antibiotics: Ancef 2 g IV  Indications: Status post right total knee done elsewhere some years ago with catching popping pain in the parapatellar region.  Good temporary relief from cortisone injection popping and pain has returned patient desires elective arthroscopic evaluation and treatment of the total knee for presumed snapping fibrous band. Risks and benefits of surgery have been discussed and questions answered.  Procedure: Patient identified by arm band and taken to the operating room at the day surgery Center. The appropriate anesthetic monitors were attached, and General LMA anesthesia was induced without difficulty. Lateral post was applied to the table and the lower extremity was prepped and draped in usual sterile fashion from the ankle to the midthigh. Time out procedure was performed. We began the operation by making standard inferior lateral and inferior medial peripatellar portals with a #11 blade allowing introduction of the arthroscope through the inferior lateral portal and the out flow to the inferior medial portal. Pump pressure was set at 100 mmHg and diagnostic arthroscopy  revealed parapatellar scar tissue that was straight superior as well as inferomedial and inferolateral.  Using a 3.8 mm Arthrex Excalibur sucker shaver the fibrous bands were removed switching between the inferomedial and inferolateral portal we also made a supplemental superior medial portal to get the superior fibrous band.  After removal all the tissues were cauterized with the ArthroCare wand to prevent any bleeding.  Pressure was taken down to 30 mm to document that there was no  active bleeding. The knee was irrigated out normal saline solution. A dressing of xerofoam 4 x 4 dressing sponges, web roll and an Ace wrap was applied. The patient was awakened extubated and taken to the recovery without difficulty.    Signed: [name], MD

## 2019-07-23 ENCOUNTER — Encounter (HOSPITAL_BASED_OUTPATIENT_CLINIC_OR_DEPARTMENT_OTHER): Payer: Self-pay | Admitting: Orthopedic Surgery

## 2019-08-05 DIAGNOSIS — E119 Type 2 diabetes mellitus without complications: Secondary | ICD-10-CM | POA: Diagnosis not present

## 2019-08-05 DIAGNOSIS — I10 Essential (primary) hypertension: Secondary | ICD-10-CM | POA: Diagnosis not present

## 2019-08-05 DIAGNOSIS — M159 Polyosteoarthritis, unspecified: Secondary | ICD-10-CM | POA: Diagnosis not present

## 2019-08-05 DIAGNOSIS — E7800 Pure hypercholesterolemia, unspecified: Secondary | ICD-10-CM | POA: Diagnosis not present

## 2019-08-06 NOTE — Patient Instructions (Signed)
EYAN GUSTAVESON  08/06/2019     @PREFPERIOPPHARMACY @   Your procedure is scheduled on  08/13/2019   Report to [facility] at  0700  A.M.  Call this number if you have problems the morning of surgery:  [phone]   Remember:  Follow the diet and prep instructions given to you by Dr Nona Dell office.                        Take these medicines the morning of surgery with A SIP OF WATER  Zyrtec, proscar, hydrodocone(if needed). DO NOT take any medications for diabetes the morning of your procedure.    Do not wear jewelry, make-up or nail polish.  Do not wear lotions, powders, or perfumes. Please wear deodorant and brush your teeth.  Do not shave 48 hours prior to surgery.  Men may shave face and neck.  Do not bring valuables to the hospital.  [facility] is not responsible for any belongings or valuables.  Contacts, dentures or bridgework may not be worn into surgery.  Leave your suitcase in the car.  After surgery it may be brought to your room.  For patients admitted to the hospital, discharge time will be determined by your treatment team.  Patients discharged the day of surgery will not be allowed to drive home.   Name and phone number of your driver:   family Special instructions:  None  Please read over the following fact sheets that you were given. Surgical Site Infection Prevention and Anesthesia Post-op Instructions       Colonoscopy, Adult, Care After This sheet gives you information about how to care for yourself after your procedure. Your health care provider may also give you more specific instructions. If you have problems or questions, contact your health care provider. What can I expect after the procedure? After the procedure, it is common to have:  A small amount of blood in your stool for 24 hours after the procedure.  Some gas.  Mild abdominal cramping or bloating. Follow these instructions at home: General instructions  For the first 24  hours after the procedure: ? Do not drive or use machinery. ? Do not sign important documents. ? Do not drink alcohol. ? Do your regular daily activities at a slower pace than normal. ? Eat soft, easy-to-digest foods.  Take over-the-counter or prescription medicines only as told by your health care provider. Relieving cramping and bloating   Try walking around when you have cramps or feel bloated.  Apply heat to your abdomen as told by your health care provider. Use a heat source that your health care provider recommends, such as a moist heat pack or a heating pad. ? Place a towel between your skin and the heat source. ? Leave the heat on for 20-30 minutes. ? Remove the heat if your skin turns bright red. This is especially important if you are unable to feel pain, heat, or cold. You may have a greater risk of getting burned. Eating and drinking   Drink enough fluid to keep your urine pale yellow.  Resume your normal diet as instructed by your health care provider. Avoid heavy or fried foods that are hard to digest.  Avoid drinking alcohol for as long as instructed by your health care provider. Contact a health care provider if:  You have blood in your stool 2-3 days after the procedure. Get help right away if:  You have more than a small spotting of blood in your stool.  You pass large blood clots in your stool.  Your abdomen is swollen.  You have nausea or vomiting.  You have a fever.  You have increasing abdominal pain that is not relieved with medicine. Summary  After the procedure, it is common to have a small amount of blood in your stool. You may also have mild abdominal cramping and bloating.  For the first 24 hours after the procedure, do not drive or use machinery, sign important documents, or drink alcohol.  Contact your health care provider if you have a lot of blood in your stool, nausea or vomiting, a fever, or increased abdominal pain. This information  is not intended to replace advice given to you by your health care provider. Make sure you discuss any questions you have with your health care provider. Document Released: 05/17/2004 Document Revised: 07/26/2017 Document Reviewed: 12/15/2015 Elsevier Patient Education  2020 [place] After These instructions provide you with information about caring for yourself after your procedure. Your health care provider may also give you more specific instructions. Your treatment has been planned according to current medical practices, but problems sometimes occur. Call your health care provider if you have any problems or questions after your procedure. What can I expect after the procedure? After your procedure, you may:  Feel sleepy for several hours.  Feel clumsy and have poor balance for several hours.  Feel forgetful about what happened after the procedure.  Have poor judgment for several hours.  Feel nauseous or vomit.  Have a sore throat if you had a breathing tube during the procedure. Follow these instructions at home: For at least 24 hours after the procedure:      Have a responsible adult stay with you. It is important to have someone help care for you until you are awake and alert.  Rest as needed.  Do not: ? Participate in activities in which you could fall or become injured. ? Drive. ? Use heavy machinery. ? Drink alcohol. ? Take sleeping pills or medicines that cause drowsiness. ? Make important decisions or sign legal documents. ? Take care of children on your own. Eating and drinking  Follow the diet that is recommended by your health care provider.  If you vomit, drink water, juice, or soup when you can drink without vomiting.  Make sure you have little or no nausea before eating solid foods. General instructions  Take over-the-counter and prescription medicines only as told by your health care provider.  If you have sleep  apnea, surgery and certain medicines can increase your risk for breathing problems. Follow instructions from your health care provider about wearing your sleep device: ? Anytime you are sleeping, including during daytime naps. ? While taking prescription pain medicines, sleeping medicines, or medicines that make you drowsy.  If you smoke, do not smoke without supervision.  Keep all follow-up visits as told by your health care provider. This is important. Contact a health care provider if:  You keep feeling nauseous or you keep vomiting.  You feel light-headed.  You develop a rash.  You have a fever. Get help right away if:  You have trouble breathing. Summary  For several hours after your procedure, you may feel sleepy and have poor judgment.  Have a responsible adult stay with you for at least 24 hours or until you are awake and alert. This information is not intended to  replace advice given to you by your health care provider. Make sure you discuss any questions you have with your health care provider. Document Released: 01/24/2016 Document Revised: 01/01/2018 Document Reviewed: 01/24/2016 Elsevier Patient Education  2020 [employer].

## 2019-08-07 DIAGNOSIS — Z23 Encounter for immunization: Secondary | ICD-10-CM | POA: Diagnosis not present

## 2019-08-09 ENCOUNTER — Encounter (HOSPITAL_COMMUNITY)
Admission: RE | Admit: 2019-08-09 | Discharge: 2019-08-09 | Disposition: A | Discharge status: Home / Self Care | Payer: Medicare Other | Source: Ambulatory Visit | Attending: Gastroenterology | Admitting: Gastroenterology

## 2019-08-09 ENCOUNTER — Other Ambulatory Visit: Payer: Self-pay

## 2019-08-09 ENCOUNTER — Encounter (HOSPITAL_COMMUNITY): Payer: Self-pay

## 2019-08-09 ENCOUNTER — Other Ambulatory Visit (HOSPITAL_COMMUNITY)
Admission: RE | Admit: 2019-08-09 | Discharge: 2019-08-09 | Disposition: A | Discharge status: Home / Self Care | Payer: Medicare Other | Source: Ambulatory Visit | Attending: Gastroenterology | Admitting: Gastroenterology

## 2019-08-09 DIAGNOSIS — K649 Unspecified hemorrhoids: Secondary | ICD-10-CM | POA: Insufficient documentation

## 2019-08-09 DIAGNOSIS — K5790 Diverticulosis of intestine, part unspecified, without perforation or abscess without bleeding: Secondary | ICD-10-CM | POA: Diagnosis not present

## 2019-08-09 DIAGNOSIS — Z01812 Encounter for preprocedural laboratory examination: Secondary | ICD-10-CM | POA: Insufficient documentation

## 2019-08-09 DIAGNOSIS — Z20822 Contact with and (suspected) exposure to covid-19: Secondary | ICD-10-CM

## 2019-08-09 DIAGNOSIS — K635 Polyp of colon: Secondary | ICD-10-CM | POA: Diagnosis not present

## 2019-08-09 LAB — COMPREHENSIVE METABOLIC PANEL
ALT: 26 U/L (ref 0–44)
AST: 40 U/L (ref 15–41)
Albumin: 3.4 g/dL — ABNORMAL LOW (ref 3.5–5.0)
Alkaline Phosphatase: 80 U/L (ref 38–126)
Anion gap: 7 (ref 5–15)
BUN: 19 mg/dL (ref 8–23)
CO2: 27 mmol/L (ref 22–32)
Calcium: 9.3 mg/dL (ref 8.9–10.3)
Chloride: 104 mmol/L (ref 98–111)
Creatinine, Ser: 1.06 mg/dL (ref 0.61–1.24)
GFR calc Af Amer: >60 mL/min (ref >60)
GFR calc non Af Amer: >60 mL/min (ref >60)
Glucose, Bld: 191 mg/dL — ABNORMAL HIGH (ref 70–99)
Potassium: 3.9 mmol/L (ref 3.5–5.1)
Sodium: 138 mmol/L (ref 135–145)
Total Bilirubin: 1.2 mg/dL (ref 0.3–1.2)
Total Protein: 6.6 g/dL (ref 6.5–8.1)

## 2019-08-09 LAB — CBC WITH DIFFERENTIAL/PLATELET
Abs Immature Granulocytes: 0 10*3/uL (ref 0.00–0.07)
Basophils Absolute: 0 10*3/uL (ref 0.0–0.1)
Basophils Relative: 1 %
Eosinophils Absolute: 0.2 10*3/uL (ref 0.0–0.5)
Eosinophils Relative: 6 %
HCT: 42.2 % (ref 39.0–52.0)
Hemoglobin: 13.6 g/dL (ref 13.0–17.0)
Immature Granulocytes: 0 %
Lymphocytes Relative: 42 %
Lymphs Abs: 1.3 10*3/uL (ref 0.7–4.0)
MCH: 32.8 pg (ref 26.0–34.0)
MCHC: 32.2 g/dL (ref 30.0–36.0)
MCV: 101.7 fL — ABNORMAL HIGH (ref 80.0–100.0)
Monocytes Absolute: 0.4 10*3/uL (ref 0.1–1.0)
Monocytes Relative: 14 %
Neutro Abs: 1.2 10*3/uL — ABNORMAL LOW (ref 1.7–7.7)
Neutrophils Relative %: 37 %
Platelets: 81 10*3/uL — ABNORMAL LOW (ref 150–400)
RBC: 4.15 MIL/uL — ABNORMAL LOW (ref 4.22–5.81)
RDW: 13.2 % (ref 11.5–15.5)
WBC: 3.1 10*3/uL — ABNORMAL LOW (ref 4.0–10.5)
nRBC: 0 % (ref 0.0–0.2)

## 2019-08-09 LAB — PROTIME-INR
INR: 1.2 (ref 0.8–1.2)
Prothrombin Time: 14.6 s (ref 11.4–15.2)

## 2019-08-11 LAB — NOVEL CORONAVIRUS, NAA: SARS-CoV-2, NAA: NOT DETECTED

## 2019-08-13 ENCOUNTER — Ambulatory Visit (HOSPITAL_COMMUNITY): Payer: Medicare Other | Admitting: Anesthesiology

## 2019-08-13 ENCOUNTER — Encounter (HOSPITAL_COMMUNITY): Payer: Self-pay

## 2019-08-13 ENCOUNTER — Other Ambulatory Visit: Payer: Self-pay

## 2019-08-13 ENCOUNTER — Ambulatory Visit (HOSPITAL_COMMUNITY)
Admission: RE | Admit: 2019-08-13 | Discharge: 2019-08-13 | Disposition: A | Discharge status: Home / Self Care | Payer: Medicare Other | Attending: Gastroenterology | Admitting: Gastroenterology

## 2019-08-13 ENCOUNTER — Encounter (HOSPITAL_COMMUNITY)
Admission: RE | Disposition: A | Discharge status: Home / Self Care | Payer: Self-pay | Source: Home / Self Care | Attending: Gastroenterology

## 2019-08-13 DIAGNOSIS — D124 Benign neoplasm of descending colon: Secondary | ICD-10-CM | POA: Insufficient documentation

## 2019-08-13 DIAGNOSIS — K644 Residual hemorrhoidal skin tags: Secondary | ICD-10-CM | POA: Insufficient documentation

## 2019-08-13 DIAGNOSIS — I10 Essential (primary) hypertension: Secondary | ICD-10-CM | POA: Diagnosis not present

## 2019-08-13 DIAGNOSIS — I8500 Esophageal varices without bleeding: Secondary | ICD-10-CM | POA: Insufficient documentation

## 2019-08-13 DIAGNOSIS — D122 Benign neoplasm of ascending colon: Secondary | ICD-10-CM | POA: Diagnosis not present

## 2019-08-13 DIAGNOSIS — Z86010 Personal history of colonic polyps: Secondary | ICD-10-CM | POA: Insufficient documentation

## 2019-08-13 DIAGNOSIS — K760 Fatty (change of) liver, not elsewhere classified: Secondary | ICD-10-CM | POA: Insufficient documentation

## 2019-08-13 DIAGNOSIS — D123 Benign neoplasm of transverse colon: Secondary | ICD-10-CM | POA: Insufficient documentation

## 2019-08-13 DIAGNOSIS — E785 Hyperlipidemia, unspecified: Secondary | ICD-10-CM | POA: Insufficient documentation

## 2019-08-13 DIAGNOSIS — N401 Enlarged prostate with lower urinary tract symptoms: Secondary | ICD-10-CM | POA: Insufficient documentation

## 2019-08-13 DIAGNOSIS — K921 Melena: Secondary | ICD-10-CM | POA: Insufficient documentation

## 2019-08-13 DIAGNOSIS — K648 Other hemorrhoids: Secondary | ICD-10-CM | POA: Diagnosis not present

## 2019-08-13 DIAGNOSIS — Z881 Allergy status to other antibiotic agents status: Secondary | ICD-10-CM | POA: Insufficient documentation

## 2019-08-13 DIAGNOSIS — K5730 Diverticulosis of large intestine without perforation or abscess without bleeding: Secondary | ICD-10-CM | POA: Diagnosis not present

## 2019-08-13 DIAGNOSIS — Z7982 Long term (current) use of aspirin: Secondary | ICD-10-CM | POA: Insufficient documentation

## 2019-08-13 DIAGNOSIS — M1990 Unspecified osteoarthritis, unspecified site: Secondary | ICD-10-CM | POA: Insufficient documentation

## 2019-08-13 DIAGNOSIS — R188 Other ascites: Secondary | ICD-10-CM | POA: Diagnosis not present

## 2019-08-13 DIAGNOSIS — E119 Type 2 diabetes mellitus without complications: Secondary | ICD-10-CM | POA: Diagnosis not present

## 2019-08-13 DIAGNOSIS — Z7984 Long term (current) use of oral hypoglycemic drugs: Secondary | ICD-10-CM | POA: Insufficient documentation

## 2019-08-13 DIAGNOSIS — Z87891 Personal history of nicotine dependence: Secondary | ICD-10-CM | POA: Insufficient documentation

## 2019-08-13 DIAGNOSIS — Z79899 Other long term (current) drug therapy: Secondary | ICD-10-CM | POA: Insufficient documentation

## 2019-08-13 DIAGNOSIS — K635 Polyp of colon: Secondary | ICD-10-CM | POA: Diagnosis not present

## 2019-08-13 DIAGNOSIS — I4510 Unspecified right bundle-branch block: Secondary | ICD-10-CM | POA: Diagnosis not present

## 2019-08-13 DIAGNOSIS — R195 Other fecal abnormalities: Secondary | ICD-10-CM

## 2019-08-13 DIAGNOSIS — K7460 Unspecified cirrhosis of liver: Secondary | ICD-10-CM | POA: Insufficient documentation

## 2019-08-13 DIAGNOSIS — Q438 Other specified congenital malformations of intestine: Secondary | ICD-10-CM | POA: Diagnosis not present

## 2019-08-13 DIAGNOSIS — F329 Major depressive disorder, single episode, unspecified: Secondary | ICD-10-CM | POA: Diagnosis not present

## 2019-08-13 DIAGNOSIS — R351 Nocturia: Secondary | ICD-10-CM | POA: Diagnosis not present

## 2019-08-13 DIAGNOSIS — G4733 Obstructive sleep apnea (adult) (pediatric): Secondary | ICD-10-CM | POA: Diagnosis not present

## 2019-08-13 DIAGNOSIS — F419 Anxiety disorder, unspecified: Secondary | ICD-10-CM | POA: Insufficient documentation

## 2019-08-13 DIAGNOSIS — Z96651 Presence of right artificial knee joint: Secondary | ICD-10-CM | POA: Insufficient documentation

## 2019-08-13 DIAGNOSIS — Z888 Allergy status to other drugs, medicaments and biological substances status: Secondary | ICD-10-CM | POA: Insufficient documentation

## 2019-08-13 DIAGNOSIS — D126 Benign neoplasm of colon, unspecified: Secondary | ICD-10-CM | POA: Diagnosis not present

## 2019-08-13 HISTORY — PX: POLYPECTOMY: SHX5525

## 2019-08-13 HISTORY — PX: COLONOSCOPY WITH PROPOFOL: SHX5780

## 2019-08-13 LAB — GLUCOSE, CAPILLARY: Glucose-Capillary: 111 mg/dL — ABNORMAL HIGH (ref 70–99)

## 2019-08-13 SURGERY — COLONOSCOPY WITH PROPOFOL
Anesthesia: General

## 2019-08-13 MED ORDER — LACTATED RINGERS IV SOLN
Freq: Once | INTRAVENOUS | Status: AC
  Administered 2019-08-13: 08:00:00 via INTRAVENOUS

## 2019-08-13 MED ORDER — KETAMINE HCL 10 MG/ML IJ SOLN
INTRAMUSCULAR | Status: DC | PRN
  Administered 2019-08-13: 20 mg via INTRAVENOUS

## 2019-08-13 MED ORDER — CHLORHEXIDINE GLUCONATE CLOTH 2 % EX PADS: 6.0000 | MEDICATED_PAD | Freq: Once | CUTANEOUS | Status: DC

## 2019-08-13 MED ORDER — LACTATED RINGERS IV SOLN
INTRAVENOUS | Status: DC | PRN
  Administered 2019-08-13: 08:00:00 via INTRAVENOUS

## 2019-08-13 MED ORDER — KETAMINE HCL 50 MG/5ML IJ SOSY
PREFILLED_SYRINGE | INTRAMUSCULAR | Status: AC
  Filled 2019-08-13: qty 5

## 2019-08-13 MED ORDER — PROPOFOL 10 MG/ML IV BOLUS
INTRAVENOUS | Status: AC
  Filled 2019-08-13: qty 40

## 2019-08-13 MED ORDER — PROPOFOL 500 MG/50ML IV EMUL
INTRAVENOUS | Status: DC | PRN
  Administered 2019-08-13: 120 ug/kg/min via INTRAVENOUS

## 2019-08-13 NOTE — Transfer of Care (Signed)
Immediate Anesthesia Transfer of Care Note  Patient: Jared Martinez  Procedure(s) Performed: COLONOSCOPY WITH PROPOFOL (N/A ) POLYPECTOMY  Patient Location: PACU  Anesthesia Type:MAC  Level of Consciousness: awake, alert , oriented and patient cooperative  Airway & Oxygen Therapy: Patient Spontanous Breathing  Post-op Assessment: Report given to RN, Post -op Vital signs reviewed and stable and Patient moving all extremities X 4  Post vital signs: Reviewed and stable  Last Vitals:  Vitals Value Taken Time  BP    Temp    Pulse 144 08/13/19 0915  Resp 10 08/13/19 0915  SpO2 93 % 08/13/19 0915  Vitals shown include unvalidated device data.  Last Pain:  Vitals:   08/13/19 0832  TempSrc:   PainSc: 0-No pain         Complications: No apparent anesthesia complications

## 2019-08-13 NOTE — Anesthesia Procedure Notes (Signed)
Procedure Name: General with mask airway Performed by: Jonna Munro, CRNA Pre-anesthesia Checklist: Timeout performed, Patient being monitored, Suction available, Emergency Drugs available and Patient identified Patient Re-evaluated:Patient Re-evaluated prior to induction Oxygen Delivery Method: Circle system utilized

## 2019-08-13 NOTE — Anesthesia Postprocedure Evaluation (Signed)
Anesthesia Post Note  Patient: Jared Martinez  Procedure(s) Performed: COLONOSCOPY WITH PROPOFOL (N/A ) POLYPECTOMY  Patient location during evaluation: PACU Anesthesia Type: MAC Level of consciousness: awake, oriented and awake and alert Pain management: pain level controlled Vital Signs Assessment: post-procedure vital signs reviewed and stable Respiratory status: nonlabored ventilation, spontaneous breathing and respiratory function stable Cardiovascular status: stable Postop Assessment: no apparent nausea or vomiting Anesthetic complications: no     Last Vitals:  Vitals:   08/13/19 0734  BP: (!) 171/92  Pulse: 62  Resp: 18  SpO2: 90%    Last Pain:  Vitals:   08/13/19 0832  TempSrc:   PainSc: 0-No pain                 Hollan Philipp

## 2019-08-13 NOTE — Anesthesia Preprocedure Evaluation (Signed)
Anesthesia Evaluation  Patient identified by MRN, date of birth, ID band Patient awake    Reviewed: Allergy & Precautions, NPO status , Patient's Chart, lab work & pertinent test results, reviewed documented beta blocker date and time   Airway Mallampati: III  TM Distance: >3 FB Neck ROM: Full    Dental no notable dental hx.    Pulmonary sleep apnea , pneumonia, former smoker,    Pulmonary exam normal breath sounds clear to auscultation       Cardiovascular hypertension, Pt. on medications and Pt. on home beta blockers Normal cardiovascular exam+ dysrhythmias  Rhythm:Regular Rate:Normal  22-Jul-2019 07:08:51 [facility] ROUTINE RECORD Sinus rhythm with 1st degree A-V block with Fusion complexes Right bundle branch block Abnormal ECG   Neuro/Psych PSYCHIATRIC DISORDERS Anxiety Depression    GI/Hepatic GERD  Medicated and Controlled,(+) Cirrhosis   Esophageal Varices and ascites    ,   Endo/Other  diabetes, Well Controlled, Type 2  Renal/GU      Musculoskeletal  (+) Arthritis , Osteoarthritis,    Abdominal (+) + obese,   Peds  Hematology  (+) anemia ,   Anesthesia Other Findings   Reproductive/Obstetrics                             Anesthesia Physical Anesthesia Plan  ASA: IV  Anesthesia Plan: General   Post-op Pain Management:    Induction: Intravenous  PONV Risk Score and Plan:   Airway Management Planned: Natural Airway, Nasal Cannula and Simple Face Mask  Additional Equipment:   Intra-op Plan:   Post-operative Plan:   Informed Consent: I have reviewed the patients History and Physical, chart, labs and discussed the procedure including the risks, benefits and alternatives for the proposed anesthesia with the patient or authorized representative who has indicated his/her understanding and acceptance.     Dental advisory given  Plan Discussed with:  CRNA  Anesthesia Plan Comments:         Anesthesia Quick Evaluation

## 2019-08-13 NOTE — Discharge Instructions (Signed)
You have internal hemorrhoids and diverticulosis IN YOUR LEFT AND RIGHT COLON. YOU HAD FIVE SMALL POLYPS REMOVED.    AVOID ASPIRIN UNLESS MEDICALLY NECESSARY. Your platelet count is low and it increases your risk for bleeding and ulcers.  DRINK WATER TO KEEP YOUR URINE LIGHT YELLOW.  CONTINUE YOUR WEIGHT LOSS EFFORTS. YOUR BODY MASS INDEX IS OVER 30 WHICH MEANS YOU ARE OBESE. OBESITY IS ASSOCIATED WITH AN INCREASE FOR ALL CANCERS, INCLUDING ESOPHAGEAL AND COLON CANCER.  FOLLOW A HIGH FIBER DIET. AVOID ITEMS THAT CAUSE BLOATING. See info below.   USE PREPARATION H FOUR TIMES  A DAY IF NEEDED TO RELIEVE RECTAL PAIN/PRESSURE/BLEEDING.   YOUR BIOPSY RESULTS WILL BE BACK IN 5 BUSINESS DAYS.  We do not routinely screen for polyps after the age of 40. YOU HAVE HAD AGE APPROPRIATE COLON CANCER SCREENING. YOU DO NOT NEED TO HAVE YOUR STOOL TESTED FOR BLOOD anymore.  Colonoscopy Care After Read the instructions outlined below and refer to this sheet in the next week. These discharge instructions provide you with general information on caring for yourself after you leave the hospital. While your treatment has been planned according to the most current medical practices available, unavoidable complications occasionally occur. If you have any problems or questions after discharge, call DR. Kerby Hockley, [phone].  ACTIVITY  You may resume your regular activity, but move at a slower pace for the next 24 hours.   Take frequent rest periods for the next 24 hours.   Walking will help get rid of the air and reduce the bloated feeling in your belly (abdomen).   No driving for 24 hours (because of the medicine (anesthesia) used during the test).   You may shower.   Do not sign any important legal documents or operate any machinery for 24 hours (because of the anesthesia used during the test).    NUTRITION  Drink plenty of fluids.   You may resume your normal diet as instructed by your doctor.    Begin with a light meal and progress to your normal diet. Heavy or fried foods are harder to digest and may make you feel sick to your stomach (nauseated).   Avoid alcoholic beverages for 24 hours or as instructed.    MEDICATIONS  You may resume your normal medications.   WHAT YOU CAN EXPECT TODAY  Some feelings of bloating in the abdomen.   Passage of more gas than usual.   Spotting of blood in your stool or on the toilet paper  .  IF YOU HAD POLYPS REMOVED DURING THE COLONOSCOPY:  Eat a soft diet IF YOU HAVE NAUSEA, BLOATING, ABDOMINAL PAIN, OR VOMITING.    FINDING OUT THE RESULTS OF YOUR TEST Not all test results are available during your visit. DR. Oneida Alar WILL CALL YOU WITHIN 14 DAYS OF YOUR PROCEDUE WITH YOUR RESULTS. Do not assume everything is normal if you have not heard from DR. Gloriann Riede, CALL HER OFFICE AT [phone].  SEEK IMMEDIATE MEDICAL ATTENTION AND CALL THE OFFICE: [phone] IF:  You have more than a spotting of blood in your stool.   Your belly is swollen (abdominal distention).   You are nauseated or vomiting.   You have a temperature over 101F.   You have abdominal pain or discomfort that is severe or gets worse throughout the day.  Polyps, Colon  A polyp is extra tissue that grows inside your body. Colon polyps grow in the large intestine. The large intestine, also called the colon, is part of  your digestive system. It is a long, hollow tube at the end of your digestive tract where your body makes and stores stool. Most polyps are not dangerous. They are benign. This means they are not cancerous. But over time, some types of polyps can turn into cancer. Polyps that are smaller than a pea are usually not harmful. But larger polyps could someday become or may already be cancerous. To be safe, doctors remove all polyps and test them.   PREVENTION There is not one sure way to prevent polyps. You might be able to lower your risk of getting them if  you:  Eat more fruits and vegetables and less fatty food.   Do not smoke.   Avoid alcohol.   Exercise every day.   Lose weight if you are overweight.   Eating more calcium and folate can also lower your risk of getting polyps. Some foods that are rich in calcium are milk, cheese, and broccoli. Some foods that are rich in folate are chickpeas, kidney beans, and spinach.    Diverticulosis Diverticulosis is a common condition that develops when small pouches (diverticula) form in the wall of the colon. The risk of diverticulosis increases with age. It happens more often in people who eat a low-fiber diet. Most individuals with diverticulosis have no symptoms. Those individuals with symptoms usually experience belly (abdominal) pain, constipation, or loose stools (diarrhea).  HOME CARE INSTRUCTIONS  Increase the amount of fiber in your diet as directed by your caregiver or dietician. This may reduce symptoms of diverticulosis.   Drink at least 6 to 8 glasses of water each day to prevent constipation.   Try not to strain when you have a bowel movement.   Avoiding nuts and seeds to prevent complications is NOT NECESSARY.   FOODS HAVING HIGH FIBER CONTENT INCLUDE:  Fruits. Apple, peach, pear, tangerine, raisins, prunes.   Vegetables. Brussels sprouts, asparagus, broccoli, cabbage, carrot, cauliflower, romaine lettuce, spinach, summer squash, tomato, winter squash, zucchini.   Starchy Vegetables. Baked beans, kidney beans, lima beans, split peas, lentils, potatoes (with skin).   SEEK IMMEDIATE MEDICAL CARE IF:  You develop increasing pain or severe bloating.   You have an oral temperature above 101F.   You develop vomiting or bowel movements that are bloody or black.

## 2019-08-13 NOTE — H&P (Signed)
Primary Care Physician:  Monico Blitz, MD Primary Gastroenterologist:  Dr. Oneida Alar  Pre-Procedure History & Physical: HPI:  Jared Martinez is a 78 y.o. male here for HEME POS STOOLS/ PERSONAL HISTORY OF POLYPS.  Past Medical History:  Diagnosis Date  . Anxiety   . Arthritis   . Bilateral edema of lower extremity    right > left --- per pt cellulitis right lower leg 2016 , has had swelling since  . BPH (benign prostatic hyperplasia)   . Depression   . First degree AV block   . Hepatic cirrhosis, unspecified hepatic cirrhosis type ([place]) dx 2016   per pt non-alcoholic fatty liver---  pt established care w/ dr  [facility])--  04-26-2017  liver ultrasound --  cirrohoisis ,  no asites  . History of adenomatous polyp of colon    2016  tubular adenoma's  (per dr  note)  . Hyperlipidemia   . Hypertension   . Lower urinary tract symptoms (LUTS)   . Nocturia   . OSA (obstructive sleep apnea)    per pt has CPAP but has not used since Jan 2018-  hx sleep apnea surgery's  . Pneumonia 02/27/2019   pt recovered now  . RBBB (right bundle branch block)   . Type 2 diabetes mellitus ([place])    followed by pcp    Past Surgical History:  Procedure Laterality Date  . APPENDECTOMY  teen  . CATARACT EXTRACTION W/ INTRAOCULAR LENS  IMPLANT, BILATERAL  2017  . COLONOSCOPY  2016   multiple sessile polyps (tubular adenomas), diverticulosis in left colon, small angiodysplastic lesion in transverse and ascending colon.  . CYSTOSCOPY N/A 05/04/2017   Procedure: Evie Lacks DILITATION;  Surgeon: Irine Seal, MD;  Location: [facility];  Service: Urology;  Laterality: N/A;  . ESOPHAGOGASTRODUODENOSCOPY  06/2016   3 columns of medium-sized varices without stigmata of bleeding. Otherwise normal.  . KNEE ARTHROSCOPY Right 07/22/2019   Procedure: RIGHT KNEE ARTHROSCOPY WITH REMOVAL OF FIBROUS BAND;  Surgeon: Frederik Pear, MD;  Location: [facility];  Service: Orthopedics;  Laterality: Right;  . NASAL SEPTUM SURGERY  x2  last one yrs ago  . PILONIDAL CYST EXCISION  x3  last one 1970's  . SHOULDER ARTHROSCOPY Right 2010 approx.   rotator repair  . TOTAL KNEE ARTHROPLASTY  01/02/2012   Procedure: TOTAL KNEE ARTHROPLASTY;  Surgeon: Lorn Junes, MD;  Location: [place];  Service: Orthopedics;  Laterality: Right;  . TOTAL KNEE ARTHROPLASTY Left [place] , [place]  . TOTAL KNEE REVISION Right 07/30/2018   Procedure: RIGHT TOTAL KNEE REVISION OF PATELLA AND TBIAL BEARING;  Surgeon: Frederik Pear, MD;  Location: [facility];  Service: Orthopedics;  Laterality: Right;  Adductor Block  . TRANSURETHRAL RESECTION OF PROSTATE N/A 05/04/2017   Procedure: TRANSURETHRAL RESECTION  OF THE PROSTATE;  Surgeon: Irine Seal, MD;  Location[facility];  Service: Urology;  Laterality: N/A;  . UVULOPALATOPHARYNGOPLASTY  ?unknown date   w/  bilateral Tonsillectomy    Prior to Admission medications   Medication Sig Start Date End Date Taking? Authorizing Provider  aspirin EC 81 MG tablet Take 1 tablet (81 mg total) by mouth 2 (two) times daily. Patient taking differently: Take 81 mg by mouth daily.  07/30/18  Yes Leighton Parody, PA-C  cetirizine (ZYRTEC) 10 MG tablet Take 10 mg by mouth every morning.    Yes [provider]  citalopram (CELEXA) 20 MG tablet Take  20 mg by mouth at bedtime.    Yes [provider]  furosemide (LASIX) 40 MG tablet Take 40 mg by mouth daily.   Yes [provider]  HYDROcodone-acetaminophen (NORCO) 5-325 MG tablet Take 1 tablet by mouth every 6 (six) hours as needed. 07/22/19  Yes Leighton Parody, PA-C  hydroxypropyl methylcellulose / hypromellose (ISOPTO TEARS / GONIOVISC) 2.5 % ophthalmic solution Place 1 drop into both eyes 3 (three) times daily as needed for dry eyes.   Yes [provider]  lisinopril (PRINIVIL,ZESTRIL) 20 MG tablet Take 20 mg by mouth daily.    Yes [provider]  Melatonin 5 MG TABS Take 5 mg by mouth at bedtime.    Yes [provider]  metFORMIN (GLUCOPHAGE) 500 MG tablet Take 500 mg by mouth daily.    Yes [provider]  mirtazapine (REMERON) 15 MG tablet Take 15 mg by mouth at bedtime.   Yes [provider]  nadolol (CORGARD) 40 MG tablet TAKE 1 TABLET EVERY EVENING Patient taking differently: Take 40 mg by mouth at bedtime.  10/27/17  Yes Mahala Menghini, PA-C  polyethylene glycol-electrolytes (TRILYTE) 420 g solution Take 4,000 mLs by mouth as directed. 05/17/19  Yes ,  L, MD  sodium chloride (OCEAN) 0.65 % SOLN nasal spray Place 1 spray into both nostrils as needed for congestion.   Yes [provider]    Allergies as of 05/17/2019 - Review Complete 05/17/2019  Allergen Reaction Noted  . Doxycycline Nausea And Vomiting 04/24/2018  . Flomax [tamsulosin] Other (See Comments) 04/27/2017  . Oxybutynin Nausea Only and Other (See Comments) 04/27/2017  . Adhesive [tape] Rash 12/19/2011    Family History  Problem Relation Age of Onset  . Alzheimer's disease Mother   . Liver cancer Father   . Arthritis Sister   . Arthritis Brother   . Colon cancer Neg Hx   . Colon polyps Neg Hx     Social History   Socioeconomic History  . Marital status: Divorced    Spouse name: Not on file  . Number of children: Not on file  . Years of education: Not on file  . Highest education level: Not on file  Occupational History  . 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 Financial resource strain: Not on file  . Food insecurity    Worry: Not on file    Inability: Not on file  . Transportation needs    Medical: Not on file    Non-medical: Not on file  Tobacco Use  . Smoking status: Former Smoker    Years: 25.00    Types: Cigarettes    Quit date: 12/26/1992    Years since quitting: 26.6  . Smokeless tobacco: Former Systems developer    Types: Snuff    Quit date:  04/27/1993  Substance and Sexual Activity  . Alcohol use: No  . Drug use: Never  . Sexual activity: Not on file  Lifestyle  . Physical activity    Days per week: Not on file    Minutes per session: Not on file  . Stress: Not on file  Relationships  . Social Herbalist on phone: Not on file    Gets together: Not on file    Attends religious service: Not on file    Active member of club or organization: Not on file    Attends meetings of clubs  or organizations: Not on file    Relationship status: Not on file  . Intimate partner violence    Fear of current or ex partner: Not on file    Emotionally abused: Not on file    Physically abused: Not on file    Forced sexual activity: Not on file  Other Topics Concern  . Not on file  Social History Narrative  . Not on file    Review of Systems: See HPI, otherwise negative ROS   Physical Exam: BP (!) 171/92   Pulse 62   Resp 18   SpO2 90%  General:   Alert,  pleasant and cooperative in NAD Head:  Normocephalic and atraumatic. Neck:  Supple; Lungs:  Clear throughout to auscultation.    Heart:  Regular rate and rhythm. Abdomen:  Soft, nontender and nondistended. Normal bowel sounds, without guarding, and without rebound.   Neurologic:  Alert and  oriented x4;  grossly normal neurologically.  Impression/Plan:     HEME POS STOOLS/ PERSONAL HISTORY OF POLYPS.  PLAN:  1.TCS today. DISCUSSED PROCEDURE, BENEFITS, & RISKS: < 1% chance of medication reaction, bleeding, perforation, ASPIRATION, or rupture of spleen/liver requiring surgery to fix it and missed polyps < 1 cm 10-20% of the time.

## 2019-08-13 NOTE — Op Note (Signed)
[facility] Patient Name: Jared Martinez Procedure Date: 08/13/2019 8:13 AM MRN: [id number] Date of Birth: 22-Mar-1941 Attending MD: Barney Drain MD, MD CSN: [id number] Age: 78 Admit Type: Outpatient Procedure:                Colonoscopy WITH COLD SNARE POLYPECTOMY Indications:              Heme positive stool, Personal history of colonic                            polyps Providers:                Barney Drain MD, MD, Janeece Riggers, RN, Otis Peak B.                            Sharon Seller, RN, Randa Spike, Technician Referring MD:             Fuller Canada Manuella Ghazi MD, MD Medicines:                Propofol per Anesthesia Complications:            No immediate complications. Estimated Blood Loss:     Estimated blood loss was minimal. Procedure:                Pre-Anesthesia Assessment:                           - Prior to the procedure, a History and Physical                            was performed, and patient medications and                            allergies were reviewed. The patient's tolerance of                            previous anesthesia was also reviewed. The risks                            and benefits of the procedure and the sedation                            options and risks were discussed with the patient.                            All questions were answered, and informed consent                            was obtained. Prior Anticoagulants: The patient has                            taken no previous anticoagulant or antiplatelet                            agents except for aspirin. ASA Grade Assessment: II                            -  A patient with mild systemic disease. After                            reviewing the risks and benefits, the patient was                            deemed in satisfactory condition to undergo the                            procedure. After obtaining informed consent, the                            colonoscope was passed under direct  vision.                            Throughout the procedure, the patient's blood                            pressure, pulse, and oxygen saturations were                            monitored continuously. The [id number]                            scope was introduced through the anus and advanced                            to the the cecum, identified by appendiceal orifice                            and ileocecal valve. The colonoscopy was somewhat                            difficult due to a tortuous colon. Successful                            completion of the procedure was aided by                            straightening and shortening the scope to obtain                            bowel loop reduction and COLOWRAP. The patient                            tolerated the procedure well. The quality of the                            bowel preparation was good. The ileocecal valve,                            appendiceal orifice, and rectum were photographed. Scope In: 8:40:31 AM Scope Out: 9:08:55 AM Scope Withdrawal Time: 0 hours 25 minutes 50 seconds  Total Procedure Duration: 0 hours 28 minutes 24 seconds  Findings:      Five sessile polyps were found in the descending colon, splenic flexure       and ascending colon(3). The polyps were 2 to 4 mm in size. These polyps       were removed with a cold snare. Resection and retrieval were complete.      Multiple small and large-mouthed diverticula were found in the       recto-sigmoid colon, sigmoid colon and descending colon.      External and internal hemorrhoids were found.      The recto-sigmoid colon and sigmoid colon were mildly tortuous. Impression:               - Five 2 to 4 mm polyps in the descending colon, at                            the splenic flexure and in the ascending colon,                            removed with a cold snare. Resected and retrieved.                           - MILD TO MODERATE Diverticulosis  in the                            recto-sigmoid colon, in the sigmoid colon and in                            the descending colon.                           - External and internal hemorrhoids.                           - Tortuous LEFT colon. Moderate Sedation:      Per Anesthesia Care Recommendation:           - Patient has a contact number available for                            emergencies. The signs and symptoms of potential                            delayed complications were discussed with the                            patient. Return to normal activities tomorrow.                            Written discharge instructions were provided to the                            patient.                           - High fiber diet.                           -  Continue present medications. STOP ASA UNLESS IT                            IS MEDICALLY NECESSARY. PT HAS THROMBOCYTOPENIA DUE                            TO CIRRHOSIS.                           - Await pathology results.                           - Repeat colonoscopy is not recommended due to                            current age (9 years or older) for surveillance.                            PT NO LONGER NEEDS COLON CANCER SCREENING HE IS                            OVER 75. Procedure Code(s):        --- Professional ---                           [phone], Colonoscopy, flexible; with removal of                            tumor(s), polyp(s), or other lesion(s) by snare                            technique Diagnosis Code(s):        --- Professional ---                           K63.5, Polyp of colon                           K64.8, Other hemorrhoids                           R19.5, Other fecal abnormalities                           Z86.010, Personal history of colonic polyps                           K57.30, Diverticulosis of large intestine without                            perforation or abscess without bleeding                            Q43.8, Other specified congenital malformations of                            intestine CPT copyright  2019 American Medical Association. All rights reserved. The codes documented in this report are preliminary and upon coder review may  be revised to meet current compliance requirements. Barney Drain, MD Barney Drain MD, MD 08/13/2019 9:28:43 AM This report has been signed electronically. Number of Addenda: 0

## 2019-08-14 LAB — SURGICAL PATHOLOGY

## 2019-08-16 ENCOUNTER — Encounter (HOSPITAL_COMMUNITY): Payer: Self-pay | Admitting: Gastroenterology

## 2019-08-19 NOTE — Progress Notes (Signed)
cc'd to pcp 

## 2019-08-31 DIAGNOSIS — F329 Major depressive disorder, single episode, unspecified: Secondary | ICD-10-CM | POA: Diagnosis present

## 2019-08-31 DIAGNOSIS — I4510 Unspecified right bundle-branch block: Secondary | ICD-10-CM | POA: Diagnosis not present

## 2019-08-31 DIAGNOSIS — I499 Cardiac arrhythmia, unspecified: Secondary | ICD-10-CM | POA: Diagnosis not present

## 2019-08-31 DIAGNOSIS — T796XXA Traumatic ischemia of muscle, initial encounter: Secondary | ICD-10-CM | POA: Diagnosis not present

## 2019-08-31 DIAGNOSIS — Y92002 Bathroom of unspecified non-institutional (private) residence single-family (private) house as the place of occurrence of the external cause: Secondary | ICD-10-CM | POA: Diagnosis not present

## 2019-08-31 DIAGNOSIS — N400 Enlarged prostate without lower urinary tract symptoms: Secondary | ICD-10-CM | POA: Diagnosis present

## 2019-08-31 DIAGNOSIS — R404 Transient alteration of awareness: Secondary | ICD-10-CM | POA: Diagnosis not present

## 2019-08-31 DIAGNOSIS — E871 Hypo-osmolality and hyponatremia: Secondary | ICD-10-CM | POA: Diagnosis not present

## 2019-08-31 DIAGNOSIS — R262 Difficulty in walking, not elsewhere classified: Secondary | ICD-10-CM | POA: Diagnosis not present

## 2019-08-31 DIAGNOSIS — Z66 Do not resuscitate: Secondary | ICD-10-CM | POA: Diagnosis not present

## 2019-08-31 DIAGNOSIS — R489 Unspecified symbolic dysfunctions: Secondary | ICD-10-CM | POA: Diagnosis not present

## 2019-08-31 DIAGNOSIS — Z7401 Bed confinement status: Secondary | ICD-10-CM | POA: Diagnosis not present

## 2019-08-31 DIAGNOSIS — D696 Thrombocytopenia, unspecified: Secondary | ICD-10-CM | POA: Diagnosis not present

## 2019-08-31 DIAGNOSIS — R1312 Dysphagia, oropharyngeal phase: Secondary | ICD-10-CM | POA: Diagnosis not present

## 2019-08-31 DIAGNOSIS — G9341 Metabolic encephalopathy: Secondary | ICD-10-CM | POA: Diagnosis present

## 2019-08-31 DIAGNOSIS — K7460 Unspecified cirrhosis of liver: Secondary | ICD-10-CM | POA: Diagnosis present

## 2019-08-31 DIAGNOSIS — S20229A Contusion of unspecified back wall of thorax, initial encounter: Secondary | ICD-10-CM | POA: Diagnosis present

## 2019-08-31 DIAGNOSIS — E161 Other hypoglycemia: Secondary | ICD-10-CM | POA: Diagnosis not present

## 2019-08-31 DIAGNOSIS — Z7984 Long term (current) use of oral hypoglycemic drugs: Secondary | ICD-10-CM | POA: Diagnosis not present

## 2019-08-31 DIAGNOSIS — E162 Hypoglycemia, unspecified: Secondary | ICD-10-CM | POA: Diagnosis not present

## 2019-08-31 DIAGNOSIS — Z20828 Contact with and (suspected) exposure to other viral communicable diseases: Secondary | ICD-10-CM | POA: Diagnosis present

## 2019-08-31 DIAGNOSIS — Z781 Physical restraint status: Secondary | ICD-10-CM | POA: Diagnosis not present

## 2019-08-31 DIAGNOSIS — R4182 Altered mental status, unspecified: Secondary | ICD-10-CM | POA: Diagnosis not present

## 2019-08-31 DIAGNOSIS — K7290 Hepatic failure, unspecified without coma: Secondary | ICD-10-CM | POA: Diagnosis present

## 2019-08-31 DIAGNOSIS — F0150 Vascular dementia without behavioral disturbance: Secondary | ICD-10-CM | POA: Diagnosis present

## 2019-08-31 DIAGNOSIS — U071 COVID-19: Secondary | ICD-10-CM | POA: Diagnosis not present

## 2019-08-31 DIAGNOSIS — I471 Supraventricular tachycardia: Secondary | ICD-10-CM | POA: Diagnosis present

## 2019-08-31 DIAGNOSIS — Z79899 Other long term (current) drug therapy: Secondary | ICD-10-CM | POA: Diagnosis not present

## 2019-08-31 DIAGNOSIS — T68XXXA Hypothermia, initial encounter: Secondary | ICD-10-CM | POA: Diagnosis present

## 2019-08-31 DIAGNOSIS — R296 Repeated falls: Secondary | ICD-10-CM | POA: Diagnosis not present

## 2019-08-31 DIAGNOSIS — E119 Type 2 diabetes mellitus without complications: Secondary | ICD-10-CM | POA: Diagnosis present

## 2019-08-31 DIAGNOSIS — M6282 Rhabdomyolysis: Secondary | ICD-10-CM | POA: Diagnosis present

## 2019-08-31 DIAGNOSIS — I10 Essential (primary) hypertension: Secondary | ICD-10-CM | POA: Diagnosis present

## 2019-08-31 DIAGNOSIS — M6281 Muscle weakness (generalized): Secondary | ICD-10-CM | POA: Diagnosis not present

## 2019-09-09 ENCOUNTER — Telehealth: Payer: Self-pay | Admitting: Gastroenterology

## 2019-09-09 NOTE — Telephone Encounter (Signed)
Pt's daughter, Johnette Abraham, called to see what we had left messages for. She had received the letter with the results from procedure and that is what I had left message for. She said her dad had a stroke recently and has lost most of his function, can speak very few words, and very little movement. She wanted to cancel any appointments he might have but at this time he was not scheduled any. Sending FYI to Dr. Oneida Alar and Roseanne Kaufman, NP.

## 2019-09-09 NOTE — Telephone Encounter (Signed)
REVIEWED. Sorry to hear he had a stroke.

## 2019-09-09 NOTE — Telephone Encounter (Signed)
Noted. Sorry to hear this.  

## 2019-09-09 NOTE — Telephone Encounter (Signed)
Pt's daughter, Helene Kelp, called asking for DS. She said DS was trying to get in touch with patient and she was the POA and that the patient had had a stroke. Please call [phone]

## 2019-09-11 DIAGNOSIS — I10 Essential (primary) hypertension: Secondary | ICD-10-CM | POA: Diagnosis not present

## 2019-09-11 DIAGNOSIS — B948 Sequelae of other specified infectious and parasitic diseases: Secondary | ICD-10-CM | POA: Diagnosis not present

## 2019-09-11 DIAGNOSIS — M6282 Rhabdomyolysis: Secondary | ICD-10-CM | POA: Diagnosis not present

## 2019-09-11 DIAGNOSIS — R4182 Altered mental status, unspecified: Secondary | ICD-10-CM | POA: Diagnosis not present

## 2019-09-11 DIAGNOSIS — U071 COVID-19: Secondary | ICD-10-CM | POA: Diagnosis not present

## 2019-09-11 DIAGNOSIS — R489 Unspecified symbolic dysfunctions: Secondary | ICD-10-CM | POA: Diagnosis not present

## 2019-09-11 DIAGNOSIS — L89300 Pressure ulcer of unspecified buttock, unstageable: Secondary | ICD-10-CM | POA: Diagnosis not present

## 2019-09-11 DIAGNOSIS — R262 Difficulty in walking, not elsewhere classified: Secondary | ICD-10-CM | POA: Diagnosis not present

## 2019-09-11 DIAGNOSIS — E1159 Type 2 diabetes mellitus with other circulatory complications: Secondary | ICD-10-CM | POA: Diagnosis not present

## 2019-09-11 DIAGNOSIS — R296 Repeated falls: Secondary | ICD-10-CM | POA: Diagnosis not present

## 2019-09-11 DIAGNOSIS — R1312 Dysphagia, oropharyngeal phase: Secondary | ICD-10-CM | POA: Diagnosis not present

## 2019-09-11 DIAGNOSIS — G9341 Metabolic encephalopathy: Secondary | ICD-10-CM | POA: Diagnosis not present

## 2019-09-11 DIAGNOSIS — Z7401 Bed confinement status: Secondary | ICD-10-CM | POA: Diagnosis not present

## 2019-09-11 DIAGNOSIS — M6281 Muscle weakness (generalized): Secondary | ICD-10-CM | POA: Diagnosis not present

## 2019-09-12 DIAGNOSIS — I10 Essential (primary) hypertension: Secondary | ICD-10-CM | POA: Diagnosis not present

## 2019-09-12 DIAGNOSIS — E1159 Type 2 diabetes mellitus with other circulatory complications: Secondary | ICD-10-CM | POA: Diagnosis not present

## 2019-09-12 DIAGNOSIS — L89300 Pressure ulcer of unspecified buttock, unstageable: Secondary | ICD-10-CM | POA: Diagnosis not present

## 2019-09-12 DIAGNOSIS — M6282 Rhabdomyolysis: Secondary | ICD-10-CM | POA: Diagnosis not present

## 2019-09-16 DIAGNOSIS — B948 Sequelae of other specified infectious and parasitic diseases: Secondary | ICD-10-CM | POA: Diagnosis not present

## 2019-09-16 DIAGNOSIS — U071 COVID-19: Secondary | ICD-10-CM | POA: Diagnosis not present

## 2019-09-19 DIAGNOSIS — F411 Generalized anxiety disorder: Secondary | ICD-10-CM | POA: Diagnosis not present

## 2019-09-19 DIAGNOSIS — U071 COVID-19: Secondary | ICD-10-CM | POA: Diagnosis not present

## 2019-09-19 DIAGNOSIS — F324 Major depressive disorder, single episode, in partial remission: Secondary | ICD-10-CM | POA: Diagnosis not present

## 2019-09-19 DIAGNOSIS — B948 Sequelae of other specified infectious and parasitic diseases: Secondary | ICD-10-CM | POA: Diagnosis not present

## 2019-09-19 DIAGNOSIS — R609 Edema, unspecified: Secondary | ICD-10-CM | POA: Diagnosis not present

## 2019-09-23 DIAGNOSIS — K7460 Unspecified cirrhosis of liver: Secondary | ICD-10-CM | POA: Diagnosis not present

## 2019-09-23 DIAGNOSIS — R609 Edema, unspecified: Secondary | ICD-10-CM | POA: Diagnosis not present

## 2019-09-30 DIAGNOSIS — B948 Sequelae of other specified infectious and parasitic diseases: Secondary | ICD-10-CM | POA: Diagnosis not present

## 2019-09-30 DIAGNOSIS — R609 Edema, unspecified: Secondary | ICD-10-CM | POA: Diagnosis not present

## 2019-09-30 DIAGNOSIS — U071 COVID-19: Secondary | ICD-10-CM | POA: Diagnosis not present

## 2019-10-01 ENCOUNTER — Encounter: Payer: Self-pay | Admitting: Gastroenterology

## 2019-10-01 DIAGNOSIS — K625 Hemorrhage of anus and rectum: Secondary | ICD-10-CM | POA: Diagnosis not present

## 2019-10-01 DIAGNOSIS — U071 COVID-19: Secondary | ICD-10-CM | POA: Diagnosis not present

## 2019-10-01 DIAGNOSIS — E875 Hyperkalemia: Secondary | ICD-10-CM | POA: Diagnosis not present

## 2019-10-01 DIAGNOSIS — S31811A Laceration without foreign body of right buttock, initial encounter: Secondary | ICD-10-CM | POA: Diagnosis not present

## 2019-10-01 DIAGNOSIS — S31821A Laceration without foreign body of left buttock, initial encounter: Secondary | ICD-10-CM | POA: Diagnosis not present

## 2019-10-03 DIAGNOSIS — R188 Other ascites: Secondary | ICD-10-CM | POA: Diagnosis not present

## 2019-10-03 DIAGNOSIS — N179 Acute kidney failure, unspecified: Secondary | ICD-10-CM | POA: Diagnosis not present

## 2019-10-03 DIAGNOSIS — R161 Splenomegaly, not elsewhere classified: Secondary | ICD-10-CM | POA: Diagnosis not present

## 2019-10-03 DIAGNOSIS — R7401 Elevation of levels of liver transaminase levels: Secondary | ICD-10-CM | POA: Diagnosis not present

## 2019-10-10 DIAGNOSIS — F0390 Unspecified dementia without behavioral disturbance: Secondary | ICD-10-CM | POA: Diagnosis not present

## 2019-10-10 DIAGNOSIS — U071 COVID-19: Secondary | ICD-10-CM | POA: Diagnosis not present

## 2019-10-10 DIAGNOSIS — N183 Chronic kidney disease, stage 3 unspecified: Secondary | ICD-10-CM | POA: Diagnosis not present

## 2019-10-10 DIAGNOSIS — J690 Pneumonitis due to inhalation of food and vomit: Secondary | ICD-10-CM | POA: Diagnosis not present

## 2019-10-10 DIAGNOSIS — B9689 Other specified bacterial agents as the cause of diseases classified elsewhere: Secondary | ICD-10-CM | POA: Diagnosis not present

## 2019-10-10 DIAGNOSIS — R0689 Other abnormalities of breathing: Secondary | ICD-10-CM | POA: Diagnosis not present

## 2019-10-10 DIAGNOSIS — L98419 Non-pressure chronic ulcer of buttock with unspecified severity: Secondary | ICD-10-CM | POA: Diagnosis not present

## 2019-10-10 DIAGNOSIS — E785 Hyperlipidemia, unspecified: Secondary | ICD-10-CM | POA: Diagnosis not present

## 2019-10-10 DIAGNOSIS — J1289 Other viral pneumonia: Secondary | ICD-10-CM | POA: Diagnosis not present

## 2019-10-10 DIAGNOSIS — N390 Urinary tract infection, site not specified: Secondary | ICD-10-CM | POA: Diagnosis not present

## 2019-10-10 DIAGNOSIS — I10 Essential (primary) hypertension: Secondary | ICD-10-CM | POA: Diagnosis not present

## 2019-10-10 DIAGNOSIS — N179 Acute kidney failure, unspecified: Secondary | ICD-10-CM | POA: Diagnosis not present

## 2019-10-11 DIAGNOSIS — R0689 Other abnormalities of breathing: Secondary | ICD-10-CM | POA: Diagnosis not present

## 2019-10-11 DIAGNOSIS — L98419 Non-pressure chronic ulcer of buttock with unspecified severity: Secondary | ICD-10-CM | POA: Diagnosis not present

## 2019-10-11 DIAGNOSIS — G9340 Encephalopathy, unspecified: Secondary | ICD-10-CM | POA: Diagnosis not present

## 2019-10-11 DIAGNOSIS — N390 Urinary tract infection, site not specified: Secondary | ICD-10-CM | POA: Diagnosis not present

## 2019-10-11 DIAGNOSIS — R627 Adult failure to thrive: Secondary | ICD-10-CM | POA: Diagnosis not present

## 2019-10-11 DIAGNOSIS — U071 COVID-19: Secondary | ICD-10-CM | POA: Diagnosis not present

## 2019-10-11 DIAGNOSIS — N183 Chronic kidney disease, stage 3 unspecified: Secondary | ICD-10-CM | POA: Diagnosis not present

## 2019-10-11 DIAGNOSIS — J1289 Other viral pneumonia: Secondary | ICD-10-CM | POA: Diagnosis not present

## 2019-10-11 DIAGNOSIS — N179 Acute kidney failure, unspecified: Secondary | ICD-10-CM | POA: Diagnosis not present

## 2019-10-11 DIAGNOSIS — E785 Hyperlipidemia, unspecified: Secondary | ICD-10-CM | POA: Diagnosis not present

## 2019-10-11 DIAGNOSIS — F0390 Unspecified dementia without behavioral disturbance: Secondary | ICD-10-CM | POA: Diagnosis not present

## 2019-10-11 DIAGNOSIS — B9689 Other specified bacterial agents as the cause of diseases classified elsewhere: Secondary | ICD-10-CM | POA: Diagnosis not present

## 2019-10-11 DIAGNOSIS — J690 Pneumonitis due to inhalation of food and vomit: Secondary | ICD-10-CM | POA: Diagnosis not present

## 2019-10-11 DIAGNOSIS — N170 Acute kidney failure with tubular necrosis: Secondary | ICD-10-CM | POA: Diagnosis not present

## 2019-10-11 DIAGNOSIS — I10 Essential (primary) hypertension: Secondary | ICD-10-CM | POA: Diagnosis not present

## 2019-10-12 DIAGNOSIS — N390 Urinary tract infection, site not specified: Secondary | ICD-10-CM | POA: Diagnosis not present

## 2019-10-12 DIAGNOSIS — E785 Hyperlipidemia, unspecified: Secondary | ICD-10-CM | POA: Diagnosis not present

## 2019-10-12 DIAGNOSIS — L98419 Non-pressure chronic ulcer of buttock with unspecified severity: Secondary | ICD-10-CM | POA: Diagnosis not present

## 2019-10-12 DIAGNOSIS — N179 Acute kidney failure, unspecified: Secondary | ICD-10-CM | POA: Diagnosis not present

## 2019-10-12 DIAGNOSIS — R5381 Other malaise: Secondary | ICD-10-CM | POA: Diagnosis not present

## 2019-10-12 DIAGNOSIS — I10 Essential (primary) hypertension: Secondary | ICD-10-CM | POA: Diagnosis not present

## 2019-10-12 DIAGNOSIS — R52 Pain, unspecified: Secondary | ICD-10-CM | POA: Diagnosis not present

## 2019-10-12 DIAGNOSIS — U071 COVID-19: Secondary | ICD-10-CM | POA: Diagnosis not present

## 2019-10-12 DIAGNOSIS — Z515 Encounter for palliative care: Secondary | ICD-10-CM | POA: Diagnosis not present

## 2019-10-12 DIAGNOSIS — R0689 Other abnormalities of breathing: Secondary | ICD-10-CM | POA: Diagnosis not present

## 2019-10-12 DIAGNOSIS — J690 Pneumonitis due to inhalation of food and vomit: Secondary | ICD-10-CM | POA: Diagnosis not present

## 2019-10-12 DIAGNOSIS — B9689 Other specified bacterial agents as the cause of diseases classified elsewhere: Secondary | ICD-10-CM | POA: Diagnosis not present

## 2019-10-12 DIAGNOSIS — Z79899 Other long term (current) drug therapy: Secondary | ICD-10-CM | POA: Diagnosis not present

## 2019-10-12 DIAGNOSIS — F0390 Unspecified dementia without behavioral disturbance: Secondary | ICD-10-CM | POA: Diagnosis not present

## 2019-10-12 DIAGNOSIS — J1289 Other viral pneumonia: Secondary | ICD-10-CM | POA: Diagnosis not present

## 2019-10-12 DIAGNOSIS — R451 Restlessness and agitation: Secondary | ICD-10-CM | POA: Diagnosis not present

## 2019-10-12 DIAGNOSIS — N183 Chronic kidney disease, stage 3 unspecified: Secondary | ICD-10-CM | POA: Diagnosis not present

## 2019-10-13 DIAGNOSIS — Z515 Encounter for palliative care: Secondary | ICD-10-CM | POA: Diagnosis not present

## 2019-10-13 DIAGNOSIS — R5381 Other malaise: Secondary | ICD-10-CM | POA: Diagnosis not present

## 2019-10-13 DIAGNOSIS — N183 Chronic kidney disease, stage 3 unspecified: Secondary | ICD-10-CM | POA: Diagnosis not present

## 2019-10-13 DIAGNOSIS — Z79899 Other long term (current) drug therapy: Secondary | ICD-10-CM | POA: Diagnosis not present

## 2019-10-13 DIAGNOSIS — N179 Acute kidney failure, unspecified: Secondary | ICD-10-CM | POA: Diagnosis not present

## 2019-10-14 DIAGNOSIS — Z515 Encounter for palliative care: Secondary | ICD-10-CM | POA: Diagnosis not present

## 2019-10-14 DIAGNOSIS — F419 Anxiety disorder, unspecified: Secondary | ICD-10-CM | POA: Diagnosis not present

## 2019-10-14 DIAGNOSIS — N179 Acute kidney failure, unspecified: Secondary | ICD-10-CM | POA: Diagnosis not present

## 2019-10-14 DIAGNOSIS — Z79899 Other long term (current) drug therapy: Secondary | ICD-10-CM | POA: Diagnosis not present

## 2019-10-14 DIAGNOSIS — R0902 Hypoxemia: Secondary | ICD-10-CM | POA: Diagnosis not present

## 2019-10-14 DIAGNOSIS — R5381 Other malaise: Secondary | ICD-10-CM | POA: Diagnosis not present

## 2019-10-15 DIAGNOSIS — Z515 Encounter for palliative care: Secondary | ICD-10-CM | POA: Diagnosis not present

## 2019-10-15 DIAGNOSIS — R52 Pain, unspecified: Secondary | ICD-10-CM | POA: Diagnosis not present

## 2019-10-15 DIAGNOSIS — R5381 Other malaise: Secondary | ICD-10-CM | POA: Diagnosis not present

## 2019-10-15 DIAGNOSIS — Z79899 Other long term (current) drug therapy: Secondary | ICD-10-CM | POA: Diagnosis not present

## 2019-10-15 DIAGNOSIS — R0600 Dyspnea, unspecified: Secondary | ICD-10-CM | POA: Diagnosis not present

## 2019-10-15 DIAGNOSIS — N179 Acute kidney failure, unspecified: Secondary | ICD-10-CM | POA: Diagnosis not present

## 2019-10-15 DIAGNOSIS — L89159 Pressure ulcer of sacral region, unspecified stage: Secondary | ICD-10-CM | POA: Diagnosis not present

## 2019-10-16 DIAGNOSIS — R0600 Dyspnea, unspecified: Secondary | ICD-10-CM | POA: Diagnosis not present

## 2019-10-16 DIAGNOSIS — R5381 Other malaise: Secondary | ICD-10-CM | POA: Diagnosis not present

## 2019-10-16 DIAGNOSIS — Z79899 Other long term (current) drug therapy: Secondary | ICD-10-CM | POA: Diagnosis not present

## 2019-10-16 DIAGNOSIS — Z515 Encounter for palliative care: Secondary | ICD-10-CM | POA: Diagnosis not present

## 2019-10-16 DIAGNOSIS — R52 Pain, unspecified: Secondary | ICD-10-CM | POA: Diagnosis not present

## 2019-10-16 DIAGNOSIS — L89159 Pressure ulcer of sacral region, unspecified stage: Secondary | ICD-10-CM | POA: Diagnosis not present

## 2019-10-16 DIAGNOSIS — N179 Acute kidney failure, unspecified: Secondary | ICD-10-CM | POA: Diagnosis not present

## 2019-10-17 DIAGNOSIS — Z79899 Other long term (current) drug therapy: Secondary | ICD-10-CM | POA: Diagnosis not present

## 2019-10-17 DIAGNOSIS — L89159 Pressure ulcer of sacral region, unspecified stage: Secondary | ICD-10-CM | POA: Diagnosis not present

## 2019-10-17 DIAGNOSIS — R5381 Other malaise: Secondary | ICD-10-CM | POA: Diagnosis not present

## 2019-10-17 DIAGNOSIS — Z515 Encounter for palliative care: Secondary | ICD-10-CM | POA: Diagnosis not present

## 2019-10-17 DIAGNOSIS — N179 Acute kidney failure, unspecified: Secondary | ICD-10-CM | POA: Diagnosis not present

## 2019-10-17 DIAGNOSIS — R0600 Dyspnea, unspecified: Secondary | ICD-10-CM | POA: Diagnosis not present

## 2019-10-17 DIAGNOSIS — R52 Pain, unspecified: Secondary | ICD-10-CM | POA: Diagnosis not present

## 2019-10-18 DIAGNOSIS — Z79899 Other long term (current) drug therapy: Secondary | ICD-10-CM | POA: Diagnosis not present

## 2019-10-18 DIAGNOSIS — N179 Acute kidney failure, unspecified: Secondary | ICD-10-CM | POA: Diagnosis not present

## 2019-10-18 DIAGNOSIS — R5381 Other malaise: Secondary | ICD-10-CM | POA: Diagnosis not present

## 2019-10-18 DIAGNOSIS — Z515 Encounter for palliative care: Secondary | ICD-10-CM | POA: Diagnosis not present

## 2019-10-18 DIAGNOSIS — L89159 Pressure ulcer of sacral region, unspecified stage: Secondary | ICD-10-CM | POA: Diagnosis not present

## 2019-10-19 DIAGNOSIS — Z79899 Other long term (current) drug therapy: Secondary | ICD-10-CM | POA: Diagnosis not present

## 2019-10-19 DIAGNOSIS — N179 Acute kidney failure, unspecified: Secondary | ICD-10-CM | POA: Diagnosis not present

## 2019-10-19 DIAGNOSIS — F419 Anxiety disorder, unspecified: Secondary | ICD-10-CM | POA: Diagnosis not present

## 2019-10-19 DIAGNOSIS — R5381 Other malaise: Secondary | ICD-10-CM | POA: Diagnosis not present

## 2019-10-19 DIAGNOSIS — Z515 Encounter for palliative care: Secondary | ICD-10-CM | POA: Diagnosis not present

## 2019-11-18 DEATH — deceased

## 2019-11-19 ENCOUNTER — Telehealth: Payer: Self-pay | Admitting: Gastroenterology

## 2019-11-19 NOTE — Telephone Encounter (Signed)
RECALL FOR ULTRASOUND 

## 2019-11-19 NOTE — Telephone Encounter (Signed)
Letter mailed

## 2019-11-27 ENCOUNTER — Telehealth: Payer: Self-pay | Admitting: Gastroenterology

## 2019-11-27 NOTE — Telephone Encounter (Signed)
Sorry to hear this.  

## 2019-11-27 NOTE — Telephone Encounter (Signed)
So sorry! 

## 2019-11-27 NOTE — Telephone Encounter (Signed)
REVIEWED. PLEASE SEND CARD WITH OUR CONDOLENCES.

## 2019-11-27 NOTE — Telephone Encounter (Signed)
Pt is deceased. 

## 2019-11-28 NOTE — Telephone Encounter (Signed)
Card mailed 

## 2020-07-24 IMAGING — MR MR HEAD W/O CM
9 of 10 series · 37 of 48 positions shown · non-contrast
Comparison: None.

CLINICAL DATA: Blurry vision.  Recent knee surgery.

EXAM:
MRI HEAD WITHOUT CONTRAST
TECHNIQUE: Multiplanar, multiecho pulse sequences of the brain and surrounding
structures were obtained without intravenous contrast.

[Series 3: DWI · axial · 3.0mm · 1.09mm/px · z∈[-58,+100]mm · 8 of 108 slices shown (1 of 4)]
[im 1/108]
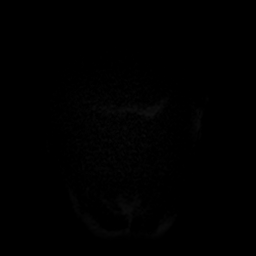
[im 12/108]
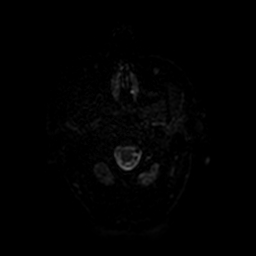
[im 36/108]
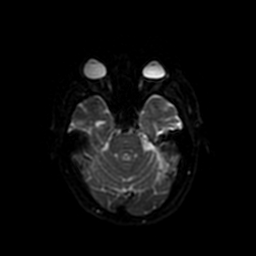
[im 48/108]
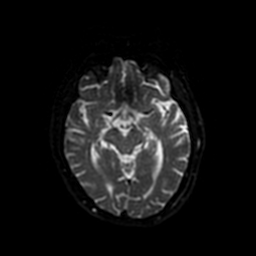
[im 60/108]
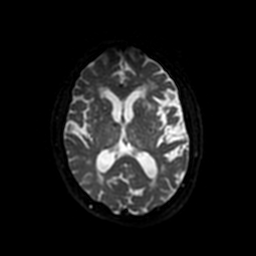
[im 72/108]
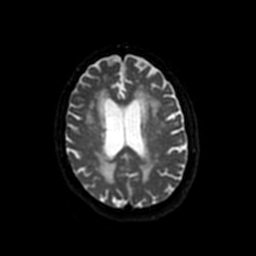
[im 96/108]
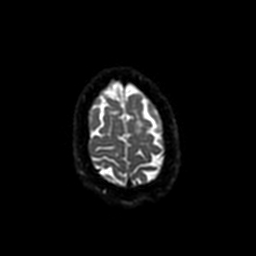
[im 108/108]
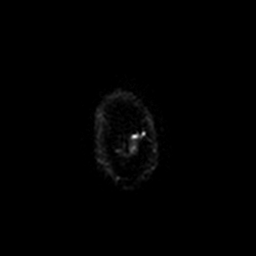

[Series 4: T1 · sagittal · 5.0mm · 0.47mm/px · 2 of 24 slices shown]
[im 1/24]
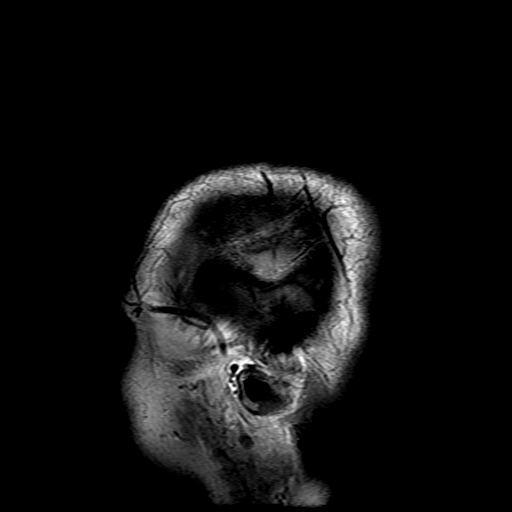
[im 24/24]
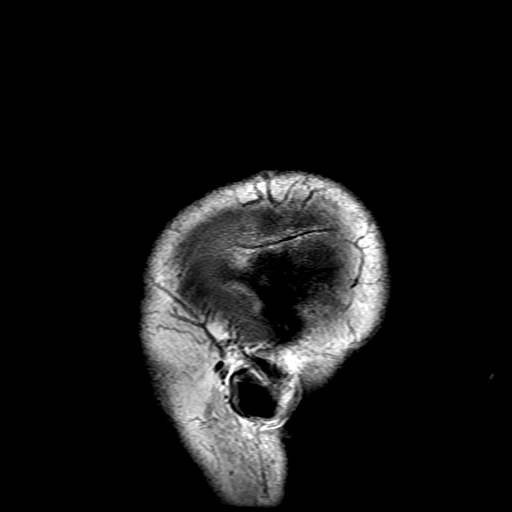

[Series 5: DWI · coronal · 3.0mm · 1.09mm/px · 8 of 108 slices shown (2 of 4)]
[im 1/108]
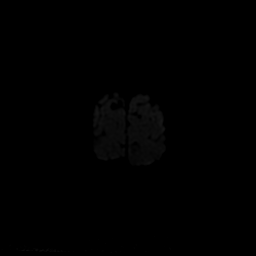
[im 12/108]
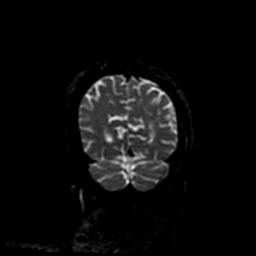
[im 36/108]
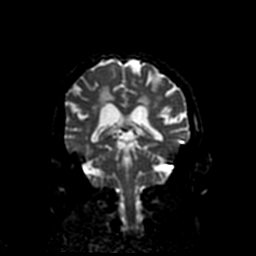
[im 48/108]
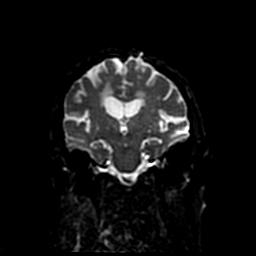
[im 60/108]
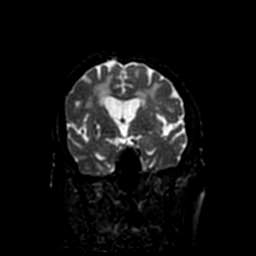
[im 72/108]
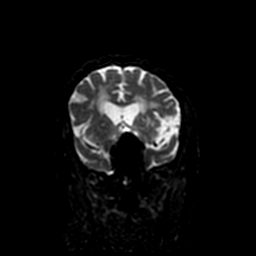
[im 96/108]
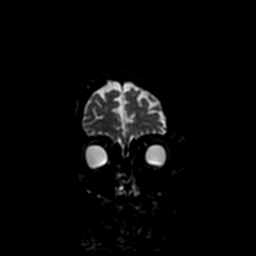
[im 108/108]
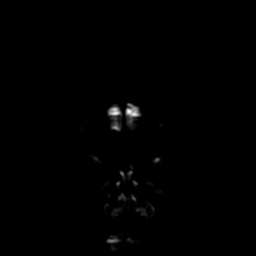

[Series 6: T2 · axial · 5.0mm · 0.43mm/px · z∈[-63,+98]mm · 3 of 28 slices shown (1 of 2)]
[im 1/28]
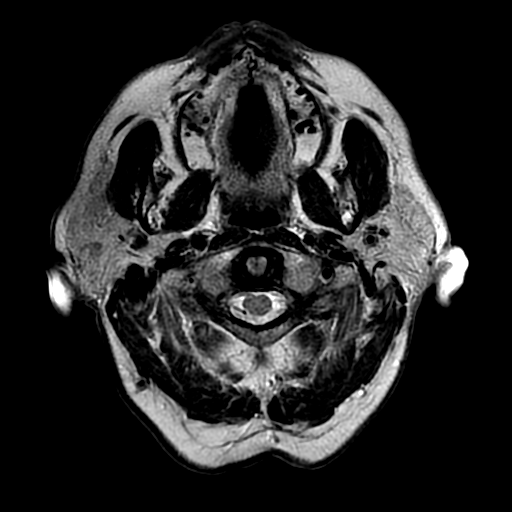
[im 14/28]
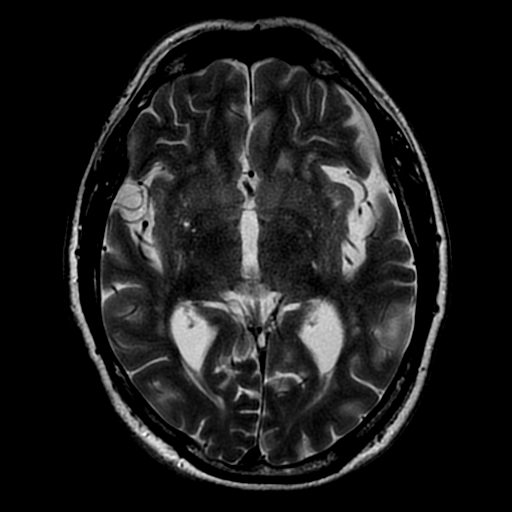
[im 28/28]
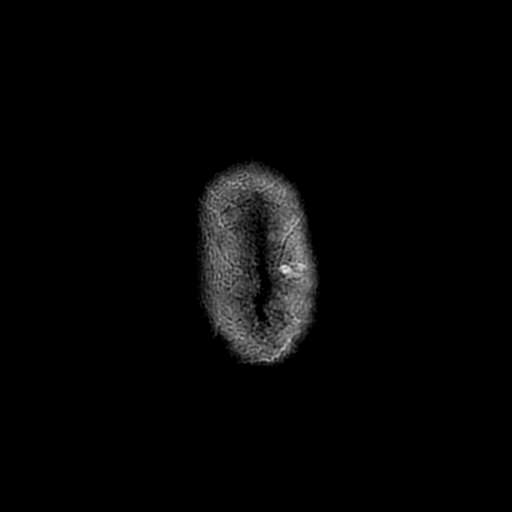

[Series 7: FLAIR · axial · 5.0mm · 0.43mm/px · z∈[-63,+98]mm · 3 of 28 slices shown]
[im 1/28]
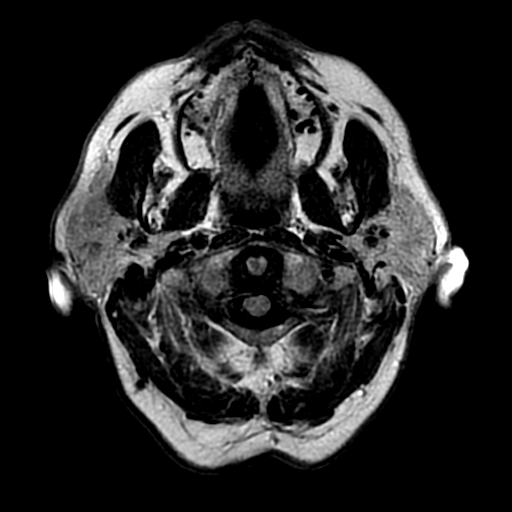
[im 14/28]
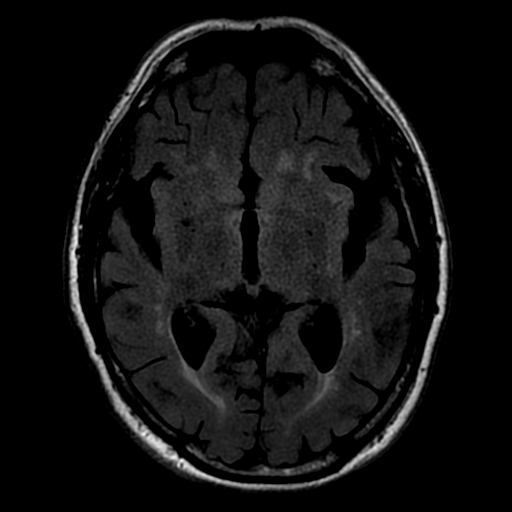
[im 28/28]
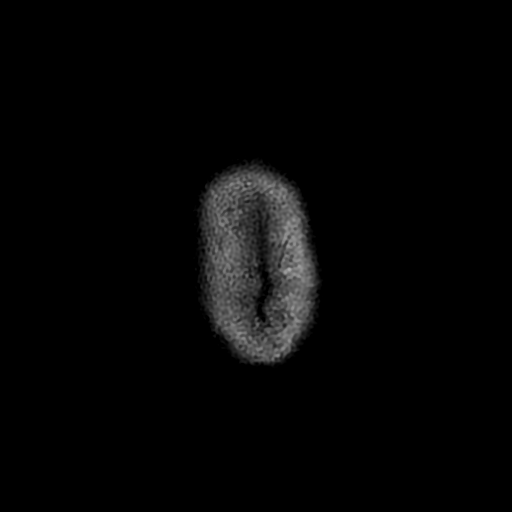

[Series 8: ax mpgr · axial · 5.0mm · 0.47mm/px · 1 of 24 slices shown]
[im 1/24]
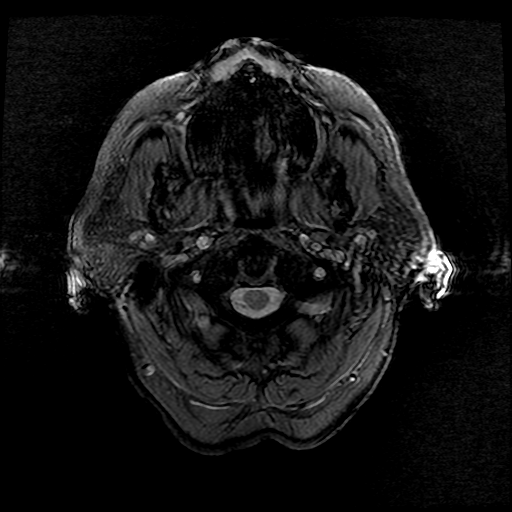

[Series 10: T2 · coronal · 5.0mm · 0.47mm/px · 2 of 24 slices shown (2 of 2)]
[im 1/24]
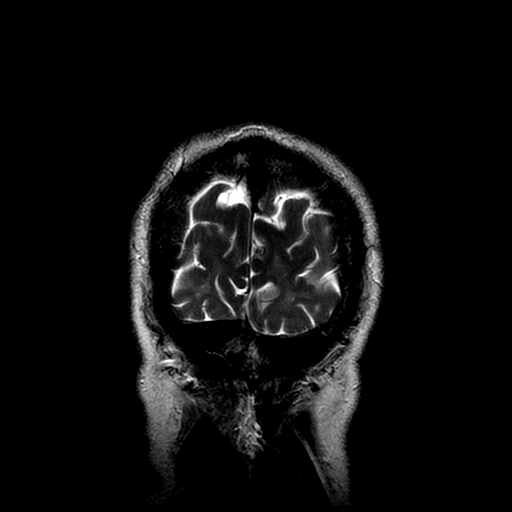
[im 24/24]
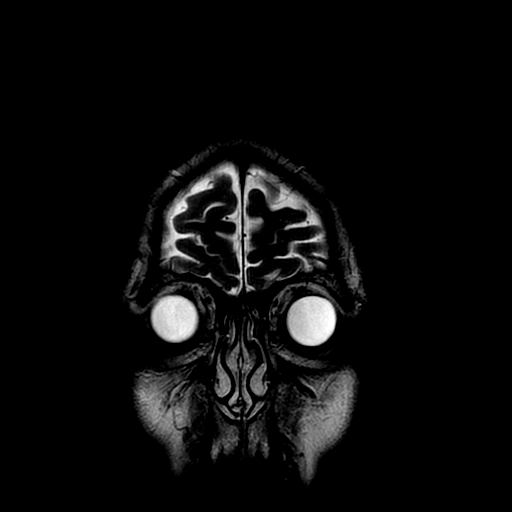

[Series 300: DWI · axial · 3.0mm · 1.09mm/px · z∈[-58,+100]mm · 5 of 54 slices shown (3 of 4)]
[im 1/54]
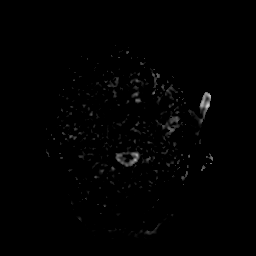
[im 14/54]
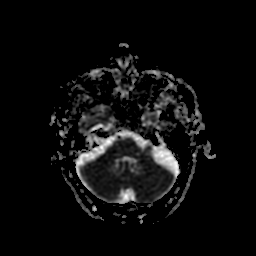
[im 27/54]
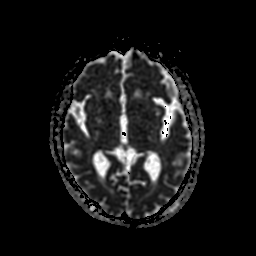
[im 40/54]
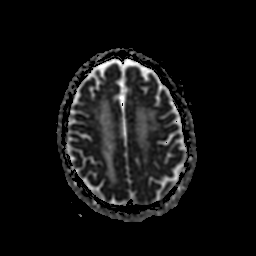
[im 54/54]
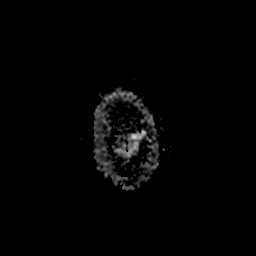

[Series 500: DWI · coronal · 3.0mm · 1.09mm/px · 5 of 54 slices shown (4 of 4)]
[im 1/54]
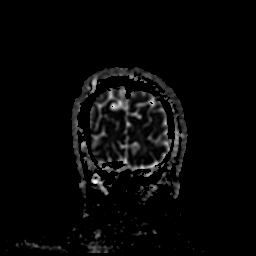
[im 14/54]
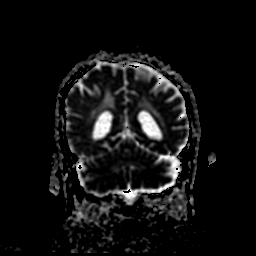
[im 27/54]
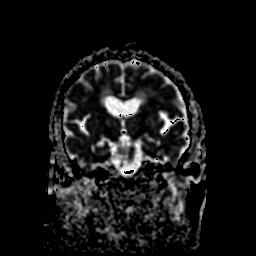
[im 40/54]
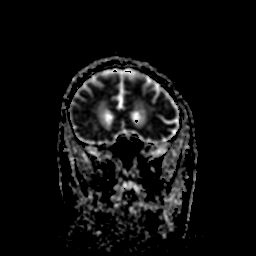
[im 54/54]
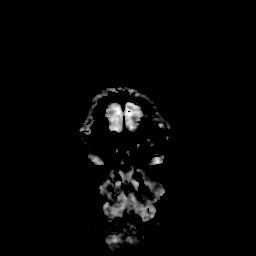

[37 of 48 positions shown; findings below may reference images not displayed]

FINDINGS: BRAIN: There is no acute infarct, acute hemorrhage, hydrocephalus or
extra-axial collection. The midline structures are normal. No
midline shift or other mass effect. There are no old infarcts.
Diffuse confluent hyperintense T2-weighted signal within the
periventricular, deep and juxtacortical white matter, most commonly
due to chronic ischemic microangiopathy. Mild volume loss. Old focus
of microhemorrhage at the ventral right thalamus.

VASCULAR: Major intracranial arterial and venous sinus flow voids
are normal.

SKULL AND UPPER CERVICAL SPINE: Calvarial bone marrow signal is
normal. There is no skull base mass. Visualized upper cervical spine
and soft tissues are normal.

SINUSES/ORBITS: No fluid levels or advanced mucosal thickening. No
mastoid or middle ear effusion. The orbits are normal.
IMPRESSION: Chronic ischemic microangiopathy without acute intracranial
abnormality.

## 2021-05-09 IMAGING — US ULTRASOUND ABDOMEN LIMITED
1 series · 14 of 25 positions shown · non-contrast
Comparison: 10/30/2018

CLINICAL DATA: Follow-up cirrhosis

EXAM:
ULTRASOUND ABDOMEN LIMITED RIGHT UPPER QUADRANT

[Series 1: ultrasound abdomen limited · 14 of 45 slices shown]
[im 1/45]
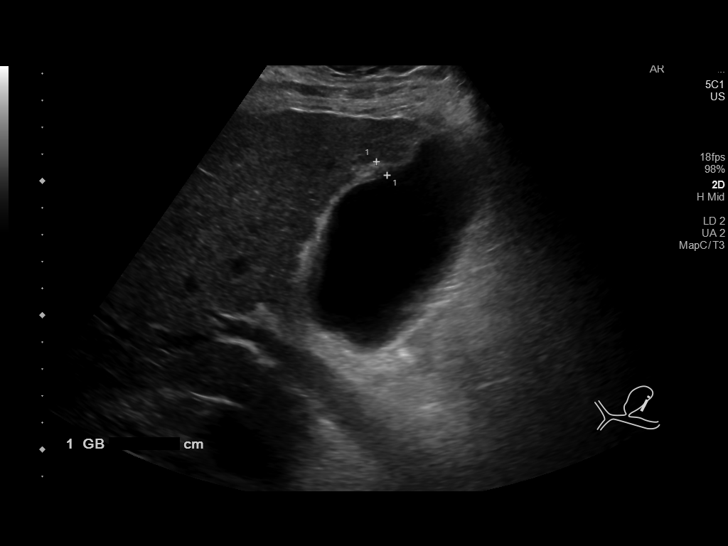
[im 4/45]
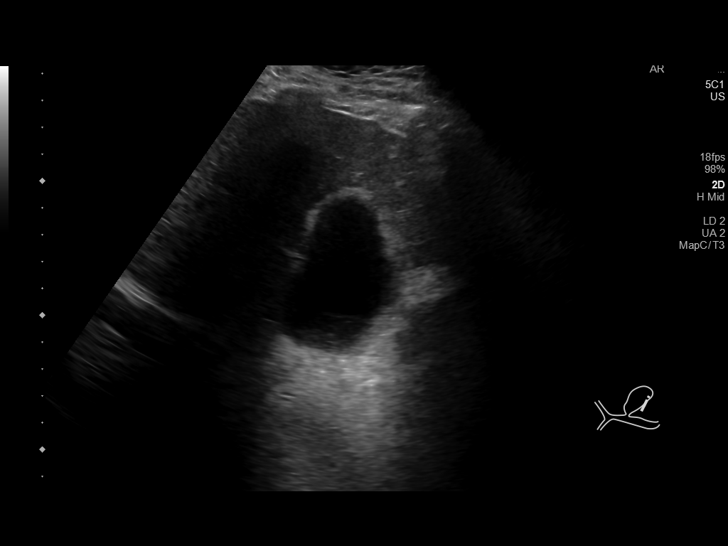
[im 8/45]
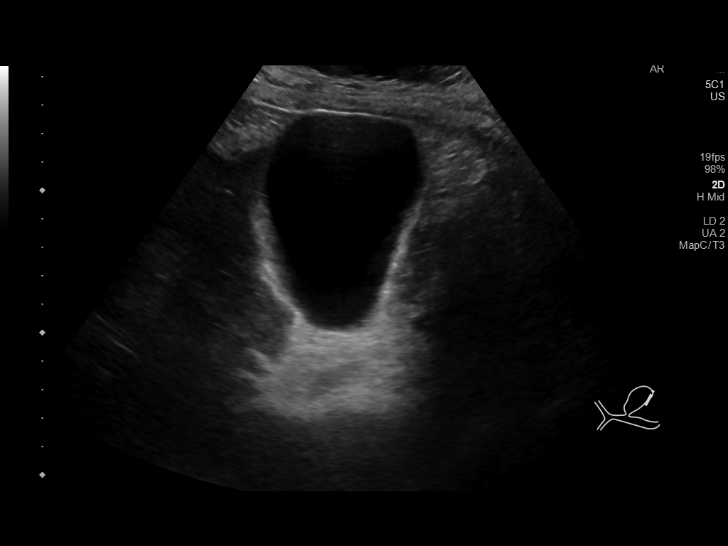
[im 12/45]
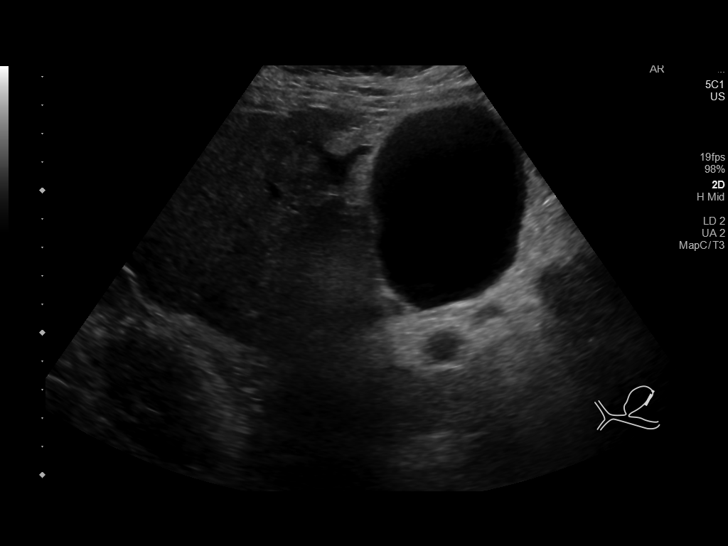
[im 15/45]
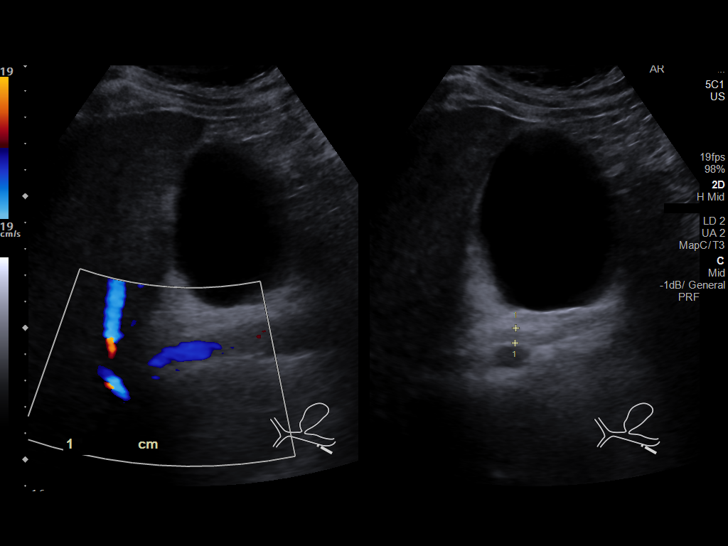
[im 17/45]
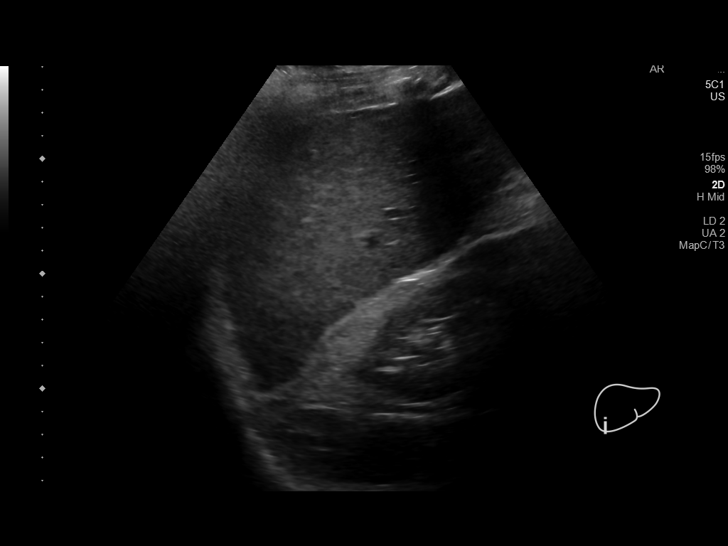
[im 21/45]
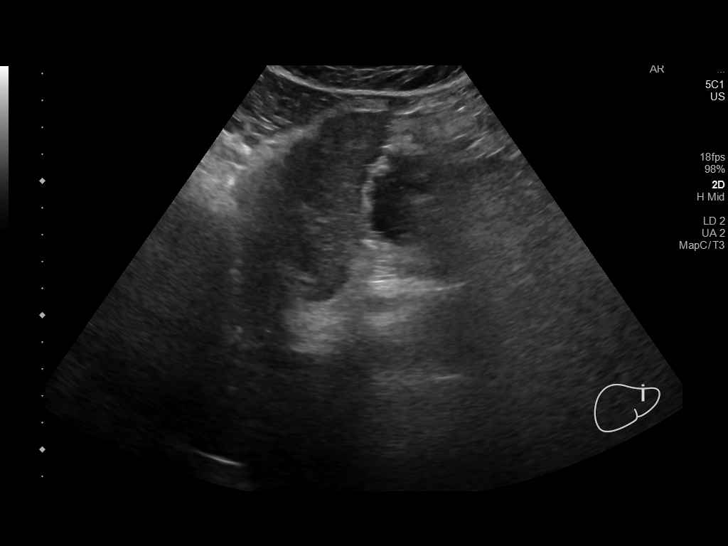
[im 24/45]
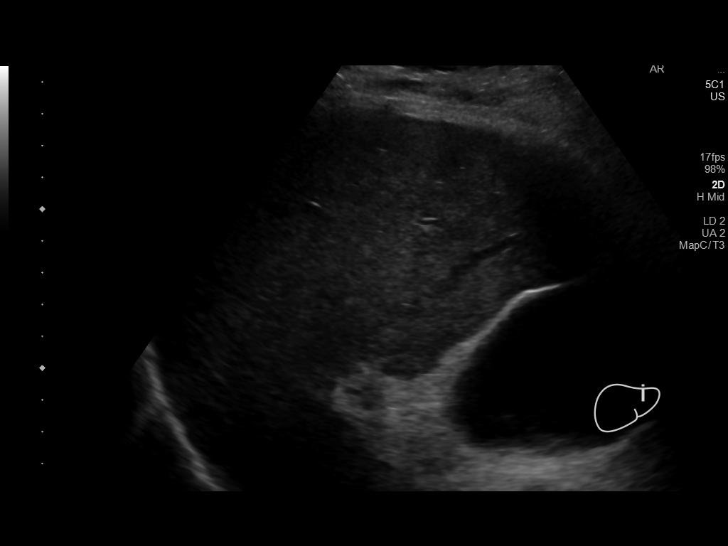
[im 28/45]
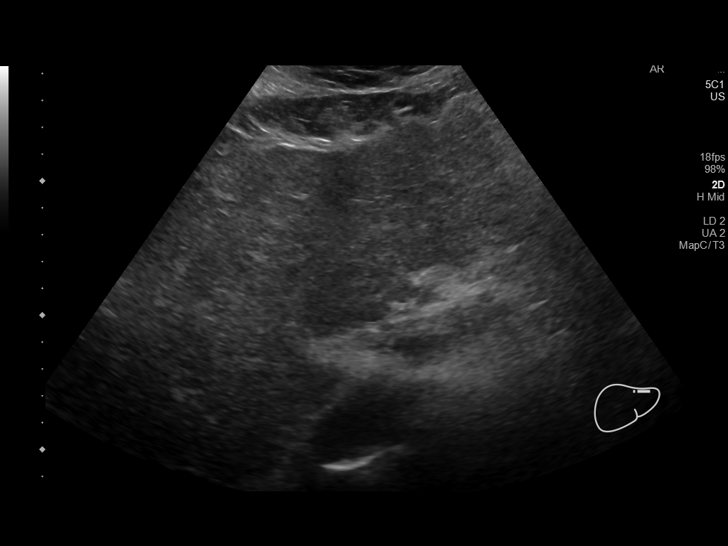
[im 30/45]
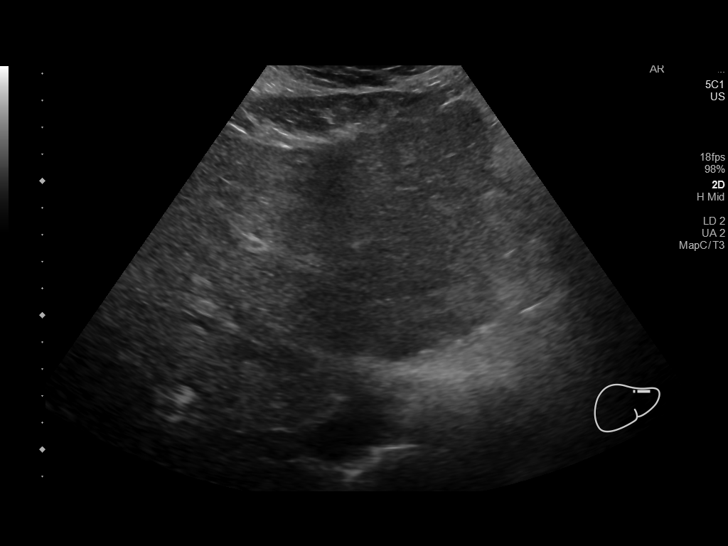
[im 34/45]
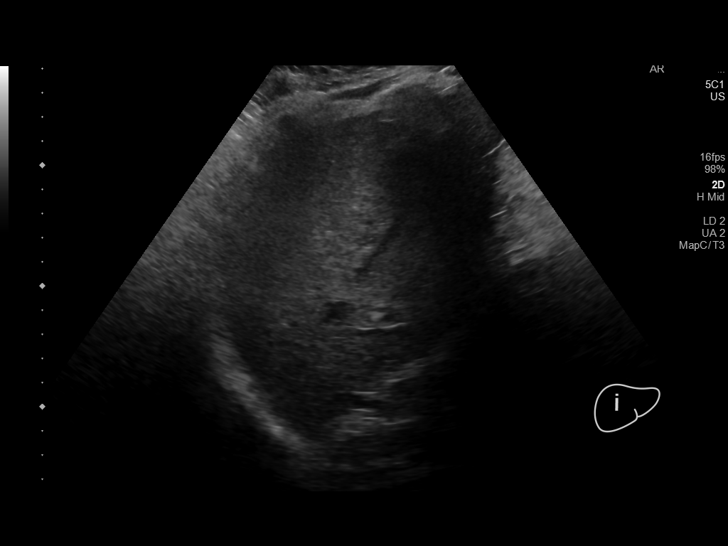
[im 37/45]
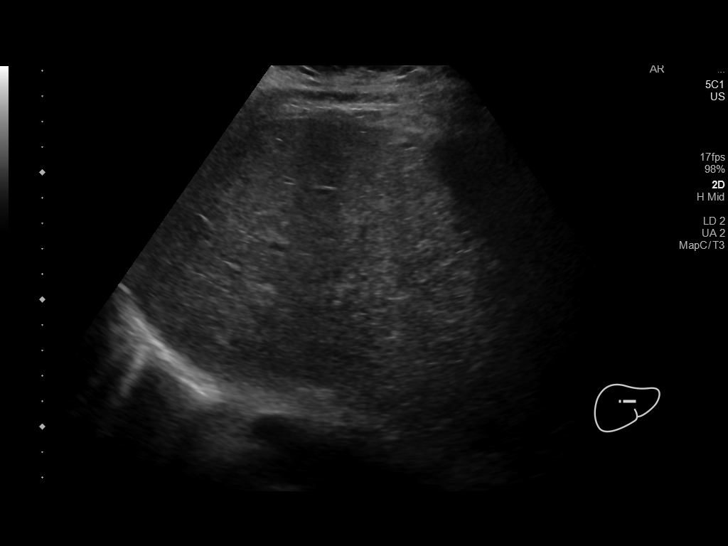
[im 41/45]
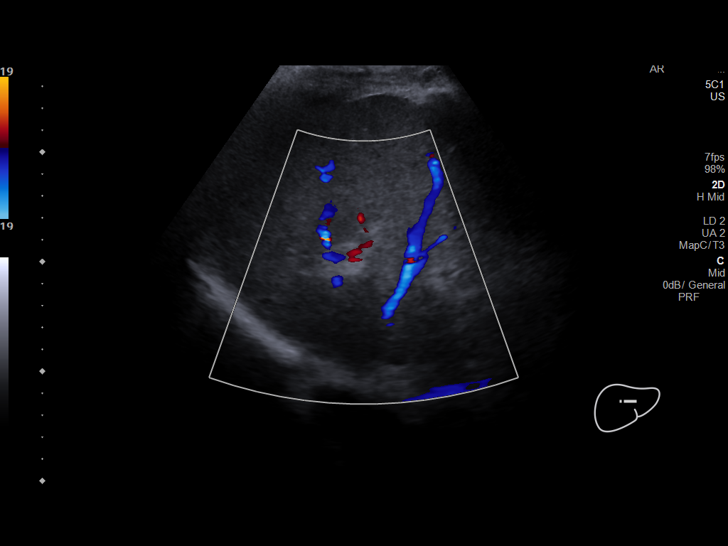
[im 45/45]
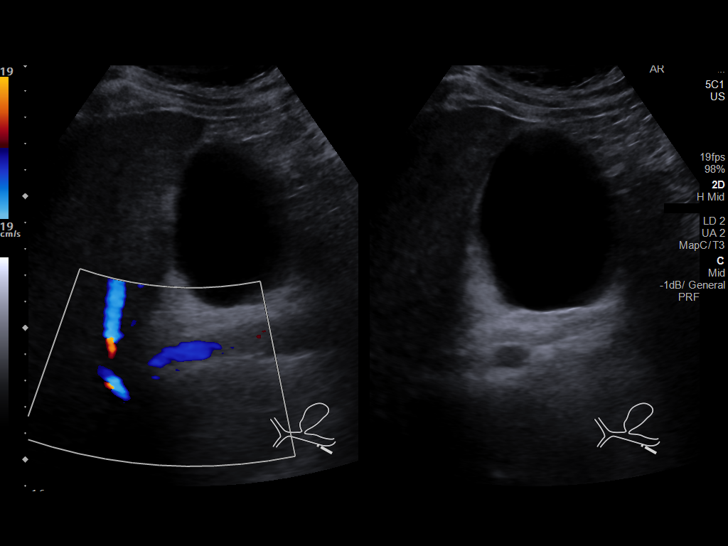

[14 of 25 positions shown; findings below may reference images not displayed]

FINDINGS: Gallbladder:

Gallbladder is well distended without evidence of cholelithiasis.
Mild gallbladder sludge is noted. Negative sonographic Murphy's sign
is noted. Stable gallbladder wall thickening is noted.

Common bile duct:

Diameter: 5 mm

Liver:

Liver demonstrates lobular margin consistent with underlying
cirrhosis. No focal mass is seen. Portal vein is patent on color
Doppler imaging with normal direction of blood flow towards the
liver.

Other: Small amount of perihepatic fluid is noted.
IMPRESSION: Changes consistent with underlying cirrhosis.

Minimal perihepatic fluid.

Mild gallbladder wall thickening stable from the prior exam.
